# Patient Record
Sex: Female | Born: 1940
Health system: Southern US, Community
[De-identification: ages and names within clinical notes are randomized; demographics above are authoritative.]

## PROBLEM LIST (undated history)

## (undated) DIAGNOSIS — Z86010 Personal history of colonic polyps: Secondary | ICD-10-CM

## (undated) DIAGNOSIS — Z889 Allergy status to unspecified drugs, medicaments and biological substances status: Secondary | ICD-10-CM

## (undated) DIAGNOSIS — M1990 Unspecified osteoarthritis, unspecified site: Secondary | ICD-10-CM

## (undated) DIAGNOSIS — D649 Anemia, unspecified: Secondary | ICD-10-CM

## (undated) DIAGNOSIS — IMO0001 Reserved for inherently not codable concepts without codable children: Secondary | ICD-10-CM

## (undated) DIAGNOSIS — Z5189 Encounter for other specified aftercare: Secondary | ICD-10-CM

## (undated) DIAGNOSIS — I10 Essential (primary) hypertension: Secondary | ICD-10-CM

## (undated) DIAGNOSIS — G250 Essential tremor: Secondary | ICD-10-CM

## (undated) DIAGNOSIS — H409 Unspecified glaucoma: Secondary | ICD-10-CM

## (undated) DIAGNOSIS — R251 Tremor, unspecified: Secondary | ICD-10-CM

## (undated) HISTORY — PX: CHOLECYSTECTOMY: SHX55

## (undated) HISTORY — DX: Personal history of colonic polyps: Z86.010

## (undated) HISTORY — DX: Unspecified glaucoma: H40.9

## (undated) HISTORY — DX: Encounter for other specified aftercare: Z51.89

## (undated) HISTORY — DX: Essential tremor: G25.0

## (undated) HISTORY — PX: JOINT REPLACEMENT: SHX530

## (undated) HISTORY — PX: ABDOMINAL HYSTERECTOMY: SHX81

## (undated) HISTORY — PX: OTHER SURGICAL HISTORY: SHX169

## (undated) HISTORY — PX: COLONOSCOPY: SHX174

---

## 1993-07-08 HISTORY — PX: CERVICAL LAMINECTOMY: SHX94

## 1998-01-13 ENCOUNTER — Ambulatory Visit (HOSPITAL_COMMUNITY): Admission: RE | Admit: 1998-01-13 | Discharge: 1998-01-13 | Payer: Self-pay | Admitting: Family Medicine

## 1998-03-10 ENCOUNTER — Encounter: Payer: Self-pay | Admitting: Emergency Medicine

## 1998-03-10 ENCOUNTER — Emergency Department (HOSPITAL_COMMUNITY): Admission: EM | Admit: 1998-03-10 | Discharge: 1998-03-10 | Payer: Self-pay | Admitting: Emergency Medicine

## 1998-05-04 ENCOUNTER — Ambulatory Visit (HOSPITAL_COMMUNITY): Admission: RE | Admit: 1998-05-04 | Discharge: 1998-05-04 | Payer: Self-pay | Admitting: Family Medicine

## 1998-06-05 ENCOUNTER — Other Ambulatory Visit: Admission: RE | Admit: 1998-06-05 | Discharge: 1998-06-05 | Payer: Self-pay | Admitting: *Deleted

## 1999-06-06 ENCOUNTER — Other Ambulatory Visit: Admission: RE | Admit: 1999-06-06 | Discharge: 1999-06-06 | Payer: Self-pay | Admitting: *Deleted

## 1999-10-12 ENCOUNTER — Encounter: Admission: RE | Admit: 1999-10-12 | Discharge: 1999-10-12 | Payer: Self-pay | Admitting: Family Medicine

## 1999-10-12 ENCOUNTER — Encounter: Payer: Self-pay | Admitting: Family Medicine

## 1999-10-13 ENCOUNTER — Emergency Department (HOSPITAL_COMMUNITY): Admission: EM | Admit: 1999-10-13 | Discharge: 1999-10-14 | Payer: Self-pay | Admitting: Emergency Medicine

## 2000-06-09 ENCOUNTER — Other Ambulatory Visit: Admission: RE | Admit: 2000-06-09 | Discharge: 2000-06-09 | Payer: Self-pay | Admitting: *Deleted

## 2000-09-07 ENCOUNTER — Emergency Department (HOSPITAL_COMMUNITY): Admission: EM | Admit: 2000-09-07 | Discharge: 2000-09-07 | Payer: Self-pay | Admitting: Emergency Medicine

## 2000-10-15 ENCOUNTER — Encounter: Admission: RE | Admit: 2000-10-15 | Discharge: 2000-10-15 | Payer: Self-pay | Admitting: *Deleted

## 2000-10-15 ENCOUNTER — Encounter: Payer: Self-pay | Admitting: *Deleted

## 2001-07-13 ENCOUNTER — Other Ambulatory Visit: Admission: RE | Admit: 2001-07-13 | Discharge: 2001-07-13 | Payer: Self-pay | Admitting: *Deleted

## 2001-10-16 ENCOUNTER — Encounter: Admission: RE | Admit: 2001-10-16 | Discharge: 2001-10-16 | Payer: Self-pay | Admitting: *Deleted

## 2001-10-16 ENCOUNTER — Encounter: Payer: Self-pay | Admitting: *Deleted

## 2002-07-20 ENCOUNTER — Other Ambulatory Visit: Admission: RE | Admit: 2002-07-20 | Discharge: 2002-07-20 | Payer: Self-pay | Admitting: *Deleted

## 2002-10-21 ENCOUNTER — Encounter: Payer: Self-pay | Admitting: *Deleted

## 2002-10-21 ENCOUNTER — Encounter: Admission: RE | Admit: 2002-10-21 | Discharge: 2002-10-21 | Payer: Self-pay | Admitting: *Deleted

## 2003-08-03 ENCOUNTER — Other Ambulatory Visit: Admission: RE | Admit: 2003-08-03 | Discharge: 2003-08-03 | Payer: Self-pay | Admitting: *Deleted

## 2003-10-25 ENCOUNTER — Encounter: Admission: RE | Admit: 2003-10-25 | Discharge: 2003-10-25 | Payer: Self-pay | Admitting: *Deleted

## 2004-07-12 ENCOUNTER — Ambulatory Visit: Payer: Self-pay | Admitting: Internal Medicine

## 2004-07-23 ENCOUNTER — Other Ambulatory Visit: Admission: RE | Admit: 2004-07-23 | Discharge: 2004-07-23 | Payer: Self-pay | Admitting: *Deleted

## 2004-08-01 ENCOUNTER — Ambulatory Visit: Payer: Self-pay | Admitting: Internal Medicine

## 2004-10-25 ENCOUNTER — Encounter: Admission: RE | Admit: 2004-10-25 | Discharge: 2004-10-25 | Payer: Self-pay | Admitting: *Deleted

## 2005-01-16 ENCOUNTER — Inpatient Hospital Stay (HOSPITAL_COMMUNITY): Admission: RE | Admit: 2005-01-16 | Discharge: 2005-01-20 | Payer: Self-pay | Admitting: Orthopedic Surgery

## 2005-07-31 ENCOUNTER — Other Ambulatory Visit: Admission: RE | Admit: 2005-07-31 | Discharge: 2005-07-31 | Payer: Self-pay | Admitting: *Deleted

## 2005-09-23 ENCOUNTER — Encounter: Admission: RE | Admit: 2005-09-23 | Discharge: 2005-09-23 | Payer: Self-pay | Admitting: Orthopedic Surgery

## 2005-10-28 ENCOUNTER — Encounter: Admission: RE | Admit: 2005-10-28 | Discharge: 2005-10-28 | Payer: Self-pay | Admitting: *Deleted

## 2006-10-31 ENCOUNTER — Encounter: Admission: RE | Admit: 2006-10-31 | Discharge: 2006-10-31 | Payer: Self-pay | Admitting: *Deleted

## 2007-11-03 ENCOUNTER — Encounter: Admission: RE | Admit: 2007-11-03 | Discharge: 2007-11-03 | Payer: Self-pay | Admitting: *Deleted

## 2008-11-03 ENCOUNTER — Encounter: Admission: RE | Admit: 2008-11-03 | Discharge: 2008-11-03 | Payer: Self-pay | Admitting: Internal Medicine

## 2009-11-06 ENCOUNTER — Encounter: Admission: RE | Admit: 2009-11-06 | Discharge: 2009-11-06 | Payer: Self-pay | Admitting: Internal Medicine

## 2010-01-22 ENCOUNTER — Inpatient Hospital Stay (HOSPITAL_COMMUNITY): Admission: RE | Admit: 2010-01-22 | Discharge: 2010-01-26 | Payer: Self-pay | Admitting: Orthopedic Surgery

## 2010-09-22 LAB — CBC
HCT: 25.8 % — ABNORMAL LOW (ref 36.0–46.0)
HCT: 26.4 % — ABNORMAL LOW (ref 36.0–46.0)
HCT: 28.6 % — ABNORMAL LOW (ref 36.0–46.0)
Hemoglobin: 8.3 g/dL — ABNORMAL LOW (ref 12.0–15.0)
Hemoglobin: 8.8 g/dL — ABNORMAL LOW (ref 12.0–15.0)
MCH: 23.6 pg — ABNORMAL LOW (ref 26.0–34.0)
MCH: 26.2 pg (ref 26.0–34.0)
MCHC: 32.3 g/dL (ref 30.0–36.0)
MCV: 77.7 fL — ABNORMAL LOW (ref 78.0–100.0)
Platelets: 160 10*3/uL (ref 150–400)
RBC: 3.68 MIL/uL — ABNORMAL LOW (ref 3.87–5.11)
RBC: 4.18 MIL/uL (ref 3.87–5.11)
RDW: 15.4 % (ref 11.5–15.5)
RDW: 18.9 % — ABNORMAL HIGH (ref 11.5–15.5)
RDW: 20 % — ABNORMAL HIGH (ref 11.5–15.5)
WBC: 10.8 10*3/uL — ABNORMAL HIGH (ref 4.0–10.5)
WBC: 14.2 10*3/uL — ABNORMAL HIGH (ref 4.0–10.5)
WBC: 15.4 10*3/uL — ABNORMAL HIGH (ref 4.0–10.5)

## 2010-09-22 LAB — TYPE AND SCREEN

## 2010-09-22 LAB — BASIC METABOLIC PANEL
BUN: 11 mg/dL (ref 6–23)
CO2: 30 mEq/L (ref 19–32)
Chloride: 101 mEq/L (ref 96–112)
Chloride: 98 mEq/L (ref 96–112)
Creatinine, Ser: 1.34 mg/dL — ABNORMAL HIGH (ref 0.4–1.2)
GFR calc Af Amer: 47 mL/min — ABNORMAL LOW (ref >60)
GFR calc Af Amer: 52 mL/min — ABNORMAL LOW (ref >60)
GFR calc non Af Amer: 39 mL/min — ABNORMAL LOW (ref >60)
GFR calc non Af Amer: 43 mL/min — ABNORMAL LOW (ref >60)
Glucose, Bld: 149 mg/dL — ABNORMAL HIGH (ref 70–99)
Potassium: 3.7 mEq/L (ref 3.5–5.1)
Potassium: 4.2 mEq/L (ref 3.5–5.1)

## 2010-09-22 LAB — PROTIME-INR
INR: 1.07 (ref 0.00–1.49)
INR: 2.95 — ABNORMAL HIGH (ref 0.00–1.49)
Prothrombin Time: 20.7 seconds — ABNORMAL HIGH (ref 11.6–15.2)
Prothrombin Time: 30.5 seconds — ABNORMAL HIGH (ref 11.6–15.2)

## 2010-09-22 LAB — URINALYSIS, ROUTINE W REFLEX MICROSCOPIC
Glucose, UA: NEGATIVE mg/dL
Hgb urine dipstick: NEGATIVE
Protein, ur: NEGATIVE mg/dL
pH: 6.5 (ref 5.0–8.0)

## 2010-09-22 LAB — URINE CULTURE: Special Requests: NEGATIVE

## 2010-09-23 LAB — URINALYSIS, ROUTINE W REFLEX MICROSCOPIC
Glucose, UA: NEGATIVE mg/dL
Hgb urine dipstick: NEGATIVE
Ketones, ur: NEGATIVE mg/dL
Protein, ur: NEGATIVE mg/dL
Urobilinogen, UA: 0.2 mg/dL (ref 0.0–1.0)

## 2010-09-23 LAB — CBC
HCT: 35 % — ABNORMAL LOW (ref 36.0–46.0)
MCHC: 32.3 g/dL (ref 30.0–36.0)
Platelets: 230 10*3/uL (ref 150–400)
RDW: 16.1 % — ABNORMAL HIGH (ref 11.5–15.5)
WBC: 6.2 10*3/uL (ref 4.0–10.5)

## 2010-09-23 LAB — COMPREHENSIVE METABOLIC PANEL
ALT: 35 U/L (ref 0–35)
Albumin: 3.9 g/dL (ref 3.5–5.2)
Alkaline Phosphatase: 55 U/L (ref 39–117)
Calcium: 9.1 mg/dL (ref 8.4–10.5)
GFR calc Af Amer: 52 mL/min — ABNORMAL LOW (ref >60)
Glucose, Bld: 106 mg/dL — ABNORMAL HIGH (ref 70–99)
Potassium: 3.9 mEq/L (ref 3.5–5.1)
Sodium: 141 mEq/L (ref 135–145)
Total Protein: 7.5 g/dL (ref 6.0–8.3)

## 2010-09-23 LAB — SURGICAL PCR SCREEN
MRSA, PCR: NEGATIVE
Staphylococcus aureus: NEGATIVE

## 2010-09-23 LAB — PROTIME-INR: Prothrombin Time: 13 seconds (ref 11.6–15.2)

## 2010-10-16 ENCOUNTER — Other Ambulatory Visit: Payer: Self-pay | Admitting: Obstetrics and Gynecology

## 2010-10-16 DIAGNOSIS — Z1231 Encounter for screening mammogram for malignant neoplasm of breast: Secondary | ICD-10-CM

## 2010-11-08 ENCOUNTER — Ambulatory Visit
Admission: RE | Admit: 2010-11-08 | Discharge: 2010-11-08 | Disposition: A | Discharge status: Home / Self Care | Payer: BC Managed Care – PPO | Source: Ambulatory Visit | Attending: Obstetrics and Gynecology | Admitting: Obstetrics and Gynecology

## 2010-11-08 DIAGNOSIS — Z1231 Encounter for screening mammogram for malignant neoplasm of breast: Secondary | ICD-10-CM

## 2010-11-23 NOTE — H&P (Signed)
NAMEJAZYIAH, Jenna Howe              ACCOUNT NO.:  [id number]   MEDICAL RECORD NO.:  [id number]          PATIENT TYPE:  INP   LOCATION:  NA                           FACILITY:  [facility]   PHYSICIAN:  Ollen Gross, M.D.    DATE OF BIRTH:  1941/07/08   DATE OF ADMISSION:  01/16/2005  DATE OF DISCHARGE:                                HISTORY & PHYSICAL   DATE OF OFFICE VISIT HISTORY AND PHYSICAL:  January 03, 2005.   CHIEF COMPLAINT:  Left knee pain.   HISTORY OF PRESENT ILLNESS:  A 70 year old female with a longstanding  history of left knee problems which have been much more progressive since  December of this past year. She is having much more pain and limiting her  activities and mobility. She had a previous scope years ago by Dr. Chaney Malling  and recently seen Dr. Sherlean Foot and undergone a knee injection which only  provided temporary relief. She follows up with Dr. Lequita Halt, found to have  severe end-stage tricompartmental changes throughout the knee. It is felt  she could benefit from a knee replacement. The risks and benefits have been  discussed. The patient subsequently admitted to the hospital. She has been  seen by Dr. Margrett Rud, felt to be in average health with an average  surgical risk. The patient subsequently admitted to the hospital.   ALLERGIES:  PENICILLIN, SULFA, CELEBREX.   CURRENT MEDICATIONS:  1.  Premarin 0.625 mg daily.  2.  Triamterene hydrochlorothiazide 75/50 daily.  3.  Diclofenac 50 mg, 1-2 a day.  4.  Travatan opthalmic solution drops daily.  5.  Loratadine 10 mg daily.  6.  Also on an iron supplement.   PAST MEDICAL HISTORY:  1.  Glaucoma.  2.  Lower extremity dependent edema.  3.  Arthritis.   PAST SURGICAL HISTORY:  Vaginal hysterectomy in 1988, laparoscopic with  fulguration of pelvis. Cervical laminectomy diskectomy 1988, gallbladder  surgery in 1990's, left knee scope in the 80's.   SOCIAL HISTORY:  Married, retired, nonsmoker, no alcohol, one  daughter. Two  steps entering into her home.   FAMILY HISTORY:  Mother deceased age 60 with a history of hypertension,  stroke, diabetes.   REVIEW OF SYMPTOMS:  GENERAL:  No fever, chills, night sweats. NEURO:  No  seizure, syncope or paralysis. Does have glaucoma. RESPIRATORY:  No  shortness of breath, productive cough or hemoptysis. CARDIOVASCULAR:  No  chest pain, angina, orthopnea. GI:  No nausea, vomiting, diarrhea,  constipation. GU:  No dysuria, hematuria or discharge . MUSCULOSKELETAL:  Left knee with arthritis and lower extremity edema.   PHYSICAL EXAMINATION:  VITAL SIGNS:  Pulse 68, respirations 12, blood  pressure 168/78.  GENERAL:  A 70 year old African-American female, well-nourished, well-  developed, in no acute distress accompanied by her husband. Alert and  oriented in cooperative, very pleasant.  HEENT:  Normocephalic, atraumatic. Pupils round and reactive. Oropharynx  clear. EOM's intact. She does have upper partial denture plate noted.  NECK:  Supple, no carotid bruits.  CHEST:  Clear anterior and posterior chest.  LUNGS:  No rhonchi, rales or  wheezes.  HEART:  Regular rate and rhythm. No murmur.  ABDOMEN:  Soft, nontender, bowel sounds present.  RECTAL/BREASTS/GENITALIA:  Not done not pertinent to present illness.  EXTREMITIES:  Left knee. Range of motion 5-90 degrees, moderate crepitus  noted, no effusion, no instability.   IMPRESSION:  1.  Osteoarthritis, left knee.  2.  Glaucoma.  3.  Lower extremity swelling.   PLAN:  The patient admitted to [facility] to undergo left total  knee arthroplasty. The surgery will be performed by Dr. Ollen Gross. Her  medical doctor is Dr. Andrey Campanile who will be notified of her room on admission  and be contacted if needed for medical assistance with the patient  throughout the hospital course.       ALP/MEDQ  D:  01/16/2005  T:  01/16/2005  Job:  [id number]   cc:   Vale [place]. Andrey Campanile, M.D.  [address]  [place]  [place] [address]  Fax: [phone]

## 2010-11-23 NOTE — Op Note (Signed)
NAMERUTHIE, Howe NO.:  [id number]   MEDICAL RECORD NO.:  [id number]          PATIENT TYPE:  INP   LOCATION:  0011                         FACILITY:  [facility]   PHYSICIAN:  Ollen Gross, M.D.    DATE OF BIRTH:  1940-11-04   DATE OF PROCEDURE:  01/16/2005  DATE OF DISCHARGE:                                 OPERATIVE REPORT   PREOPERATIVE DIAGNOSIS:  Osteoarthritis left knee.   POSTOPERATIVE DIAGNOSIS:  Osteoarthritis left knee.   PROCEDURE:  Left total knee arthroplasty.   SURGEON:  Ollen Gross, M.D.   ASSISTANT:  Avel Peace, P.A.-C.   ANESTHESIA:  Spinal.   ESTIMATED BLOOD LOSS:  Minimal.   DRAINS:  Hemovac x1.   COMPLICATIONS:  None.   TOURNIQUET TIME:  37 minutes at 300 mmHg.   CONDITION:  Stable to recovery.   BRIEF CLINICAL NOTE:  Jenna Howe is a 70 year old female with end-stage  arthritic change of the left knee with intractable pain. She has failed  nonoperative management and presents now for left total knee arthroplasty.   PROCEDURE IN DETAIL:  After successful administration of spinal anesthetic,  a tourniquet was placed high on the left thigh and left lower extremity  prepped and draped in the usual sterile fashion. Extremities wrapped in  Esmarch, knee flexed, and tourniquet inflated to 300 mmHg. A standard  midline incision was made with a 10 blade through the subcutaneous tissue to  the level of the extensor mechanism. A fresh blade was used to make a medial  parapatellar arthrotomy and then the soft tissue over the proximal medial  tibia subperiosteally elevated to the joint line with a knife and into the  semimembranosus bursa with a Cobb elevator.  The soft tissue over the proximal and lateral tibia is also elevated with  attention being paid to avoiding the patellar tendon on the tibial tubercle.  The patella is everted, knee flexed 90 degrees, and ACL and PCL removed. A  drill was used to create a starting hole in the  distal femur, canal was  irrigated. A 5 degree left valgus alignment guide is placed and rotating off  the posterior condyles rotation is marked and a block pinned to remove 10 mm  off the distal femur. Distal femoral resection is made with an oscillating  saw. A sizing block is placed and size four is most appropriate for the  femoral component. Rotations marked at the epicondylar axis and the size  four cutting block is placed. The anterior, posterior and chamfer cuts are  subsequently made.   The tibia is subluxed forward and the menisci are removed. Extramedullary  tibial alignment guide is placed referencing proximally at the medial aspect  of tibial tubercle and distally along the second metatarsal axis and tibial  crest. The block is pinned to remove 10 mm off the nondeficient lateral  side. Tibial resection is made with an oscillating saw. A size three is the  most trocar appropriate tibial component and then the proximal tibia is  prepared with the modular drill and keel punch for a size three. Femoral  preparation  is completed with the intercondylar cut for the size four.   A size three mobile bearing tibial trial with a size four posterior  stabilized femoral trial and a 10 mm posterior stabilized rotating platform  insert trial are placed. With the 10, full extension was achieved with  excellent varus and valgus balance throughout full range of motion. The  patella is then everted and the thickness measured to be 24 mm. Freehand  resection is taken to 15 mm, a 38 template is placed, lug holes were  drilled, trial patella was placed and it tracks normally. Osteophytes are  then removed off the posterior femur with the trial in place. All trials  were removed and the cut bone surfaces are prepared with pulsatile lavage.  Cement is mixed and once ready for implantation, the size three mobile  bearing tibial tray, size four posterior stabilized femur and 38 patella are  cemented  into place and the patella is held with a clamp. A trial 10 mm  insert is placed, knee held in full extension and all extruded cement  removed. Once the cement is fully hardened then the permanent 10 mm  posterior stabilized rotating platform insert is placed into the tibial  tray. The wound is copiously irrigated with saline solution and the extensor  mechanism closed over a Hemovac drain with interrupted #1 PDS. The  tourniquet was released with a total time of 38 minutes. Flexion against  gravity is about 110 degrees. This is far greater than her preop flexion of  85 degrees. The subcu is closed with interrupted 2-0 Vicryl and subcuticular  running 4-0 Monocryl. Incision is cleaned and dried and Steri-Strips  applied. Drains hooked to suction and bulky sterile dressing is applied and  she is placed into a knee immobilizer, awakened and transported to recovery  in stable condition.       FA/MEDQ  D:  01/16/2005  T:  01/17/2005  Job:  [id number]

## 2010-11-23 NOTE — Discharge Summary (Signed)
Jenna Howe, Jenna Howe              ACCOUNT NO.:  [id number]   MEDICAL RECORD NO.:  [id number]          PATIENT TYPE:  INP   LOCATION:  1608                         FACILITY:  [facility]   PHYSICIAN:  Ollen Gross, M.D.    DATE OF BIRTH:  05-Oct-1940   DATE OF ADMISSION:  01/16/2005  DATE OF DISCHARGE:  01/20/2005                                 DISCHARGE SUMMARY   ADMISSION DIAGNOSES:  1.  Osteoarthritis left knee.  2.  Glaucoma.  3.  Lower extremity swelling.   DISCHARGE DIAGNOSES:  1.  Osteoarthritis left knee status post left total knee arthroplasty.  2.  Mild postoperative blood loss anemia, did not require transfusion.  3.  Glaucoma.  4.  Lower extremity swelling.  5.  Postoperative hypokalemia, improved.   PROCEDURE:  January 16, 2005 left total knee arthroplasty, surgeon Ollen Gross, M.D., assistant Avel Peace, P.A.-C.  Spinal anesthesia, minimal  blood loss, hemovac drain x1. Tourniquet time 37 minutes at 300 mmHg.   CONSULTATIONS:  None.   BRIEF HISTORY:  Jenna Howe is a 70 year old female with end-stage arthritis  of the left knee with intractable pain who failed nonoperative management  and now presents for total knee arthroplasty.   LABORATORY DATA:  CBC on admission showed a hemoglobin of 11.3, hematocrit  of 34.8, white cell count of 6.3, red cell count 4.8, and differential  within normal limits. Postoperative hemoglobin 9.4 and drifted down to 8.5,  last noted 8.2 and 24.5. PT and PTT 12.5 and 31 respectively on admission  with an INR of 0.9. Serial pro times followed. Last noted PT/INR 28.5 and  2.7. Chem panel on admission all within normal limits. Serial BMETs are  followed. Sodium dropped from 139 to 135, last noted at 133. Potassium  dropped from 3.6 to 3.3 back up to 4.5. Urinalysis negative. Blood group  type O positive.   EKG dated January 14, 2005 normal sinus rhythm, normal EKG. This is an  unconfirmed EKG. Two view chest January 14, 2005 no acute  cardiopulmonary  process.   HOSPITAL COURSE:  The patient admitted to [facility], underwent  above procedure, tolerated it well, started on PC and p.o. analgesics. Drain  placed at the time of surgery was pulled on day one. Doing pretty well on  day one but the biggest complaint was some itching, recommended Benadryl but  she reacted adversely to Benadryl. She was given Atarax for the itching, PCA  was switched over. Started getting up out of bed by day two. Her hemoglobin  had dropped down to 8.5 but she was asymptomatic. She started to get up with  physical therapy. PCA and fluids were discontinued on day two. Heplock IV,  started on iron supplement. From a therapy standpoint, started to progress  with physical therapy, up ambulating short distances and increased up to 60  and 100 feet by day two. By day three, she was up ambulating 120 to 200  feet. Hemoglobin had dropped a little further from 8.5 to 8.2; however, she  was ambulating up to 200 feet and she was asymptomatic on  January 19, 2005. She  was held until the following day of January 20, 2005. The patient was  comfortable, only had a scant amount of drainage from previous hemovac site.  The incision was healing well. She was ambulating well, had no complaints  and was discharged home.   PLAN:  1.  The patient discharged home on January 20, 2005.  2.  Discharge diagnoses please see above.  3.  Discharge meds:  Coumadin, Percocet, Robaxin, Trinsicon.  4.  Diet:  Continue current home diet.  5.  Activity:  She is weightbearing as tolerated, total knee protocol, home      health PT, home health nursing. Total knee ambulation, ADL's as per PT.  6.  Followup two weeks from surgery. Call the office for an appointment at      [phone].   DISPOSITION:  Home.   CONDITION ON DISCHARGE:  Improving.       ALP/MEDQ  D:  01/25/2005  T:  01/25/2005  Job:  [id number]   cc:   [place]ale [place]. Andrey Campanile, M.D.  [address]  [place]   [place] [address]  Fax: [phone]

## 2011-04-26 ENCOUNTER — Other Ambulatory Visit: Payer: Self-pay | Admitting: Orthopedic Surgery

## 2011-04-26 ENCOUNTER — Ambulatory Visit (HOSPITAL_COMMUNITY)
Admission: RE | Admit: 2011-04-26 | Discharge: 2011-04-26 | Disposition: A | Discharge status: Home / Self Care | Payer: Medicare Other | Source: Ambulatory Visit | Attending: Orthopedic Surgery | Admitting: Orthopedic Surgery

## 2011-04-26 ENCOUNTER — Other Ambulatory Visit (HOSPITAL_COMMUNITY): Payer: Self-pay | Admitting: Orthopedic Surgery

## 2011-04-26 ENCOUNTER — Encounter (HOSPITAL_COMMUNITY): Payer: Medicare Other

## 2011-04-26 DIAGNOSIS — Z01810 Encounter for preprocedural cardiovascular examination: Secondary | ICD-10-CM | POA: Insufficient documentation

## 2011-04-26 DIAGNOSIS — Z01818 Encounter for other preprocedural examination: Secondary | ICD-10-CM

## 2011-04-26 DIAGNOSIS — M24669 Ankylosis, unspecified knee: Secondary | ICD-10-CM | POA: Insufficient documentation

## 2011-04-26 DIAGNOSIS — Z01811 Encounter for preprocedural respiratory examination: Secondary | ICD-10-CM | POA: Insufficient documentation

## 2011-04-26 DIAGNOSIS — Z01812 Encounter for preprocedural laboratory examination: Secondary | ICD-10-CM | POA: Insufficient documentation

## 2011-04-26 LAB — COMPREHENSIVE METABOLIC PANEL
ALT: 15 U/L (ref 0–35)
AST: 23 U/L (ref 0–37)
CO2: 32 mEq/L (ref 19–32)
Chloride: 100 mEq/L (ref 96–112)
Creatinine, Ser: 1.08 mg/dL (ref 0.50–1.10)
GFR calc Af Amer: 58 mL/min — ABNORMAL LOW (ref >90)
GFR calc non Af Amer: 50 mL/min — ABNORMAL LOW (ref >90)
Glucose, Bld: 102 mg/dL — ABNORMAL HIGH (ref 70–99)
Sodium: 139 mEq/L (ref 135–145)
Total Bilirubin: 0.2 mg/dL — ABNORMAL LOW (ref 0.3–1.2)

## 2011-04-26 LAB — CBC
Hemoglobin: 11.8 g/dL — ABNORMAL LOW (ref 12.0–15.0)
MCV: 70.4 fL — ABNORMAL LOW (ref 78.0–100.0)
Platelets: 236 10*3/uL (ref 150–400)
RBC: 5.2 MIL/uL — ABNORMAL HIGH (ref 3.87–5.11)
WBC: 6.6 10*3/uL (ref 4.0–10.5)

## 2011-04-26 LAB — URINALYSIS, ROUTINE W REFLEX MICROSCOPIC
Bilirubin Urine: NEGATIVE
Glucose, UA: NEGATIVE mg/dL
Hgb urine dipstick: NEGATIVE
Specific Gravity, Urine: 1.01 (ref 1.005–1.030)
pH: 6 (ref 5.0–8.0)

## 2011-04-26 LAB — SURGICAL PCR SCREEN: Staphylococcus aureus: NEGATIVE

## 2011-04-29 ENCOUNTER — Inpatient Hospital Stay (HOSPITAL_COMMUNITY)
Admission: RE | Admit: 2011-04-29 | Discharge: 2011-05-03 | DRG: 467 | Disposition: A | Discharge status: Home Health | Payer: Medicare Other | Source: Ambulatory Visit | Attending: Orthopedic Surgery | Admitting: Orthopedic Surgery

## 2011-04-29 DIAGNOSIS — R609 Edema, unspecified: Secondary | ICD-10-CM | POA: Diagnosis present

## 2011-04-29 DIAGNOSIS — Y831 Surgical operation with implant of artificial internal device as the cause of abnormal reaction of the patient, or of later complication, without mention of misadventure at the time of the procedure: Secondary | ICD-10-CM | POA: Diagnosis present

## 2011-04-29 DIAGNOSIS — D62 Acute posthemorrhagic anemia: Secondary | ICD-10-CM | POA: Diagnosis not present

## 2011-04-29 DIAGNOSIS — H409 Unspecified glaucoma: Secondary | ICD-10-CM | POA: Diagnosis present

## 2011-04-29 DIAGNOSIS — I8390 Asymptomatic varicose veins of unspecified lower extremity: Secondary | ICD-10-CM | POA: Diagnosis present

## 2011-04-29 DIAGNOSIS — Z96659 Presence of unspecified artificial knee joint: Secondary | ICD-10-CM

## 2011-04-29 DIAGNOSIS — T8489XA Other specified complication of internal orthopedic prosthetic devices, implants and grafts, initial encounter: Principal | ICD-10-CM | POA: Diagnosis present

## 2011-04-29 DIAGNOSIS — H269 Unspecified cataract: Secondary | ICD-10-CM | POA: Diagnosis present

## 2011-04-29 DIAGNOSIS — Z01810 Encounter for preprocedural cardiovascular examination: Secondary | ICD-10-CM

## 2011-04-29 DIAGNOSIS — Z880 Allergy status to penicillin: Secondary | ICD-10-CM

## 2011-04-29 DIAGNOSIS — I10 Essential (primary) hypertension: Secondary | ICD-10-CM | POA: Diagnosis present

## 2011-04-29 DIAGNOSIS — Z79899 Other long term (current) drug therapy: Secondary | ICD-10-CM

## 2011-04-29 DIAGNOSIS — M24569 Contracture, unspecified knee: Secondary | ICD-10-CM | POA: Diagnosis present

## 2011-04-29 DIAGNOSIS — Z01812 Encounter for preprocedural laboratory examination: Secondary | ICD-10-CM

## 2011-04-29 DIAGNOSIS — M25669 Stiffness of unspecified knee, not elsewhere classified: Secondary | ICD-10-CM | POA: Diagnosis present

## 2011-04-30 LAB — BASIC METABOLIC PANEL
BUN: 16 mg/dL (ref 6–23)
GFR calc Af Amer: 66 mL/min — ABNORMAL LOW (ref >90)
GFR calc non Af Amer: 57 mL/min — ABNORMAL LOW (ref >90)
Potassium: 3.5 mEq/L (ref 3.5–5.1)

## 2011-04-30 LAB — CBC
HCT: 29.4 % — ABNORMAL LOW (ref 36.0–46.0)
MCHC: 32.3 g/dL (ref 30.0–36.0)
RDW: 15.2 % (ref 11.5–15.5)

## 2011-05-01 LAB — CBC
Hemoglobin: 8.6 g/dL — ABNORMAL LOW (ref 12.0–15.0)
MCHC: 32.2 g/dL (ref 30.0–36.0)
RDW: 15.3 % (ref 11.5–15.5)

## 2011-05-01 LAB — BASIC METABOLIC PANEL
GFR calc Af Amer: 58 mL/min — ABNORMAL LOW (ref >90)
GFR calc non Af Amer: 50 mL/min — ABNORMAL LOW (ref >90)
Glucose, Bld: 101 mg/dL — ABNORMAL HIGH (ref 70–99)
Potassium: 3.6 mEq/L (ref 3.5–5.1)
Sodium: 137 mEq/L (ref 135–145)

## 2011-05-01 NOTE — Op Note (Addendum)
Jenna Howe, SANDELL NO.:  [id number]  MEDICAL RECORD NO.:  [id number]  LOCATION:  1619                         FACILITY:  [facility]  PHYSICIAN:  Ollen Gross, M.D.    DATE OF BIRTH:  07-24-1940  DATE OF PROCEDURE:  04/29/2011 DATE OF DISCHARGE:                              OPERATIVE REPORT   PREOPERATIVE DIAGNOSIS:  Right knee arthrofibrosis with flexion contracture.  POSTOPERATIVE DIAGNOSIS:  Right knee arthrofibrosis with flexion contracture.  PROCEDURE:  Right knee arthrotomy, scar excision, and femoral component revision.  SURGEON:  Ollen Gross, M.D.  ASSISTANT:  Alexzandrew L. Perkins, P.A.C.  ANESTHESIA:  Spinal.  ESTIMATED BLOOD LOSS:  Minimal.  DRAINS:  Hemovac x1.  TOURNIQUET TIME:  56 minutes at 300 mmHg.  COMPLICATIONS:  None.  CONDITION:  Stable to recovery.  BRIEF CLINICAL INDICATION:  Ms. Spurling is a 70 year old female who had a right total knee arthroplasty done over a year ago.  She has had difficulty with range of motion, and initially had a close manipulation which helped with the flexion, but she has had about a 15-degree extension contracture which did not respond to long-term physical therapy over 3 months and also did not respond to JAS brace.  Given it a year and still does not have return of the motion, she has a significant functional limp which is leading to pain in her hip and back.  We had discussed surgical treatment which would include an arthrotomy and scar excision.  I was able to get full extension without changing component. We would do that, but if we cannot extension, then we had to revise the femoral component raise the joint line to open the extension space.  We discussed this in detail and she elected to proceed today.  PROCEDURE IN DETAIL:  After successful administration of spinal anesthetic, a tourniquet placed high on her right thigh and right lower extremity was prepped and draped in usual sterile  fashion.  Extremities wrapped in Esmarch, knee flexed, tourniquet inflated to 300 mmHg. Midline incision was made with a #10 blade through subcu tissue to the level of the extensor mechanism.  A fresh blade was used to make a medial arthrotomy.  I did not encounter any fluid in the joint.  She had thick scar.  We did the scar excision in the medial and lateral gutter suprapatellar area.  I then elevated the soft tissue on the proximal medial tibia to the joint line with the knife and into the semimembranosus bursa with a Cobb elevator.  I subluxed the patella laterally.  The knee flexion was improved significantly.  I was able to achieve about 110 degrees of flexion.  She still lacked 10 to 15 degrees of extension.  I then took out the tibial polyethylene which was only 10 mm stick which was the minimal thickness.  I felt there was no way, would be able to achieve full extension without formally revising the femoral component and raising the joint line about 3 to 4 mm.  I then used osteotomes to disrupt the interface between the femoral component and bone.  The components removed with minimal bone loss, but the bone was very soft underneath.  I decided I have use a stemmed component in order to bypass some of the soft bone.  We then cleaned out the femoral canal of all scar tissue.  The cut bone surfaces were then thoroughly irrigated.  We then used the 5 degree right valgus alignment guide and removed 3 mm off the distal femur.  This was a size 4 that was on previously.  The AP cutting blocks placed on the epicondylar axis for rotation.  I just needed to take a small amount of bone off the chamfers.  The anterior and posterior cuts were fine.  We then placed intercondylar block and made the intercondylar cut.  I decided to use a 13 x 60 cemented stem.  I placed a trial, 4 narrow femur with a 13 x 60 stem extension in neutral position.  This had excellent fit on the bone. She was very  soft medially.  There was no fracture, but I was concerned that the fracture could potentially occur somewhat to stabilize this.  I decided that once we got the stem cemented in and the components cemented, then we would stabilize this back to the femoral shaft.  With the trial in place, I placed a 10 mm size 4 rotating platform insert trial.  I was able to achieve full extension with excellent balance throughout, full range of motion.  We then opened the components and assembled on the back table which was a 4 narrow posterior stabilized femur with a 13 x 60 stem extension with the bolt in a neutral position for 5 degree right valgus.  This was tightened down and locked into position.  We then thoroughly irrigated the bone with pulsatile lavage and then mixed the cement.  I placed a cement restrictor at the appropriate depth in the femoral canal prior to mixing.  Once we mixed, we then packed the cement into the femoral canal and cemented the component in place.  Once cement was fully hardened, then placed 3 Steinmann pins in cross fashion, 2 parallel and 1 crossed to further stabilize that medial femoral condyle.  It once again did not fracture, but it was somewhat mobile and after the pins were in, it was very stable.  Once the cement was fully hardened, then we placed the permanent 10 mm posterior stabilized rotating platform insert.  Again, full extension achieved.  Excellent balance through full range of motion.  It was copiously irrigated with saline solution and the arthrotomy closed over Hemovac drain with interrupted #1 PDS.  The tourniquet was then released.  Total time was 56 minutes.  Subcu was closed with interrupted 2-0 Vicryl and skin with staples.  Incisions clean and dry, and a bulky sterile dressing applied.  She was then placed into a knee immobilizer, awakened, and transported to the recovery in stable condition.  Please note that surgical assistance was necessary  and was a medical necessity for this procedure in order perform in a safe and expeditious manner.  Surgical assistant was necessary for retraction of ligaments and vital neurovascular structures to prevent injury and also for proper retraction when removing and placing the new prosthetic component to avoid any damage to the bone or underlying soft tissue.     Ollen Gross, M.D.     FA/MEDQ  D:  04/29/2011  T:  04/30/2011  Job:  [id number]  Electronically Signed by Ollen Gross M.D. on 05/01/2011 11:31:40 AM Electronically Signed by Ollen Gross M.D. on 05/01/2011 11:33:35 AM

## 2011-05-02 LAB — CBC
Hemoglobin: 7.3 g/dL — ABNORMAL LOW (ref 12.0–15.0)
RBC: 3.2 MIL/uL — ABNORMAL LOW (ref 3.87–5.11)

## 2011-05-02 LAB — PREPARE RBC (CROSSMATCH)

## 2011-05-03 LAB — CBC
HCT: 29.2 % — ABNORMAL LOW (ref 36.0–46.0)
MCV: 72.5 fL — ABNORMAL LOW (ref 78.0–100.0)
Platelets: 176 10*3/uL (ref 150–400)
RBC: 4.03 MIL/uL (ref 3.87–5.11)
WBC: 11 10*3/uL — ABNORMAL HIGH (ref 4.0–10.5)

## 2011-05-03 LAB — BASIC METABOLIC PANEL
CO2: 29 mEq/L (ref 19–32)
Chloride: 102 mEq/L (ref 96–112)
Potassium: 3.8 mEq/L (ref 3.5–5.1)
Sodium: 138 mEq/L (ref 135–145)

## 2011-05-03 LAB — TYPE AND SCREEN
ABO/RH(D): O POS
Antibody Screen: NEGATIVE
Unit division: 0

## 2011-05-10 NOTE — Discharge Summary (Signed)
Jenna Howe, Jenna Howe              ACCOUNT NO.:  [id number]  MEDICAL RECORD NO.:  [id number]  LOCATION:  1619                         FACILITY:  [facility]  PHYSICIAN:  Alexzandrew L. Perkins, P.A.C.DATE OF BIRTH:  Apr 03, 1941  DATE OF ADMISSION:  04/29/2011 DATE OF DISCHARGE:  05/03/2011                              DISCHARGE SUMMARY   ADMITTING DIAGNOSES: 1. Right knee arthrofibrosis with flexion contracture. 2. Right total knee arthroplasty in July, 2011. 3. Glaucoma. 4. Cataracts. 5. Hypertension. 6. Varicose veins. 7. Lower extremity dependent edema.  DISCHARGE DIAGNOSES: 1. Right knee arthrofibrosis, flexion contracture, status post right     knee arthrotomy with scar excision femoral component revision. 2. Postoperative acute blood loss anemia. 3. Status post transfusion without sequelae. 4. Right total knee arthroplasty in July, 2011. 5. Glaucoma. 6. Cataracts. 7. Hypertension. 8. Varicose veins. 9. Lower extremity dependent edema.  PROCEDURE:  On April 29, 2011, right knee arthrotomy with scar excision and femoral component revision.  SURGEON:  Ollen Gross, M.D.  ASSISTANT:  Alexzandrew L. Perkins, P.A.C.  ANESTHESIA:  Spinal anesthesia.  TOURNIQUET TIME:  56 minutes.  CONSULTS:  None.  BRIEF HISTORY:  The patient is a 70 year old female with a right total knee, done back in July 2011.  She has had difficulty range of motion. Then she had a closed manipulation, which helped with flexion, but has about a 15+ degree flexion contracture and has not responded to long- term physical therapy for at least over 3 months and did not respond to the JAS brace, has been given a year, still does not have return of motion. Had significant functional limp, relating to pain in her hip and back.  We discussed surgical treatment including arthrotomy scar excision and she is planning to proceed with the above procedure.  LABORATORY DATA:  CBC on admission, hemoglobin  11.8, hematocrit 36.6, white cell count 6.6, platelets 236.  PT/INR 13.41 with a PTT of 36. Chemistry panel on admission, slightly low total bilirubin of 0.2. Remaining chemistry panel within normal limits.  Preop UA negative. Blood group type, O positive.  Nasal swabs were negative.  Staph aureus negative for MRSA.  Serial CBCs were followed.  Hemoglobin dropped down to 9.5 and 8.6, got as low as 7.3.  Did receive blood.  Post transfusion hemoglobin 9.6, with a hematocrit of 29.2.  White count went up initially from 6.6 up to 13.5, back down to 11. She will be mostly followed throughout the hospital course.  Electrolytes remained within normal limits.  HOSPITAL COURSE:  The patient admitted to [facility], taken to OR, underwent above-stated procedure without complication.  The patient tolerated the procedure well, later transferred to recovery room of orthopedic floor, started on p.o. and IV analgesic pain following surgery, doing fairly well.  On the morning of day 1, hemoglobin is down to 9.5.  She had good urine output.  Hemovac drain placed on the surgery was pulled on day 1.  Started getting up and walking short distances, just within the room.  She is placed on Nu-Iron for the low hemoglobin. By day 2, she was doing a little bit better.  Pain was under better control.  Hemoglobin is  down to 8.6.  She is asymptomatic at this point. Incision looked good and was changing the dressing.  Continued to work with her own therapy.  She was up walking about 15 to 20 feet.  By day 3, unfortunately she gets some lightheadedness on the evening of day 2 and until the morning of day 3, her hemoglobin went down further down to 7.3.  We are concerned about her functional stability and also becoming hemodynamically unstable with the symptoms.  It is felt she would require blood.  We did give her 2 units of blood and held her discharge at that time.  She did receive the blood, started  getting up and walking later on that evening after the blood.  By the morning of day 4, she was doing well, felt better after the blood, had already been up walking in the hallway after completion of the transfusion as long as she did well with the morning therapy, she will be discharged home today.  DISCHARGE PLAN:  The patient discharged home on May 03, 2011.  DISCHARGE DIAGNOSIS:  Please see above.  DISCHARGE MEDICATIONS:  Oxycodone, Robaxin, Xarelto.  Continue home meds of Nu-Iron, primidone, Travatan, loratadine, imatinib, Diovan.  DIET:  Heart healthy diet.  ACTIVITY:  Weightbearing as tolerated.  Total knee protocol.  Follow up in 2 weeks.  She will follow up on Tuesday, May 14, 2011.  DISPOSITION/CONDITION ON DISCHARGE:  Improved.     Alexzandrew L. Perkins, P.A.C.     ALP/MEDQ  D:  05/03/2011  T:  05/03/2011  Job:  [id number]  Electronically Signed by Patrica Duel P.A.C. on 05/09/2011 10:29:25 AM Electronically Signed by Ollen Gross M.D. on 05/10/2011 09:44:12 AM

## 2011-05-10 NOTE — H&P (Signed)
Jenna Howe, Jenna Howe NO.:  [id number]  MEDICAL RECORD NO.:  [id number]  LOCATION:  1619                         FACILITY:  [facility]  PHYSICIAN:  Ollen Gross, M.D.    DATE OF BIRTH:  1941-01-13  DATE OF ADMISSION:  04/29/2011 DATE OF DISCHARGE:                             HISTORY & PHYSICAL   CHIEF COMPLAINT:  Right knee stiffness/arthrofibrosis.  HISTORY OF PRESENT ILLNESS:  Patient is a 70 year old female, well-known to Dr. Ollen Gross and previously undergone a right total knee back in July 2011.  She initially started out fairly well with her recovery, but had developed some stiffening and she never really got a good extension. She has had progressive stiffness over time and felt to have some arthrofibrosis with reduced motion, which has impacted her functional abilities with regards to the knee.  She has had ample time to try to work on her mobility and range of motion although she continues to have stiffness.  It is felt she would benefit from undergoing revision. Risks and benefits have been discussed.  She elected to proceed with surgery.  No contraindications to the procedure such as ongoing infection or progressive neurological disease.  ALLERGIES: 1. PENICILLIN. 2. SULFA. 3. CELEBREX.  CURRENT MEDICATIONS:  Multivitamin, fish oil, vitamin D, vitamin C, iron complex, Percocet, primidone, Premarin, Travatan, loratadine, bumetanide, Diovan.  PAST MEDICAL HISTORY: 1. Glaucoma. 2. Cataracts. 3. Hypertension. 4. Varicose veins. 5. Lower extremity dependent edema.  PAST SURGICAL HISTORY: 1. Left total knee replacement July, 2006. 2. Right total knee replacement, July 2011. 3. Hysterectomy, 1988. 4. Laparoscopic fulguration of pelvis, 1987. 5. Cervical laminectomy in 1995. 6. Gallstone surgery in 1990. 7. Knee scope in the 80s.  FAMILY HISTORY:  Mother deceased at 30 with hypertension, stroke, diabetes. Father deceased.  SOCIAL HISTORY:   Married, retired, nonsmoker, 1 daughter.  Family will be assisting with care after surgery.  REVIEW OF SYSTEMS:  GENERAL:  No fever, chills, night sweats.  NEURO: No seizure, syncope, or paralysis.  RESPIRATORY:  No shortness of breath, productive cough, or hemoptysis.  CARDIOVASCULAR:  No chest pain, angina, or orthopnea.  GI:  No nausea, vomiting, diarrhea, or constipation.  GU:  No dysuria, hematuria, or discharge. MUSCULOSKELETAL:  Knee pain and stiffness.  PHYSICAL EXAM:  VITAL SIGNS:  Temp 98.3, pulse 99, respirations 16, blood pressure 176/69. GENERAL:  Patient is a 70 year old African American female, well- nourished, well-developed, no acute distress.  Alert, oriented, cooperative. HEENT:  Normocephalic, atraumatic.  Pupils round and reactive.  EOMs intact. NECK:  Supple. CHEST:  Clear, anterior and posterior chest walls.  HEART:  Regular rate and rhythm. ABDOMEN:  Soft, nontender.  Bowel sounds present.  RECTAL:  Not done, not pertinent to present illness. BREASTS:  Not done, not pertinent to present illness. GENITALIA:  Not done, not pertinent to present illness. EXTREMITIES:  Right knee, she has a flexion contracture about 15-20 degrees with limited flexion.  Knee is stable.  Anterior incision is well-healed.  IMPRESSION:  Right knee arthrofibrosis.  PLAN:  Patient admitted to [facility] to undergo open arthrotomy, scar excision and revision if needed.     Alexzandrew L. Julien Girt, P.A.C.  ______________________________ Ollen Gross, M.D.    ALP/MEDQ  D:  05/03/2011  T:  05/03/2011  Job:  [id number]  Electronically Signed by Patrica Duel P.A.C. on 05/09/2011 10:29:09 AM Electronically Signed by Ollen Gross M.D. on 05/10/2011 09:44:14 AM

## 2011-07-03 ENCOUNTER — Encounter (HOSPITAL_COMMUNITY): Payer: Self-pay | Admitting: Pharmacy Technician

## 2011-07-06 ENCOUNTER — Other Ambulatory Visit: Payer: Self-pay | Admitting: Orthopedic Surgery

## 2011-07-08 ENCOUNTER — Encounter (HOSPITAL_COMMUNITY): Payer: Self-pay

## 2011-07-08 ENCOUNTER — Encounter (HOSPITAL_COMMUNITY)
Admission: RE | Admit: 2011-07-08 | Discharge: 2011-07-08 | Disposition: A | Discharge status: Home / Self Care | Payer: Medicare Other | Source: Ambulatory Visit | Attending: Orthopedic Surgery | Admitting: Orthopedic Surgery

## 2011-07-08 HISTORY — DX: Unspecified osteoarthritis, unspecified site: M19.90

## 2011-07-08 HISTORY — DX: Anemia, unspecified: D64.9

## 2011-07-08 HISTORY — DX: Encounter for other specified aftercare: Z51.89

## 2011-07-08 HISTORY — DX: Reserved for inherently not codable concepts without codable children: IMO0001

## 2011-07-08 HISTORY — DX: Essential (primary) hypertension: I10

## 2011-07-08 LAB — BASIC METABOLIC PANEL
BUN: 14 mg/dL (ref 6–23)
CO2: 31 mEq/L (ref 19–32)
Calcium: 9.1 mg/dL (ref 8.4–10.5)
Glucose, Bld: 111 mg/dL — ABNORMAL HIGH (ref 70–99)
Sodium: 141 mEq/L (ref 135–145)

## 2011-07-08 LAB — CBC
HCT: 32.8 % — ABNORMAL LOW (ref 36.0–46.0)
Hemoglobin: 10.5 g/dL — ABNORMAL LOW (ref 12.0–15.0)
MCH: 22.3 pg — ABNORMAL LOW (ref 26.0–34.0)
MCV: 69.6 fL — ABNORMAL LOW (ref 78.0–100.0)
RBC: 4.71 MIL/uL (ref 3.87–5.11)

## 2011-07-08 NOTE — Pre-Procedure Instructions (Signed)
07/08/11 B/P on preop appt initially 205/87.  B/P then 206/88 checked in bilateral arms. After cutting lights off and having pt to relax B/P decreased to 191/90 in left arm.  Pt voiced no complaints. Instructed pt to monitor B/P.

## 2011-07-08 NOTE — Patient Instructions (Signed)
20 Jenna Howe  07/08/2011   Your procedure is scheduled on:  07/10/2011 450pm-550pm  Report to [facility] at 1350 pm.  Call this number if you have problems the morning of surgery: [phone]   Remember:   Do not eat food:After Midnight.             Clear liquids from 12 midnite until 0950 am then npo. .    Take these medicines the morning of surgery with A SIP OF WATER:    Do not wear jewelry, make-up or nail polish.  Do not wear lotions, powders, or perfumes.   Do not shave 48 hours prior to surgery.  Do not bring valuables to the hospital.  Contacts, dentures or bridgework may not be worn into surgery.     Patients discharged the day of surgery will not be allowed to drive home.  Name and phone number of your driver:   Special Instructions: CHG Shower Use Special Wash: 1/2 bottle night before surgery and 1/2 bottle morning of surgery. shower chin to toes with CHG.  Wash face and private parts with regular soap.    Please read over the following fact sheets that you were given: MRSA Information, coughing and deep breathing exercises, leg exercises, Incentive Spirometry Fact Sheet

## 2011-07-09 ENCOUNTER — Other Ambulatory Visit: Payer: Self-pay | Admitting: Orthopedic Surgery

## 2011-07-09 NOTE — H&P (Signed)
  CC- Jenna Howe is a 71 y.o. female who presents with right knee stiffness.  HPI- . Knee Pain: Patient presents with stiffness involving the  right knee. Onset of the symptoms was several weeks ago. Inciting event: none known. Current symptoms include stiffness. Pain is aggravated by therapy.  Patient has had prior knee problems.Treatment to date: PT which was somewhat effective.  Past Medical History  Diagnosis Date  . Hypertension   . Anemia   . Blood transfusion     10/12 after knee surgery   . Arthritis     arthritis in knees and hands     Past Surgical History  Procedure Date  . Joint replacement     right knee 10/12 , left knee 2006   . Other surgical history     right knee replacement 8/11   . Cervical laminectomy 1995   . Cholecystectomy     1999    Prior to Admission medications   Medication Sig Start Date End Date Taking? Authorizing Provider  bumetanide (BUMEX) 1 MG tablet Take 1 mg by mouth daily.     Historical Provider, MD  Calcium Citrate-Vitamin D (CITRACAL + D PO) Take 1 tablet by mouth 2 (two) times daily.     Historical Provider, MD  Ferrous Gluconate (IRON) 240 (27 FE) MG TABS Take 1 tablet by mouth daily.     Historical Provider, MD  fish oil-omega-3 fatty acids 1000 MG capsule Take 1 g by mouth daily.     Historical Provider, MD  loratadine (CLARITIN) 10 MG tablet Take 10 mg by mouth daily.     Historical Provider, MD  meloxicam (MOBIC) 7.5 MG tablet Take 7.5 mg by mouth 2 (two) times daily.     Historical Provider, MD  methocarbamol (ROBAXIN) 500 MG tablet Take 500 mg by mouth every 8 (eight) hours as needed. MUSCLE SPASM     Historical Provider, MD  oxycodone (OXY-IR) 5 MG capsule Take 5 mg by mouth every 6 (six) hours as needed. PAIN    Historical Provider, MD  oxyCODONE-acetaminophen (PERCOCET) 5-325 MG per tablet Take 1 tablet by mouth every 4 (four) hours as needed. PAIN     Historical Provider, MD  primidone (MYSOLINE) 50 MG tablet Take 25 mg  by mouth 2 (two) times daily.     Historical Provider, MD  Travoprost, BAK Free, (TRAVATAN) 0.004 % SOLN ophthalmic solution Place 1 drop into both eyes at bedtime.     Historical Provider, MD  valsartan (DIOVAN) 320 MG tablet Take 320 mg by mouth daily with breakfast.     Historical Provider, MD  vitamin C (ASCORBIC ACID) 500 MG tablet Take 500 mg by mouth daily.     Historical Provider, MD   Knee Exam Limited motion-  10-70 degrees  Physical Examination: General appearance - alert, well appearing, and in no distress Mental status - alert, oriented to person, place, and time Chest - clear to auscultation, no wheezes, rales or rhonchi, symmetric air entry Heart - normal rate, regular rhythm, normal S1, S2, no murmurs, rubs, clicks or gallops Abdomen - soft, nontender, nondistended, no masses or organomegaly Neurological - alert, oriented, normal speech, no focal findings or movement disorder noted   Asessment/Plan- Right knee arthrofibrosis- Plan closed manipulation. Discussed in detail with patient who elects to proceed. Goal is increased range of motion which should be acheived

## 2011-07-10 ENCOUNTER — Ambulatory Visit (HOSPITAL_COMMUNITY)
Admission: RE | Admit: 2011-07-10 | Discharge: 2011-07-10 | Disposition: A | Discharge status: Home / Self Care | Payer: Medicare Other | Source: Ambulatory Visit | Attending: Orthopedic Surgery | Admitting: Orthopedic Surgery

## 2011-07-10 ENCOUNTER — Encounter (HOSPITAL_COMMUNITY): Payer: Self-pay | Admitting: Anesthesiology

## 2011-07-10 ENCOUNTER — Encounter (HOSPITAL_COMMUNITY): Payer: Self-pay | Admitting: *Deleted

## 2011-07-10 ENCOUNTER — Encounter (HOSPITAL_COMMUNITY)
Admission: RE | Disposition: A | Discharge status: Home / Self Care | Payer: Self-pay | Source: Ambulatory Visit | Attending: Orthopedic Surgery

## 2011-07-10 ENCOUNTER — Ambulatory Visit (HOSPITAL_COMMUNITY): Payer: Medicare Other | Admitting: Anesthesiology

## 2011-07-10 DIAGNOSIS — Z96659 Presence of unspecified artificial knee joint: Secondary | ICD-10-CM | POA: Insufficient documentation

## 2011-07-10 DIAGNOSIS — I10 Essential (primary) hypertension: Secondary | ICD-10-CM | POA: Insufficient documentation

## 2011-07-10 DIAGNOSIS — Z01812 Encounter for preprocedural laboratory examination: Secondary | ICD-10-CM | POA: Insufficient documentation

## 2011-07-10 DIAGNOSIS — M129 Arthropathy, unspecified: Secondary | ICD-10-CM | POA: Insufficient documentation

## 2011-07-10 DIAGNOSIS — M25669 Stiffness of unspecified knee, not elsewhere classified: Secondary | ICD-10-CM

## 2011-07-10 DIAGNOSIS — Z79899 Other long term (current) drug therapy: Secondary | ICD-10-CM | POA: Insufficient documentation

## 2011-07-10 DIAGNOSIS — D649 Anemia, unspecified: Secondary | ICD-10-CM | POA: Insufficient documentation

## 2011-07-10 DIAGNOSIS — M24669 Ankylosis, unspecified knee: Secondary | ICD-10-CM | POA: Insufficient documentation

## 2011-07-10 HISTORY — PX: KNEE CLOSED REDUCTION: SHX995

## 2011-07-10 SURGERY — MANIPULATION, KNEE, CLOSED
Anesthesia: General | Site: Knee | Laterality: Right

## 2011-07-10 MED ORDER — FENTANYL CITRATE 0.05 MG/ML IJ SOLN
INTRAMUSCULAR | Status: AC
  Filled 2011-07-10: qty 2

## 2011-07-10 MED ORDER — SODIUM CHLORIDE 0.9 % IV SOLN: INTRAVENOUS | Status: DC

## 2011-07-10 MED ORDER — ACETAMINOPHEN 10 MG/ML IV SOLN
INTRAVENOUS | Status: DC | PRN
  Administered 2011-07-10: 1000 mg via INTRAVENOUS

## 2011-07-10 MED ORDER — ACETAMINOPHEN 10 MG/ML IV SOLN: 1000.0000 mg | Freq: Once | INTRAVENOUS | Status: DC

## 2011-07-10 MED ORDER — LACTATED RINGERS IV SOLN
INTRAVENOUS | Status: DC | PRN
  Administered 2011-07-10: 16:00:00 via INTRAVENOUS

## 2011-07-10 MED ORDER — CHLORHEXIDINE GLUCONATE 4 % EX LIQD: 60.0000 mL | Freq: Once | CUTANEOUS | Status: DC

## 2011-07-10 MED ORDER — LACTATED RINGERS IV SOLN
INTRAVENOUS | Status: DC
  Administered 2011-07-10: 1000 mL via INTRAVENOUS

## 2011-07-10 MED ORDER — FENTANYL CITRATE 0.05 MG/ML IJ SOLN
25.0000 ug | INTRAMUSCULAR | Status: DC | PRN
  Administered 2011-07-10: 50 ug via INTRAVENOUS

## 2011-07-10 MED ORDER — FENTANYL CITRATE 0.05 MG/ML IJ SOLN
INTRAMUSCULAR | Status: DC | PRN
  Administered 2011-07-10 (×2): 50 ug via INTRAVENOUS

## 2011-07-10 MED ORDER — PROPOFOL 10 MG/ML IV BOLUS
INTRAVENOUS | Status: DC | PRN
  Administered 2011-07-10: 150 mg via INTRAVENOUS

## 2011-07-10 MED ORDER — ACETAMINOPHEN 10 MG/ML IV SOLN
INTRAVENOUS | Status: AC
  Filled 2011-07-10: qty 100

## 2011-07-10 MED ORDER — MIDAZOLAM HCL 5 MG/5ML IJ SOLN
INTRAMUSCULAR | Status: DC | PRN
  Administered 2011-07-10 (×2): 1 mg via INTRAVENOUS

## 2011-07-10 MED ORDER — PROMETHAZINE HCL 25 MG/ML IJ SOLN: 6.2500 mg | INTRAMUSCULAR | Status: DC | PRN

## 2011-07-10 MED ORDER — LACTATED RINGERS IV SOLN: INTRAVENOUS | Status: DC

## 2011-07-10 SURGICAL SUPPLY — 12 items
BANDAGE ADHESIVE 1X3 (GAUZE/BANDAGES/DRESSINGS) IMPLANT
CLOTH BEACON ORANGE TIMEOUT ST (SAFETY) IMPLANT
GLOVE BIO SURGEON STRL SZ7.5 (GLOVE) IMPLANT
GOWN STRL NON-REIN LRG LVL3 (GOWN DISPOSABLE) IMPLANT
GOWN STRL REIN XL XLG (GOWN DISPOSABLE) IMPLANT
MANIFOLD NEPTUNE II (INSTRUMENTS) IMPLANT
NDL SAFETY ECLIPSE 18X1.5 (NEEDLE) IMPLANT
NEEDLE HYPO 18GX1.5 SHARP (NEEDLE)
POSITIONER SURGICAL ARM (MISCELLANEOUS) IMPLANT
SPONGE GAUZE 4X4 12PLY (GAUZE/BANDAGES/DRESSINGS) IMPLANT
SYR CONTROL 10ML LL (SYRINGE) IMPLANT
TOWEL OR 17X26 10 PK STRL BLUE (TOWEL DISPOSABLE) IMPLANT

## 2011-07-10 NOTE — Transfer of Care (Signed)
Immediate Anesthesia Transfer of Care Note  Patient: Jenna Howe  Procedure(s) Performed:  CLOSED MANIPULATION KNEE  Patient Location: PACU  Anesthesia Type: General  Level of Consciousness: awake and alert   Airway & Oxygen Therapy: Patient Spontanous Breathing and Patient connected to face mask oxygen  Post-op Assessment: Report given to PACU RN and Post -op Vital signs reviewed and stable  Post vital signs: Reviewed and stable  Complications: No apparent anesthesia complications

## 2011-07-10 NOTE — Interval H&P Note (Signed)
History and Physical Interval Note:  07/10/2011 4:05 PM  Jenna Howe  has presented today for surgery, with the diagnosis of Arthrofibrosis of the Right Knee  The various methods of treatment have been discussed with the patient and family. After consideration of risks, benefits and other options for treatment, the patient has consented to  Procedure(s): CLOSED MANIPULATION KNEE as a surgical intervention .  The patients' history has been reviewed, patient examined, no change in status, stable for surgery.  I have reviewed the patients' chart and labs.  Questions were answered to the patient's satisfaction.     Loanne Drilling

## 2011-07-10 NOTE — Brief Op Note (Signed)
07/10/2011  4:21 PM  PATIENT:  Jenna Howe  71 y.o. female  PRE-OPERATIVE DIAGNOSIS:  Arthrofibrosis of the Right Knee  POST-OPERATIVE DIAGNOSIS:  Arthrofibrosis of the Right Knee  PROCEDURE:  Procedure(s): CLOSED MANIPULATION KNEE  SURGEON:  Surgeon(s): Gus Rankin Kyran Whittier  PHYSICIAN ASSISTANT:   ASSISTANTS: none   ANESTHESIA:   general  Pre-manipulation ROM- 10-70  Post-manipulation ROM- 5- 110  Complications- None   Condition- Stable---> PACU  Dictation # [id number]  Gus Rankin [name], MD    07/10/2011, 4:26 PM

## 2011-07-10 NOTE — Anesthesia Postprocedure Evaluation (Signed)
  Anesthesia Post-op Note  Patient: Jenna Howe  Procedure(s) Performed:  CLOSED MANIPULATION KNEE  Patient Location: PACU  Anesthesia Type: General  Level of Consciousness: awake and alert   Airway and Oxygen Therapy: Patient Spontanous Breathing  Post-op Pain: mild  Post-op Assessment: Post-op Vital signs reviewed, Patient's Cardiovascular Status Stable, Respiratory Function Stable, Patent Airway and No signs of Nausea or vomiting  Post-op Vital Signs: stable  Complications: No apparent anesthesia complications

## 2011-07-10 NOTE — Anesthesia Preprocedure Evaluation (Signed)
Anesthesia Evaluation  Patient identified by MRN, date of birth, ID band Patient awake    Reviewed: Allergy & Precautions, H&P , NPO status , Patient's Chart, lab work & pertinent test results  Airway Mallampati: II TM Distance: >3 FB Neck ROM: full    Dental  (+) Dental Advisory Given and Teeth Intact Caps all front bottom:   Pulmonary neg pulmonary ROS,  clear to auscultation  Pulmonary exam normal       Cardiovascular Exercise Tolerance: Good hypertension, On Medications neg cardio ROS regular Normal    Neuro/Psych Negative Neurological ROS  Negative Psych ROS   GI/Hepatic negative GI ROS, Neg liver ROS,   Endo/Other  Negative Endocrine ROS  Renal/GU negative Renal ROS  Genitourinary negative   Musculoskeletal   Abdominal   Peds  Hematology negative hematology ROS (+)   Anesthesia Other Findings   Reproductive/Obstetrics negative OB ROS                           Anesthesia Physical Anesthesia Plan  ASA: II  Anesthesia Plan: General   Post-op Pain Management:    Induction: Intravenous  Airway Management Planned: Mask  Additional Equipment:   Intra-op Plan:   Post-operative Plan:   Informed Consent: I have reviewed the patients History and Physical, chart, labs and discussed the procedure including the risks, benefits and alternatives for the proposed anesthesia with the patient or authorized representative who has indicated his/her understanding and acceptance.   Dental Advisory Given  Plan Discussed with: CRNA and Surgeon  Anesthesia Plan Comments:         Anesthesia Quick Evaluation

## 2011-07-11 ENCOUNTER — Encounter (HOSPITAL_COMMUNITY): Payer: Self-pay | Admitting: Orthopedic Surgery

## 2011-07-11 NOTE — Op Note (Signed)
Jenna Howe, Jenna Howe NO.:  [id number]  MEDICAL RECORD NO.:  [id number]  LOCATION:  [facility]                         FACILITY:  [facility]  PHYSICIAN:  Ollen Gross, M.D.    DATE OF BIRTH:  Jan 23, 1941  DATE OF PROCEDURE:  07/10/2011 DATE OF DISCHARGE:  07/10/2011                              OPERATIVE REPORT   PREOPERATIVE DIAGNOSIS:  Arthrofibrosis, right knee.  POSTOPERATIVE DIAGNOSIS:  Arthrofibrosis, right knee.  PROCEDURE:  Right knee closed manipulation.  SURGEON:  Ollen Gross, M.D.  ASSISTANT:  None.  ANESTHESIA:  General.  Pre-manipulation range of motion 10-70.  Postmanipulation range of motion 5-110.  COMPLICATIONS:  None.  CONDITION:  Stable to Recovery.  CLINICAL INDICATION:  [name] is a 71 year old female, had a right femoral revision done approximately 2-1/2 to 3 months ago and has had problems regaining range of motion.  She has had extensive physical therapy and has been stuck at about 70 degrees of flexion.  She presents now for closed manipulation.  PROCEDURE IN DETAIL:  After successful administration of general anesthetic, time out was performed and an exam under anesthesia performed.  Range of motion was 10-70.  I placed my chest up against her proximal tibia and gently flexed the knee with audible lysis of adhesions.  I was able to get her to 110 degrees without difficulty. Given that she had fairly soft bone at the time of her revision, I did not want to push further than this for fear of creating a periprosthetic fracture.  I was easily getting 110 and felt that that was an excellent improvement in her range of motion.  I then mobilized the patella, was able to get her within 5 degrees of full extension.  She was subsequently awakened and transported to Recovery in stable condition.    Ollen Gross, M.D.    FA/MEDQ  D:  07/10/2011  T:  07/11/2011  Job:  [id number]

## 2011-10-02 ENCOUNTER — Other Ambulatory Visit: Payer: Self-pay | Admitting: Obstetrics and Gynecology

## 2011-10-02 DIAGNOSIS — Z1231 Encounter for screening mammogram for malignant neoplasm of breast: Secondary | ICD-10-CM

## 2011-11-12 ENCOUNTER — Ambulatory Visit
Admission: RE | Admit: 2011-11-12 | Discharge: 2011-11-12 | Disposition: A | Discharge status: Home / Self Care | Payer: Medicare Other | Source: Ambulatory Visit | Attending: Obstetrics and Gynecology | Admitting: Obstetrics and Gynecology

## 2011-11-12 DIAGNOSIS — Z1231 Encounter for screening mammogram for malignant neoplasm of breast: Secondary | ICD-10-CM

## 2012-10-15 ENCOUNTER — Other Ambulatory Visit: Payer: Self-pay

## 2012-10-15 DIAGNOSIS — Z1231 Encounter for screening mammogram for malignant neoplasm of breast: Secondary | ICD-10-CM

## 2012-11-18 ENCOUNTER — Ambulatory Visit
Admission: RE | Admit: 2012-11-18 | Discharge: 2012-11-18 | Disposition: A | Discharge status: Home / Self Care | Payer: Medicare Other | Source: Ambulatory Visit

## 2012-11-18 DIAGNOSIS — Z1231 Encounter for screening mammogram for malignant neoplasm of breast: Secondary | ICD-10-CM

## 2012-12-23 ENCOUNTER — Telehealth: Payer: Self-pay | Admitting: Diagnostic Neuroimaging

## 2012-12-28 ENCOUNTER — Telehealth: Payer: Self-pay | Admitting: Diagnostic Neuroimaging

## 2013-03-01 ENCOUNTER — Ambulatory Visit: Payer: Self-pay | Admitting: Diagnostic Neuroimaging

## 2013-03-12 ENCOUNTER — Encounter: Payer: Self-pay | Admitting: Diagnostic Neuroimaging

## 2013-03-12 ENCOUNTER — Ambulatory Visit (INDEPENDENT_AMBULATORY_CARE_PROVIDER_SITE_OTHER): Payer: Medicare Other | Admitting: Diagnostic Neuroimaging

## 2013-03-12 VITALS — BP 148/74 | HR 86 | Temp 97.9°F | Ht 63.5 in | Wt 153.0 lb

## 2013-03-12 DIAGNOSIS — G250 Essential tremor: Secondary | ICD-10-CM

## 2013-03-12 NOTE — Progress Notes (Signed)
[facility]  PATIENT: Jenna Howe DOB: 1941-01-29  REFERRING CLINICIAN:  HISTORY FROM: patient REASON FOR VISIT: follow up   HISTORICAL  CHIEF COMPLAINT:  Chief Complaint  Patient presents with  . f/u per recall    HISTORY OF PRESENT ILLNESS:   UPDATE 03/12/13: Since last visit patient is doing well. Continues primidone 25 mg twice a day. For 5 days per month, especially in mid afternoon, her tremor may slightly worsened. We discussed possibility of increased medication. Otherwise no complaints.  UPDATE 02/07/12: Tremor well controlled. Denies side effects to Primidone.   UPDATE 08/09/11:  Tremor  better with tasks (makeup).  Feeling less  fatigued now that she is accustomed to dose. Had several changes in B/P meds since last seen. Remains elevated today. Last seen by Dr. Marjory Lies 01/23/11.   PRIOR HPI (01/23/11): 72 year old right-handed female with hypertension, history of cervical laminectomy and discectomy, arthritis, bilateral total knee replacements, here for evaluation of tremor since Jan 2012. The patient reports gradual, progressive tremor of bilateral upper extremities (left greater than right), mainly when she is doing specific tasks such as applying makeup, putting on earrings, using a spoon or drinking  from a glass. She denies any tremor at rest.  She denies any new  smell or taste difficulties, sleep disturbances or walking problems. She denies any swallowing difficulties. She has a brother with Parkinson's disease. No other family history of tremor.  REVIEW OF SYSTEMS: Full 14 system review of systems performed and notable only for none.  ALLERGIES: Allergies  Allergen Reactions  . Celebrex [Celecoxib] Rash  . Penicillins Rash  . Sulfa Antibiotics Rash    HOME MEDICATIONS: Prior to Admission medications   Medication Sig Start Date End Date Taking? Authorizing Provider  amLODipine-valsartan (EXFORGE) 10-320 MG per tablet Take 1 tablet by  mouth daily.   Yes Historical Provider, MD  BIOTIN 5000 PO Take 5,000 mcg by mouth once.   Yes Historical Provider, MD  bumetanide (BUMEX) 1 MG tablet Take 1 mg by mouth daily.    Yes Historical Provider, MD  Calcium Citrate-Vitamin D (CITRACAL + D PO) Take 1 tablet by mouth 2 (two) times daily.    Yes Historical Provider, MD  Cholecalciferol (VITAMIN D3) 1000 UNITS CAPS Take 1,000 capsules by mouth daily.   Yes Historical Provider, MD  fish oil-omega-3 fatty acids 1000 MG capsule Take 1 g by mouth daily.    Yes Historical Provider, MD  loratadine (CLARITIN) 10 MG tablet Take 10 mg by mouth daily.    Yes Historical Provider, MD  meloxicam (MOBIC) 7.5 MG tablet Take 7.5 mg by mouth 2 (two) times daily.    Yes Historical Provider, MD  methocarbamol (ROBAXIN) 500 MG tablet Take 500 mg by mouth every 8 (eight) hours as needed. MUSCLE SPASM    Yes Historical Provider, MD  oxycodone (OXY-IR) 5 MG capsule Take 5 mg by mouth every 6 (six) hours as needed. PAIN   Yes Historical Provider, MD  oxyCODONE-acetaminophen (PERCOCET) 5-325 MG per tablet Take 1 tablet by mouth every 4 (four) hours as needed. PAIN    Yes Historical Provider, MD  Polysaccharide Iron Complex (FERREX 150 PO) Take 150 mg by mouth once.   Yes Historical Provider, MD  primidone (MYSOLINE) 50 MG tablet Take 25 mg by mouth 2 (two) times daily.    Yes Historical Provider, MD  Travoprost, BAK Free, (TRAVATAN) 0.004 % SOLN ophthalmic solution Place 1 drop into both eyes at bedtime.  Yes Historical Provider, MD  vitamin C (ASCORBIC ACID) 500 MG tablet Take 500 mg by mouth daily.    Yes Historical Provider, MD   Outpatient Prescriptions Prior to Visit  Medication Sig Dispense Refill  . bumetanide (BUMEX) 1 MG tablet Take 1 mg by mouth daily.       . Calcium Citrate-Vitamin D (CITRACAL + D PO) Take 1 tablet by mouth 2 (two) times daily.       . fish oil-omega-3 fatty acids 1000 MG capsule Take 1 g by mouth daily.       Marland Kitchen loratadine  (CLARITIN) 10 MG tablet Take 10 mg by mouth daily.       . meloxicam (MOBIC) 7.5 MG tablet Take 7.5 mg by mouth 2 (two) times daily.       . methocarbamol (ROBAXIN) 500 MG tablet Take 500 mg by mouth every 8 (eight) hours as needed. MUSCLE SPASM       . oxycodone (OXY-IR) 5 MG capsule Take 5 mg by mouth every 6 (six) hours as needed. PAIN      . oxyCODONE-acetaminophen (PERCOCET) 5-325 MG per tablet Take 1 tablet by mouth every 4 (four) hours as needed. PAIN       . primidone (MYSOLINE) 50 MG tablet Take 25 mg by mouth 2 (two) times daily.       . Travoprost, BAK Free, (TRAVATAN) 0.004 % SOLN ophthalmic solution Place 1 drop into both eyes at bedtime.       . vitamin C (ASCORBIC ACID) 500 MG tablet Take 500 mg by mouth daily.       . Ferrous Gluconate (IRON) 240 (27 FE) MG TABS Take 1 tablet by mouth daily.       . valsartan (DIOVAN) 320 MG tablet Take 320 mg by mouth daily with breakfast.        No facility-administered medications prior to visit.    PAST MEDICAL HISTORY: Past Medical History  Diagnosis Date  . Hypertension   . Anemia   . Blood transfusion     10/12 after knee surgery   . Arthritis     arthritis in knees and hands   . Essential tremor     PAST SURGICAL HISTORY: Past Surgical History  Procedure Laterality Date  . Joint replacement      right knee 10/12 , left knee 2006   . Other surgical history      right knee replacement 8/11   . Cervical laminectomy  1995   . Cholecystectomy      1999  . Knee closed reduction  07/10/2011    Procedure: CLOSED MANIPULATION KNEE;  Surgeon: Loanne Drilling;  Location: [facility];  Service: Orthopedics;  Laterality: Right;    FAMILY HISTORY: Family History  Problem Relation Age of Onset  . Stroke Mother   . Parkinsonism Mother   . Diabetes Mother     SOCIAL HISTORY:  History   Social History  . Marital Status: Married    Spouse Name: N/A    Number of Children: 1  . Years of Education: MA   Occupational History  .  Not on file.   Social History Main Topics  . Smoking status: Never Smoker   . Smokeless tobacco: Current User    Types: Chew  . Alcohol Use: No  . Drug Use: No  . Sexual Activity: Not on file   Other Topics Concern  . Not on file   Social History Narrative   Patient lives at  home with her spouse.   Caffeine Use: does consume     PHYSICAL EXAM  Filed Vitals:   03/12/13 1148  BP: 148/74  Pulse: 86  Temp: 97.9 F (36.6 C)  TempSrc: Oral  Height: 5' 3.5" (1.613 m)  Weight: 153 lb (69.4 kg)    Not recorded    Body mass index is 26.67 kg/(m^2).  EXAM: General: Patient is awake, alert and in no acute distress.  Well developed and groomed.  Neck: Neck is supple. Cardiovascular: Heart is regular rate and rhythm with no murmurs.  Neurologic Exam  Mental Status: Awake, alert.  Language is fluent and comprehension intact. Cranial Nerves: Pupils are equal and reactive to light.  Visual fields are full to confrontation.  Conjugate eye movements are full and symmetric.  Facial sensation and strength are symmetric.  Hearing is intact.  Palate elevated symmetrically and uvula is midline.  Shoulder shrug is symmetric.  Tongue is midline. Neg myerson's sign. Motor: Normal bulk and tone.  MINIMAL POSTURAL TREMOR IN LUE. NO REST TREMOR.  NO BRADYKINESIA OR COGWHEELING.  Full strength in the upper and lower extremities.  No pronator drift. Sensory: . Coordination: No ataxia or dysmetria on finger-nose or rapid alternating movement testing. Gait and Station: CAUTIOUS, LIMPING GAIT, DUE TO DIFFERENCES OF LEG LENGTH (RIGHT LEG SHORTER). Reflexes: Deep tendon reflexes in the UPPER EXT ARE 3, ABSENT AT KNEES (BILATERAL KNEE REPLACEMENTS), AND 1 AT ANKLES.   DIAGNOSTIC DATA (LABS, IMAGING, TESTING) - I reviewed patient records, labs, notes, testing and imaging myself where available.  Lab Results  Component Value Date   WBC 7.3 07/08/2011   HGB 10.5* 07/08/2011   HCT 32.8* 07/08/2011    MCV 69.6* 07/08/2011   PLT 256 07/08/2011      Component Value Date/Time   NA 141 07/08/2011 1500   K 3.9 07/08/2011 1500   CL 103 07/08/2011 1500   CO2 31 07/08/2011 1500   GLUCOSE 111* 07/08/2011 1500   BUN 14 07/08/2011 1500   CREATININE 0.96 07/08/2011 1500   CALCIUM 9.1 07/08/2011 1500   PROT 7.8 04/26/2011 1220   ALBUMIN 4.0 04/26/2011 1220   AST 23 04/26/2011 1220   ALT 15 04/26/2011 1220   ALKPHOS 75 04/26/2011 1220   BILITOT 0.2* 04/26/2011 1220   GFRNONAA 58* 07/08/2011 1500   GFRAA 67* 07/08/2011 1500   No results found for this basename: CHOL, HDL, LDLCALC, LDLDIRECT, TRIG, CHOLHDL   No results found for this basename: HGBA1C   No results found for this basename: VITAMINB12   No results found for this basename: TSH       ASSESSMENT AND PLAN  72 y.o. female with essential tremor. No bradykinesia, rigidity or parkinsonian gait.  Good benefit with primidone.  1. Continue primidone 50mg  (half tab BID); may tryt increasing to full tab BID as tolerated (if tremor worsens)   Return in about 1 year (around 03/12/2014) for with Edison Nasuti, MD 03/12/2013, 11:57 AM Certified in Neurology, Neurophysiology and Neuroimaging  [facility] [address], [address] [place], [place] [address] [phone]

## 2013-03-12 NOTE — Patient Instructions (Signed)
Continue primidone. May try increasing to 1 full tab twice a day if tremor worsens.

## 2013-03-30 ENCOUNTER — Other Ambulatory Visit: Payer: Self-pay | Admitting: Diagnostic Neuroimaging

## 2013-09-01 ENCOUNTER — Other Ambulatory Visit: Payer: Self-pay | Admitting: Internal Medicine

## 2013-09-01 ENCOUNTER — Ambulatory Visit
Admission: RE | Admit: 2013-09-01 | Discharge: 2013-09-01 | Disposition: A | Discharge status: Home / Self Care | Payer: Medicare Other | Source: Ambulatory Visit | Attending: Internal Medicine | Admitting: Internal Medicine

## 2013-09-01 DIAGNOSIS — M47812 Spondylosis without myelopathy or radiculopathy, cervical region: Secondary | ICD-10-CM

## 2013-09-15 NOTE — Telephone Encounter (Signed)
Pt came in for her visit, closing encounter °

## 2013-10-15 ENCOUNTER — Other Ambulatory Visit: Payer: Self-pay

## 2013-10-15 DIAGNOSIS — Z1231 Encounter for screening mammogram for malignant neoplasm of breast: Secondary | ICD-10-CM

## 2013-10-25 ENCOUNTER — Encounter (HOSPITAL_COMMUNITY): Payer: Self-pay | Admitting: Emergency Medicine

## 2013-10-25 ENCOUNTER — Emergency Department (HOSPITAL_COMMUNITY): Payer: Medicare Other

## 2013-10-25 ENCOUNTER — Emergency Department (HOSPITAL_COMMUNITY)
Admission: EM | Admit: 2013-10-25 | Discharge: 2013-10-25 | Disposition: A | Discharge status: Home / Self Care | Payer: Medicare Other | Attending: Emergency Medicine | Admitting: Emergency Medicine

## 2013-10-25 DIAGNOSIS — M19049 Primary osteoarthritis, unspecified hand: Secondary | ICD-10-CM | POA: Insufficient documentation

## 2013-10-25 DIAGNOSIS — I10 Essential (primary) hypertension: Secondary | ICD-10-CM | POA: Insufficient documentation

## 2013-10-25 DIAGNOSIS — Z791 Long term (current) use of non-steroidal anti-inflammatories (NSAID): Secondary | ICD-10-CM | POA: Insufficient documentation

## 2013-10-25 DIAGNOSIS — Z880 Allergy status to penicillin: Secondary | ICD-10-CM | POA: Insufficient documentation

## 2013-10-25 DIAGNOSIS — S92501A Displaced unspecified fracture of right lesser toe(s), initial encounter for closed fracture: Secondary | ICD-10-CM

## 2013-10-25 DIAGNOSIS — IMO0002 Reserved for concepts with insufficient information to code with codable children: Secondary | ICD-10-CM

## 2013-10-25 DIAGNOSIS — M1710 Unilateral primary osteoarthritis, unspecified knee: Secondary | ICD-10-CM | POA: Insufficient documentation

## 2013-10-25 DIAGNOSIS — Y939 Activity, unspecified: Secondary | ICD-10-CM | POA: Insufficient documentation

## 2013-10-25 DIAGNOSIS — S92919A Unspecified fracture of unspecified toe(s), initial encounter for closed fracture: Secondary | ICD-10-CM | POA: Insufficient documentation

## 2013-10-25 DIAGNOSIS — Z79899 Other long term (current) drug therapy: Secondary | ICD-10-CM | POA: Insufficient documentation

## 2013-10-25 DIAGNOSIS — M79676 Pain in unspecified toe(s): Secondary | ICD-10-CM

## 2013-10-25 DIAGNOSIS — Z8669 Personal history of other diseases of the nervous system and sense organs: Secondary | ICD-10-CM | POA: Insufficient documentation

## 2013-10-25 DIAGNOSIS — D649 Anemia, unspecified: Secondary | ICD-10-CM | POA: Insufficient documentation

## 2013-10-25 DIAGNOSIS — Y929 Unspecified place or not applicable: Secondary | ICD-10-CM | POA: Insufficient documentation

## 2013-10-25 DIAGNOSIS — W208XXA Other cause of strike by thrown, projected or falling object, initial encounter: Secondary | ICD-10-CM | POA: Insufficient documentation

## 2013-10-25 NOTE — ED Notes (Signed)
Toes buddy taped at this time, post-op shoe provided.  Pt educated on use of post-op shoe and verbalized understanding.

## 2013-10-25 NOTE — ED Provider Notes (Signed)
CSN: [id number]     Arrival date & time 10/25/13  1558 History   None    This chart was scribed for non-physician practitioner, Jamse Mead PA-C working with Saddie Benders. Dorna Mai, MD by Forrestine Him, ED Scribe. This patient was seen in room [facility] and the patient's care was started at 7:35 PM.   Chief Complaint  Patient presents with  . Foot Injury   The history is provided by the patient. No language interpreter was used.    HPI Comments: Jenna Howe is a 73 y.o. Female with a PMHx of bilateral lower extremity swelling, HTN, and arthritis who presents to the Emergency Department complaining of non-radiating constant, moderate R foot pain to the base of the toes x 4 hours that is unchanged. She describes this pain as throbbing. Pt states she dropped a bucket full of paint on her toes earlier today. She has not tried anything OTC or any home remedies for this discomfort. At this time she denies any numbness, skin discoloration, fever, bleeding, dizziness, loss of sensation, or tingling. No past injury to foot. No other pertinent medical history. No other concerns this visit.  Past Medical History  Diagnosis Date  . Hypertension   . Anemia   . Blood transfusion     10/12 after knee surgery   . Arthritis     arthritis in knees and hands   . Essential tremor    Past Surgical History  Procedure Laterality Date  . Joint replacement      right knee 10/12 , left knee 2006   . Other surgical history      right knee replacement 8/11   . Cervical laminectomy  1995   . Cholecystectomy      1999  . Knee closed reduction  07/10/2011    Procedure: CLOSED MANIPULATION KNEE;  Surgeon: Gearlean Alf;  Location: [facility];  Service: Orthopedics;  Laterality: Right;   Family History  Problem Relation Age of Onset  . Stroke Mother   . Parkinsonism Mother   . Diabetes Mother    History  Substance Use Topics  . Smoking status: Never Smoker   . Smokeless tobacco: Current User    Types: Chew   . Alcohol Use: No   OB History   Grav Para Term Preterm Abortions TAB SAB Ect Mult Living                 Review of Systems  Constitutional: Negative for fever and chills.  HENT: Negative for congestion.   Eyes: Negative for redness.  Respiratory: Negative for cough.   Musculoskeletal: Positive for arthralgias (R foot pain).  Skin: Negative for color change and rash.  Neurological: Negative for weakness and numbness.  Psychiatric/Behavioral: Negative for confusion.  All other systems reviewed and are negative.     Allergies  Celebrex; Penicillins; and Sulfa antibiotics  Home Medications   Prior to Admission medications   Medication Sig Start Date End Date Taking? Authorizing Provider  amLODipine-valsartan (EXFORGE) 10-320 MG per tablet Take 1 tablet by mouth daily.    Historical Provider, MD  BIOTIN 5000 PO Take 5,000 mcg by mouth once.    Historical Provider, MD  bumetanide (BUMEX) 1 MG tablet Take 1 mg by mouth daily.     Historical Provider, MD  Calcium Citrate-Vitamin D (CITRACAL + D PO) Take 1 tablet by mouth 2 (two) times daily.     Historical Provider, MD  Cholecalciferol (VITAMIN D3) 1000 UNITS CAPS Take 1,000 capsules  by mouth daily.    Historical Provider, MD  fish oil-omega-3 fatty acids 1000 MG capsule Take 1 g by mouth daily.     Historical Provider, MD  loratadine (CLARITIN) 10 MG tablet Take 10 mg by mouth daily.     Historical Provider, MD  meloxicam (MOBIC) 7.5 MG tablet Take 7.5 mg by mouth 2 (two) times daily.     Historical Provider, MD  methocarbamol (ROBAXIN) 500 MG tablet Take 500 mg by mouth every 8 (eight) hours as needed. MUSCLE SPASM     Historical Provider, MD  oxycodone (OXY-IR) 5 MG capsule Take 5 mg by mouth every 6 (six) hours as needed. PAIN    Historical Provider, MD  oxyCODONE-acetaminophen (PERCOCET) 5-325 MG per tablet Take 1 tablet by mouth every 4 (four) hours as needed. PAIN     Historical Provider, MD  Polysaccharide Iron Complex  (FERREX 150 PO) Take 150 mg by mouth once.    Historical Provider, MD  primidone (MYSOLINE) 50 MG tablet TAKE 1/2 A TABLET TWICE A DAY 03/30/13   Vikram R Penumalli, MD  Travoprost, BAK Free, (TRAVATAN) 0.004 % SOLN ophthalmic solution Place 1 drop into both eyes at bedtime.     Historical Provider, MD  vitamin C (ASCORBIC ACID) 500 MG tablet Take 500 mg by mouth daily.     Historical Provider, MD   Triage Vitals: BP 163/60  Pulse 79  Temp(Src) 99 F (37.2 C) (Oral)  Resp 18  Ht 5\' 4"  (1.626 m)  Wt 154 lb (69.854 kg)  BMI 26.42 kg/m2  SpO2 100%   Physical Exam  Nursing note and vitals reviewed. Constitutional: She is oriented to person, place, and time. She appears well-developed and well-nourished. No distress.  HENT:  Head: Normocephalic and atraumatic.  Mouth/Throat: Oropharynx is clear and moist. No oropharyngeal exudate.  Eyes: Conjunctivae and EOM are normal. Pupils are equal, round, and reactive to light. Right eye exhibits no discharge. Left eye exhibits no discharge.  Neck: Normal range of motion. No tracheal deviation present.  Cardiovascular: Normal rate, regular rhythm and normal heart sounds.  Exam reveals no friction rub.   No murmur heard. Pulses:      Radial pulses are 2+ on the right side, and 2+ on the left side.       Dorsalis pedis pulses are 2+ on the right side, and 2+ on the left side.  Chronic swelling to the lower extremities bilaterally with negative pitting edema - patient wears compressions stockings Cap refill < 3 seconds  Pulmonary/Chest: Effort normal and breath sounds normal. No respiratory distress. She has no wheezes. She has no rales.  Musculoskeletal: Normal range of motion. She exhibits tenderness.       Feet:  Mild swelling identified to the base of the digits of the right foot with negative ecchymosis, deformities, puncture wounds, open wounds, bleeding, drainage, erythema, inflammation, lesions, sores. Discomfort upon palpation to the base of  the first through fifth digits, most discomfort upon palpation to the base of the digits of the third through fifth. Full range of motion noted-full flexion extension. Negative crepitus upon palpation.  Lymphadenopathy:    She has no cervical adenopathy.  Neurological: She is alert and oriented to person, place, and time. No cranial nerve deficit. She exhibits normal muscle tone. Coordination normal.  Cranial nerves III-XII grossly intact Strength 5+/5+ to upper and lower extremities bilaterally with resistance applied, equal distribution noted Strength intact to the digits of the feet bilaterally Sensation intact with  differentiation sharp and dull touch  Skin: Skin is warm and dry. No rash noted. She is not diaphoretic. No erythema.  Psychiatric: She has a normal mood and affect. Her behavior is normal. Thought content normal.    ED Course  Procedures (including critical care time)  DIAGNOSTIC STUDIES: Oxygen Saturation is 100% on RA, Normal by my interpretation.    COORDINATION OF CARE: 7:40 PM- Will order DG Foot complete R. Discussed treatment plan with pt at bedside and pt agreed to plan.     Labs Review Labs Reviewed - No data to display  Imaging Review Dg Foot Complete Right  10/25/2013   CLINICAL DATA:  Foot injury.  Dropped heavy object on foot.  EXAM: RIGHT FOOT COMPLETE - 3+ VIEW  COMPARISON:  None.  FINDINGS: Irregularity and lucency in the distal aspect of the right fifth proximal phalanx. Cannot exclude nondisplaced fracture. Recommend correlation for pain in this area. No additional suspicious areas. Mild degenerative changes in the first MTP joint.  IMPRESSION: Subtle lucency in the distal aspect of the right fifth proximal phalanx at the PIP joint. Recommend correlation for pain in this area. Cannot exclude nondisplaced fracture.   Electronically Signed   By: Rolm Baptise M.D.   On: 10/25/2013 17:36     EKG Interpretation None      MDM   Final diagnoses:   Fracture of fifth toe, right, closed  Toe pain    Filed Vitals:   10/25/13 1637 10/25/13 2008  BP: 163/60 159/84  Pulse: 79 84  Temp: 99 F (37.2 C)   TempSrc: Oral   Resp: 18 16  Height: 5\' 4"  (1.626 m)   Weight: 154 lb (69.854 kg)   SpO2: 100% 99%    I personally performed the services described in this documentation, which was scribed in my presence. The recorded information has been reviewed and is accurate.   Patient presenting to the ED with right foot pain that started this afternoon at approximately 3:30 PM after a full paint bucket landed on her foot. Per the most of the discomfort is localized to the base of the toes described as a throbbing sensation without radiation. Stated that most discomfort is localized to the third, fourth, fifth toe. Denied numbness, tingling, changes to colors, fever, chills, bleeding, loss of sensation. Alert and oriented. GCS 15. Heart rate and rhythm normal. Lungs clear to auscultation to upper lower lobes bilaterally. Radial and DP pulses 2+ bilaterally. Chronic swelling identified to lower tremors bilaterally with negative pitting edema-patient wearing compression stockings. Mild swelling identified to the base of the digits of the right foot with negative ecchymosis, erythema, inflammation, lesions, sores. Negative open fractures or wounds noted. Negative active bleeding or drainage noted. Negative subungual hematomas noted. Discomfort upon palpation to the base of the digits of the right foot, most discomfort at the base of the third through fifth digit. Full range of motion noted-full flexion extension. Sensation intact with differentiation to sharp and dull touch. Strength intact to the digits of the feet bilaterally. Cap refill < 3 seconds.  Plain film of right foot noted subtle lucency in the distal aspect of the right fifth proximal phalanx at the PIP joint where nondisplaced fracture cannot be excluded. This provider spoke with attending  physician who recommended patient to be placed in a post-op show and for patient to follow-up as outpatient with orthopedics.  Patient neurovascularly intact. Negative deformities identified to the fifth digit. Lucency identified to the right fifth proximal phalanx  of the PIP joint-will treat patient as if there is a fracture. Toes buddy taped and patient placed in postop shoe. Pulses palpable and strong. Patient stable, afebrile. Patient not septic appearing. Discussed with patient with recommendation for rest, ice, elevate. Discussed with patient to take Tylenol as needed for pain - discussed proper dosing. Referred patient to primary care provider and orthopedics. Discussed with patient to closely monitor symptoms and if symptoms are to worsen or change to report back to the ED - strict return instructions given.  Patient agreed to plan of care, understood, all questions answered.   Jamse Mead, PA-C 10/25/13 2221

## 2013-10-25 NOTE — ED Notes (Signed)
Per pt, dropped bucket of pain on right foot/toes.

## 2013-10-25 NOTE — ED Notes (Signed)
Initial contact - pt reports dropping a full can of paint onto R foot earlier today, c/o 9/10 pain to site.  Swelling noted to dorsum of foot.  Pt denies n/t to foot.  Moving toes without issue, though reports inc pain with movement.  Skin otherwise PWD.  Speaking full/clear sentences.  NAD.

## 2013-10-25 NOTE — Discharge Instructions (Signed)
Please call your doctor for a followup appointment within 24-48 hours. When you talk to your doctor please let them know that you were seen in the emergency department and have them acquire all of your records so that they can discuss the findings with you and formulate a treatment plan to fully care for your new and ongoing problems. Please call and set-up an appointment with your primary care provider to be re-assessed within 24-48 hours Please call and set-up appointment with orthopedics, Dr. Ninfa Linden to be reassessed regarding toe injury Please rest and stay hydrated Please take Tylenol as needed for pain control - please no more than 4,000mg /4 grams per day for this can lead to Tylenol overdose that is fatal and liver failure Please keep foot elevated-above heart level-and ice at least 3-4 times per day Please continue to monitor symptoms closely and if symptoms are to worsen or change (fever greater than 101, chills, chest pain, shortness of breath, numbness, tingling, red streaks running up the foot, changes to colors, fall, injury) please report back to the ED immediately   Buddy Taping of Toes We have taped your toes together to keep them from moving. This is called "buddy taping" since we used a part of your own body to keep the injured part still. We placed soft padding between your toes to keep them from rubbing against each other. Buddy taping will help with healing and to reduce pain. Keep your toes buddy taped together for as long as directed by your caregiver. HOME CARE INSTRUCTIONS   Raise your injured area above the level of your heart while sitting or lying down. Prop it up with pillows.  An ice pack used every twenty minutes, while awake, for the first one to two days may be helpful. Put ice in a plastic bag and put a towel between the bag and your skin.  Watch for signs that the taping is too tight. These signs may be:  Numbness of your taped toes.  Coolness of your taped  toes.  Color change in the area beyond the tape.  Increased pain.  If you have any of these signs, loosen or rewrap the tape. If you need to loosen or rewrap the buddy tape, make sure you use the padding again. SEEK IMMEDIATE MEDICAL CARE IF:   You have worse pain, swelling, inflammation (soreness), drainage or bleeding after you rewrap the tape.  Any new problems occur. MAKE SURE YOU:   Understand these instructions.  Will watch your condition.  Will get help right away if you are not doing well or get worse. Document Released: 03/28/2004 Document Revised: 09/16/2011 Document Reviewed: 06/21/2008 [facility] Patient Information 2014 [place]. Toe Fracture A toe fracture is a break in the bone of a toe. It may take 6 to 8 weeks for the toe injury to heal. HOME CARE  "Buddy taping" is taping the broken toe to the toe next to it. Leave the toes taped together for at least 1 week or as told by your doctor. Change the tape after bathing. Always use a small piece of gauze or cotton between the toes when taping them together.  Put ice on the injured area.  Put ice in a plastic bag.  Place a towel between your skin and the bag.  Leave the ice on for 15-20 minutes, 03-04 times a day.  After the first 2 days, put heat on the injured area. Use heat for the next 2 to 3 days. Put a heating  pad on the foot or soak the foot in warm water as told by your doctor.  Keep the foot raised (elevated) above the level of your heart.  Wear sturdy, supportive shoes. The shoes should not pinch the toes or fit tightly against the toes.  Use a cast shoe (if prescribed) if the foot is very puffy (swollen).  Use crutches if you have pain or it hurts too much to walk.  Only take medicine as told by your doctor.  Follow up with your doctor as told. GET HELP RIGHT AWAY IF:   There is pain or puffiness that is not helped by medicine.  The pain does not get better after 1 week.  The toe is  cold when the others are warm.  The toe loses feeling (numb) or turns white.  The toe becomes hot and red (inflamed). MAKE SURE YOU:   Understand these instructions.  Will watch this condition.  Will get help right away if you are not doing well or get worse. Document Released: 12/11/2007 Document Revised: 09/16/2011 Document Reviewed: 11/17/2009 [facility] Patient Information 2014 [place].

## 2013-10-27 NOTE — ED Provider Notes (Signed)
Medical screening examination/treatment/procedure(s) were performed by non-physician practitioner and as supervising physician I was immediately available for consultation/collaboration.  Saddie Benders. Daimon Kean, MD 10/27/13 0000

## 2013-11-22 ENCOUNTER — Ambulatory Visit
Admission: RE | Admit: 2013-11-22 | Discharge: 2013-11-22 | Disposition: A | Discharge status: Home / Self Care | Payer: Medicare Other | Source: Ambulatory Visit

## 2013-11-22 DIAGNOSIS — Z1231 Encounter for screening mammogram for malignant neoplasm of breast: Secondary | ICD-10-CM

## 2014-03-16 ENCOUNTER — Encounter (INDEPENDENT_AMBULATORY_CARE_PROVIDER_SITE_OTHER): Payer: Self-pay

## 2014-03-16 ENCOUNTER — Encounter: Payer: Self-pay | Admitting: Nurse Practitioner

## 2014-03-16 ENCOUNTER — Ambulatory Visit (INDEPENDENT_AMBULATORY_CARE_PROVIDER_SITE_OTHER): Payer: Medicare Other | Admitting: Nurse Practitioner

## 2014-03-16 VITALS — BP 133/71 | HR 82 | Ht 64.0 in | Wt 155.6 lb

## 2014-03-16 DIAGNOSIS — G250 Essential tremor: Secondary | ICD-10-CM

## 2014-03-16 DIAGNOSIS — G252 Other specified forms of tremor: Secondary | ICD-10-CM

## 2014-03-16 MED ORDER — PRIMIDONE 50 MG PO TABS: ORAL_TABLET | ORAL | Status: DC

## 2014-03-16 NOTE — Patient Instructions (Signed)
Continue primidone 50mg  (half tab BID); may try increasing to full tab BID as tolerated (if tremor worsens). Return in about 1 year, sooner as needed.  Tremor Tremor is a rhythmic, involuntary muscular contraction characterized by oscillations (to-and-fro movements) of a part of the body. The most common of all involuntary movements, tremor can affect various body parts such as the hands, head, facial structures, vocal cords, trunk, and legs; most tremors, however, occur in the hands. Tremor often accompanies neurological disorders associated with aging. Although the disorder is not life-threatening, it can be responsible for functional disability and social embarrassment. TREATMENT  There are many types of tremor and several ways in which tremor is classified. The most common classification is by behavioral context or position. There are five categories of tremor within this classification: resting, postural, kinetic, task-specific, and psychogenic. Resting or static tremor occurs when the muscle is at rest, for example when the hands are lying on the lap. This type of tremor is often seen in patients with Parkinson's disease. Postural tremor occurs when a patient attempts to maintain posture, such as holding the hands outstretched. Postural tremors include physiological tremor, essential tremor, tremor with basal ganglia disease (also seen in patients with Parkinson's disease), cerebellar postural tremor, tremor with peripheral neuropathy, post-traumatic tremor, and alcoholic tremor. Kinetic or intention (action) tremor occurs during purposeful movement, for example during finger-to-nose testing. Task-specific tremor appears when performing goal-oriented tasks such as handwriting, speaking, or standing. This group consists of primary writing tremor, vocal tremor, and orthostatic tremor. Psychogenic tremor occurs in both older and younger patients. The key feature of this tremor is that it dramatically  lessens or disappears when the patient is distracted. PROGNOSIS There are some treatment options available for tremor; the appropriate treatment depends on accurate diagnosis of the cause. Some tremors respond to treatment of the underlying condition, for example in some cases of psychogenic tremor treating the patient's underlying mental problem may cause the tremor to disappear. Also, patients with tremor due to Parkinson's disease may be treated with Levodopa drug therapy. Symptomatic drug therapy is available for several other tremors as well. For those cases of tremor in which there is no effective drug treatment, physical measures such as teaching the patient to brace the affected limb during the tremor are sometimes useful. Surgical intervention such as thalamotomy or deep brain stimulation may be useful in certain cases. Document Released: 06/14/2002 Document Revised: 09/16/2011 Document Reviewed: 06/24/2005 [facility] Patient Information 2015 [place], [place]. This information is not intended to replace advice given to you by your health care provider. Make sure you discuss any questions you have with your health care provider.

## 2014-03-16 NOTE — Progress Notes (Signed)
PATIENT: Jenna Howe DOB: Oct 30, 1940  REASON FOR VISIT: routine follow up for essential tremor HISTORY FROM: patient  HISTORY OF PRESENT ILLNESS: UPDATE 03/16/14 (LL):  Since last visit, tremor is well controlled, still taking half-tablet bid. No new complaints.  UPDATE 03/12/13: Since last visit patient is doing well. Continues primidone 25 mg twice a day. For 5 days per month, especially in mid afternoon, her tremor may slightly worsened. We discussed possibility of increased medication. Otherwise no complaints.  UPDATE 02/07/12: Tremor well controlled. Denies side effects to Primidone.  UPDATE 08/09/11: Tremor better with tasks (makeup). Feeling less fatigued now that she is accustomed to dose. Had several changes in B/P meds since last seen. Remains elevated today. Last seen by Dr. Leta Baptist 01/23/11.  PRIOR HPI (01/23/11): 73 year old right-handed female with hypertension, history of cervical laminectomy and discectomy, arthritis, bilateral total knee replacements, here for evaluation of tremor since Jan 2012.  The patient reports gradual, progressive tremor of bilateral upper extremities (left greater than right), mainly when she is doing specific tasks such as applying makeup, putting on earrings, using a spoon or drinking from a glass. She denies any tremor at rest. She denies any new smell or taste difficulties, sleep disturbances or walking problems. She denies any swallowing difficulties. She has a brother with Parkinson's disease. No other family history of tremor.   REVIEW OF SYSTEMS: Full 14 system review of systems performed and notable only for: fatigue  ALLERGIES: Allergies  Allergen Reactions  . Celebrex [Celecoxib] Rash  . Penicillins Rash  . Sulfa Antibiotics Rash    HOME MEDICATIONS: Outpatient Prescriptions Prior to Visit  Medication Sig Dispense Refill  . amLODipine-valsartan (EXFORGE) 10-320 MG per tablet Take 1 tablet by mouth daily.      Marland Kitchen BIOTIN 5000 PO Take  5,000 mcg by mouth once.      . bumetanide (BUMEX) 1 MG tablet Take 1 mg by mouth daily.       . Calcium Citrate-Vitamin D (CITRACAL + D PO) Take 1 tablet by mouth 2 (two) times daily.       . Cholecalciferol (VITAMIN D3) 1000 UNITS CAPS Take 1,000 capsules by mouth daily.      . fish oil-omega-3 fatty acids 1000 MG capsule Take 1 g by mouth daily.       Marland Kitchen loratadine (CLARITIN) 10 MG tablet Take 10 mg by mouth daily.       . meloxicam (MOBIC) 7.5 MG tablet Take 7.5 mg by mouth 2 (two) times daily.       . methocarbamol (ROBAXIN) 500 MG tablet Take 500 mg by mouth every 8 (eight) hours as needed. MUSCLE SPASM       . oxycodone (OXY-IR) 5 MG capsule Take 5 mg by mouth every 6 (six) hours as needed. PAIN      . oxyCODONE-acetaminophen (PERCOCET) 5-325 MG per tablet Take 1 tablet by mouth every 4 (four) hours as needed. PAIN       . Polysaccharide Iron Complex (FERREX 150 PO) Take 150 mg by mouth once.      . Travoprost, BAK Free, (TRAVATAN) 0.004 % SOLN ophthalmic solution Place 1 drop into both eyes at bedtime.       . vitamin C (ASCORBIC ACID) 500 MG tablet Take 500 mg by mouth daily.       . primidone (MYSOLINE) 50 MG tablet TAKE 1/2 A TABLET TWICE A DAY  90 tablet  3   No facility-administered medications prior to visit.  PHYSICAL EXAM Filed Vitals:   03/16/14 1053  BP: 133/71  Pulse: 82  Height: 5\' 4"  (1.626 m)  Weight: 155 lb 9.6 oz (70.58 kg)   Body mass index is 26.7 kg/(m^2).  General: Patient is awake, alert and in no acute distress. Well developed and groomed.  Neck: Neck is supple.  Cardiovascular: Heart is regular rate and rhythm with no murmurs.   Neurologic Exam  Mental Status: Awake, alert. Language is fluent and comprehension intact.  Cranial Nerves: Pupils are equal and reactive to light. Visual fields are full to confrontation. Conjugate eye movements are full and symmetric. Facial sensation and strength are symmetric. Hearing is intact. Palate elevated  symmetrically and uvula is midline. Shoulder shrug is symmetric. Tongue is midline. Neg myerson's sign.  Motor: Normal bulk and tone. MINIMAL POSTURAL TREMOR IN LUE. NO REST TREMOR. NO BRADYKINESIA OR COGWHEELING. Full strength in the upper and lower extremities. No pronator drift.  Sensory: .  Coordination: No ataxia or dysmetria on finger-nose or rapid alternating movement testing.  Gait and Station: CAUTIOUS, LIMPING GAIT, DUE TO DIFFERENCES OF LEG LENGTH (RIGHT LEG SHORTER).  Reflexes: Deep tendon reflexes in the UPPER EXT ARE 3, ABSENT AT KNEES (BILATERAL KNEE REPLACEMENTS), AND 1 AT ANKLES.   ASSESSMENT: 73 y.o. female with essential tremor. No bradykinesia, rigidity or parkinsonian gait. Good benefit with primidone.   PLAN: Continue primidone 50mg  (half tab BID); may try increasing to full tab BID as tolerated (if tremor worsens). Return in about 1 year, sooner as needed.  Meds ordered this encounter  Medications  . primidone (MYSOLINE) 50 MG tablet    Sig: TAKE 1/2 A TABLET TWICE A DAY    Dispense:  90 tablet    Refill:  3    Order Specific Question:  Supervising Provider    Answer:  Andrey Spearman R [[id number]]   Rudi Rummage Kailany Dinunzio, MSN, FNP-BC, A/GNP-C 03/16/2014, 11:18 AM [facility] [address] [place],  [address] [phone]  Note: This document was prepared with digital dictation and possible smart phrase technology. Any transcriptional errors that result from this process are unintentional.

## 2014-03-31 NOTE — Progress Notes (Signed)
I reviewed note and agree with plan.   Delma Villalva R. Rupal Childress, MD  Certified in Neurology, Neurophysiology and Neuroimaging  [facility] [address], [address] [place], [place] [address] [phone]   

## 2014-05-25 ENCOUNTER — Encounter: Payer: Self-pay | Admitting: Diagnostic Neuroimaging

## 2014-07-18 ENCOUNTER — Encounter: Payer: Self-pay | Admitting: Internal Medicine

## 2014-07-20 ENCOUNTER — Encounter: Payer: Self-pay | Admitting: Internal Medicine

## 2014-08-02 ENCOUNTER — Ambulatory Visit (AMBULATORY_SURGERY_CENTER): Payer: Self-pay

## 2014-08-02 VITALS — Ht 64.0 in | Wt 155.8 lb

## 2014-08-02 DIAGNOSIS — Z1211 Encounter for screening for malignant neoplasm of colon: Secondary | ICD-10-CM

## 2014-08-02 NOTE — Progress Notes (Signed)
Per pt, no allergies to soy or egg products.Pt not taking any weight loss meds or using  O2 at home. 

## 2014-08-11 ENCOUNTER — Encounter: Payer: Self-pay | Admitting: Internal Medicine

## 2014-08-11 ENCOUNTER — Ambulatory Visit (AMBULATORY_SURGERY_CENTER): Payer: 59 | Admitting: Internal Medicine

## 2014-08-11 VITALS — BP 163/90 | HR 69 | Temp 96.4°F | Resp 11 | Ht 64.0 in | Wt 155.0 lb

## 2014-08-11 DIAGNOSIS — D122 Benign neoplasm of ascending colon: Secondary | ICD-10-CM

## 2014-08-11 DIAGNOSIS — Z1211 Encounter for screening for malignant neoplasm of colon: Secondary | ICD-10-CM

## 2014-08-11 MED ORDER — SODIUM CHLORIDE 0.9 % IV SOLN: 500.0000 mL | INTRAVENOUS | Status: DC

## 2014-08-11 NOTE — Patient Instructions (Addendum)
I found and removed 2 tiny polyps that look benign. I do not think you will need any more routine colonoscopy.  I will let you know results and recommendations by letter.  I appreciate the opportunity to care for you. Gatha Mayer, MD, FACG  YOU HAD AN ENDOSCOPIC PROCEDURE TODAY AT [facility]: Refer to the procedure report that was given to you for any specific questions about what was found during the examination.  If the procedure report does not answer your questions, please call your gastroenterologist to clarify.  If you requested that your care partner not be given the details of your procedure findings, then the procedure report has been included in a sealed envelope for you to review at your convenience later.  YOU SHOULD EXPECT: Some feelings of bloating in the abdomen. Passage of more gas than usual.  Walking can help get rid of the air that was put into your GI tract during the procedure and reduce the bloating. If you had a lower endoscopy (such as a colonoscopy or flexible sigmoidoscopy) you may notice spotting of blood in your stool or on the toilet paper. If you underwent a bowel prep for your procedure, then you may not have a normal bowel movement for a few days.  DIET: Your first meal following the procedure should be a light meal and then it is ok to progress to your normal diet.  A half-sandwich or bowl of soup is an example of a good first meal.  Heavy or fried foods are harder to digest and may make you feel nauseous or bloated.  Likewise meals heavy in dairy and vegetables can cause extra gas to form and this can also increase the bloating.  Drink plenty of fluids but you should avoid alcoholic beverages for 24 hours.  ACTIVITY: Your care partner should take you home directly after the procedure.  You should plan to take it easy, moving slowly for the rest of the day.  You can resume normal activity the day after the procedure however you should NOT  DRIVE or use heavy machinery for 24 hours (because of the sedation medicines used during the test).    SYMPTOMS TO REPORT IMMEDIATELY: A gastroenterologist can be reached at any hour.  During normal business hours, 8:30 AM to 5:00 PM Monday through Friday, call [phone].  After hours and on weekends, please call the GI answering service at [phone] who will take a message and have the physician on call contact you.   Following lower endoscopy (colonoscopy or flexible sigmoidoscopy):  Excessive amounts of blood in the stool  Significant tenderness or worsening of abdominal pains  Swelling of the abdomen that is new, acute  Fever of 100F or higher  FOLLOW UP: If any biopsies were taken you will be contacted by phone or by letter within the next 1-3 weeks.  Call your gastroenterologist if you have not heard about the biopsies in 3 weeks.  Our staff will call the home number listed on your records the next business day following your procedure to check on you and address any questions or concerns that you may have at that time regarding the information given to you following your procedure. This is a courtesy call and so if there is no answer at the home number and we have not heard from you through the emergency physician on call, we will assume that you have returned to your regular daily activities without incident.  SIGNATURES/CONFIDENTIALITY:  You and/or your care partner have signed paperwork which will be entered into your electronic medical record.  These signatures attest to the fact that that the information above on your After Visit Summary has been reviewed and is understood.  Full responsibility of the confidentiality of this discharge information lies with you and/or your care-partner.

## 2014-08-11 NOTE — Op Note (Signed)
[place]  [address] [place], [id number]   COLONOSCOPY PROCEDURE REPORT  PATIENT: Jenna Howe, Jenna Howe  MR#: [id number] BIRTHDATE: 12-15-1940 , 49  yrs. old GENDER: female ENDOSCOPIST: Gatha Mayer, MD, [facility] PROCEDURE DATE:  08/11/2014 PROCEDURE:   Colonoscopy with biopsy First Screening Colonoscopy - Avg.  risk and is 50 yrs.  old or older - No.  Prior Negative Screening - Now for repeat screening. 10 or more years since last screening  History of Adenoma - Now for follow-up colonoscopy & has been > or = to 3 yrs.  N/A  Polyps Removed Today? Yes. ASA CLASS:   Class II INDICATIONS:average risk for colorectal cancer. MEDICATIONS: Propofol 200 mg IV and Monitored anesthesia care  DESCRIPTION OF PROCEDURE:   After the risks benefits and alternatives of the procedure were thoroughly explained, informed consent was obtained.  The digital rectal exam revealed no abnormalities of the rectum.   The LB [id number]  endoscope was introduced through the anus and advanced to the cecum, which was identified by both the appendix and ileocecal valve. No adverse events experienced.   The quality of the prep was good, using MiraLax  The instrument was then slowly withdrawn as the colon was fully examined.      COLON FINDINGS: 1) Two flat diminutive ascending colon polyps removed by cold forceps technique and sent to pathology. 2) Otherwise normal colonoscopy.  Retroflexed views revealed no abnormalities. The time to cecum=1 minutes 49 seconds.  Withdrawal time=9 minutes 57 seconds.  The scope was withdrawn and the procedure completed. COMPLICATIONS: There were no immediate complications.  ENDOSCOPIC IMPRESSION: 1) Two flat diminutive ascending colon polyps removed by cold forceps technique and sent to pathology. 2) Otherwise normal colonoscopy - good prep  RECOMMENDATIONS: Doubt she will need routine repeat colonoscopy - await pathology  to confirm.  eSigned:  Gatha Mayer, MD, [facility] 08/11/2014 3:24 PM   cc: The Patient and Wenda Low, MD

## 2014-08-11 NOTE — Progress Notes (Signed)
Called to room to assist during endoscopic procedure.  Patient ID and intended procedure confirmed with present staff. Received instructions for my participation in the procedure from the performing physician.  

## 2014-08-11 NOTE — Progress Notes (Signed)
A/ox3 pleased with MAC, report to Karen RN 

## 2014-08-18 ENCOUNTER — Encounter: Payer: Self-pay | Admitting: Internal Medicine

## 2014-08-18 DIAGNOSIS — Z86010 Personal history of colonic polyps: Secondary | ICD-10-CM

## 2014-08-18 DIAGNOSIS — Z860101 Personal history of adenomatous and serrated colon polyps: Secondary | ICD-10-CM

## 2014-08-18 HISTORY — DX: Personal history of colonic polyps: Z86.010

## 2014-08-18 HISTORY — DX: Personal history of adenomatous and serrated colon polyps: Z86.0101

## 2014-08-18 NOTE — Progress Notes (Signed)
Quick Note:  1 adenoma - repeat colon 2021 ______

## 2014-08-18 NOTE — Progress Notes (Signed)
Quick Note:  Correction - does not need routine repeat colonoscopy ______

## 2014-10-14 ENCOUNTER — Other Ambulatory Visit: Payer: Self-pay

## 2014-10-14 DIAGNOSIS — Z1231 Encounter for screening mammogram for malignant neoplasm of breast: Secondary | ICD-10-CM

## 2014-10-24 DIAGNOSIS — H4011X2 Primary open-angle glaucoma, moderate stage: Secondary | ICD-10-CM | POA: Diagnosis not present

## 2014-11-24 ENCOUNTER — Ambulatory Visit
Admission: RE | Admit: 2014-11-24 | Discharge: 2014-11-24 | Disposition: A | Discharge status: Home / Self Care | Payer: BC Managed Care – PPO | Source: Ambulatory Visit

## 2014-11-24 DIAGNOSIS — Z1231 Encounter for screening mammogram for malignant neoplasm of breast: Secondary | ICD-10-CM

## 2015-01-17 DIAGNOSIS — I10 Essential (primary) hypertension: Secondary | ICD-10-CM | POA: Diagnosis not present

## 2015-01-17 DIAGNOSIS — M1990 Unspecified osteoarthritis, unspecified site: Secondary | ICD-10-CM | POA: Diagnosis not present

## 2015-01-17 DIAGNOSIS — R7309 Other abnormal glucose: Secondary | ICD-10-CM | POA: Diagnosis not present

## 2015-01-17 DIAGNOSIS — E782 Mixed hyperlipidemia: Secondary | ICD-10-CM | POA: Diagnosis not present

## 2015-01-17 DIAGNOSIS — D509 Iron deficiency anemia, unspecified: Secondary | ICD-10-CM | POA: Diagnosis not present

## 2015-01-17 DIAGNOSIS — G250 Essential tremor: Secondary | ICD-10-CM | POA: Diagnosis not present

## 2015-01-17 DIAGNOSIS — M5090 Cervical disc disorder, unspecified, unspecified cervical region: Secondary | ICD-10-CM | POA: Diagnosis not present

## 2015-01-17 DIAGNOSIS — Z0000 Encounter for general adult medical examination without abnormal findings: Secondary | ICD-10-CM | POA: Diagnosis not present

## 2015-01-17 DIAGNOSIS — N183 Chronic kidney disease, stage 3 (moderate): Secondary | ICD-10-CM | POA: Diagnosis not present

## 2015-03-08 DIAGNOSIS — N951 Menopausal and female climacteric states: Secondary | ICD-10-CM | POA: Diagnosis not present

## 2015-03-08 DIAGNOSIS — Z01411 Encounter for gynecological examination (general) (routine) with abnormal findings: Secondary | ICD-10-CM | POA: Diagnosis not present

## 2015-03-17 ENCOUNTER — Ambulatory Visit: Payer: Medicare Other | Admitting: Nurse Practitioner

## 2015-03-17 ENCOUNTER — Ambulatory Visit: Payer: Medicare Other | Admitting: Adult Health

## 2015-03-22 ENCOUNTER — Ambulatory Visit: Payer: BC Managed Care – PPO | Admitting: Adult Health

## 2015-03-27 ENCOUNTER — Encounter: Payer: Self-pay | Admitting: Adult Health

## 2015-03-27 ENCOUNTER — Ambulatory Visit (INDEPENDENT_AMBULATORY_CARE_PROVIDER_SITE_OTHER): Payer: Medicare PPO | Admitting: Adult Health

## 2015-03-27 VITALS — BP 166/71 | HR 76 | Ht 62.0 in | Wt 158.0 lb

## 2015-03-27 DIAGNOSIS — Z5181 Encounter for therapeutic drug level monitoring: Secondary | ICD-10-CM

## 2015-03-27 DIAGNOSIS — G250 Essential tremor: Secondary | ICD-10-CM | POA: Diagnosis not present

## 2015-03-27 MED ORDER — PRIMIDONE 50 MG PO TABS: ORAL_TABLET | ORAL | Status: DC

## 2015-03-27 NOTE — Progress Notes (Signed)
PATIENT: Jenna Howe DOB: Jan 17, 1941  REASON FOR VISIT: follow up- essential tremor HISTORY FROM: patient  HISTORY OF PRESENT ILLNESS: Jenna Howe is a 74 year old female with a history of essential tremor. She returns today for follow-up. She is currently taking primidone 25 mg twice a day. She reports that her tremor is under good control. She states that if she is under a lot of stress or in a rush it'll become more prevalent. The tremor primarily affects the left hand. She denies any new neurological symptoms. She returns today for an evaluation.  HISTORY  UPDATE 03/16/14 (LL): Since last visit, tremor is well controlled, still taking half-tablet bid. No new complaints.  UPDATE 03/12/13: Since last visit patient is doing well. Continues primidone 25 mg twice a day. For 5 days per month, especially in mid afternoon, her tremor may slightly worsened. We discussed possibility of increased medication. Otherwise no complaints.  UPDATE 02/07/12: Tremor well controlled. Denies side effects to Primidone.  UPDATE 08/09/11: Tremor better with tasks (makeup). Feeling less fatigued now that she is accustomed to dose. Had several changes in B/P meds since last seen. Remains elevated today. Last seen by Dr. Leta Baptist 01/23/11.  PRIOR HPI (01/23/11): 74 year old right-handed female with hypertension, history of cervical laminectomy and discectomy, arthritis, bilateral total knee replacements, here for evaluation of tremor since Jan 2012.  The patient reports gradual, progressive tremor of bilateral upper extremities (left greater than right), mainly when she is doing specific tasks such as applying makeup, putting on earrings, using a spoon or drinking from a glass. She denies any tremor at rest. She denies any new smell or taste difficulties, sleep disturbances or walking problems. She denies any swallowing difficulties. She has a brother with Parkinson's disease. No other family history of tremor.   REVIEW  OF SYSTEMS: Out of a complete 14 system review of symptoms, the patient complains only of the following symptoms, and all other reviewed systems are negative.  See history of present illness  ALLERGIES: Allergies  Allergen Reactions  . Clinoril [Sulindac]     Stomach upset  . Celebrex [Celecoxib] Rash    Stomach upset  . Penicillins Rash  . Sulfa Antibiotics Rash    HOME MEDICATIONS: Outpatient Prescriptions Prior to Visit  Medication Sig Dispense Refill  . amLODipine-valsartan (EXFORGE) 10-320 MG per tablet Take 1 tablet by mouth daily.    Marland Kitchen BIOTIN 5000 PO Take 5,000 mcg by mouth once.    . bumetanide (BUMEX) 1 MG tablet Take 1 mg by mouth daily.     . Calcium Citrate-Vitamin D (CITRACAL + D PO) Take 1 tablet by mouth 2 (two) times daily.     . Cholecalciferol (VITAMIN D3) 1000 UNITS CAPS Take 1,000 capsules by mouth daily.    . clindamycin (CLEOCIN) 150 MG capsule Take by mouth. Take 4 capsules orally prior to dental procedure.    . Ferrous Gluconate 325 (36 FE) MG TABS Take by mouth daily.    . fish oil-omega-3 fatty acids 1000 MG capsule Take 1 g by mouth daily. Take 2 capsules daily    . latanoprost (XALATAN) 0.005 % ophthalmic solution Place 1 drop into both eyes at bedtime.    Marland Kitchen loratadine (CLARITIN) 10 MG tablet Take 10 mg by mouth daily.     . methocarbamol (ROBAXIN) 500 MG tablet Take 500 mg by mouth daily as needed. MUSCLE SPASM     . Multiple Vitamin (MULTIVITAMIN) tablet Take 1 tablet by mouth daily.    Marland Kitchen  oxycodone (OXY-IR) 5 MG capsule Take 5 mg by mouth every 6 (six) hours as needed. PAIN    . oxyCODONE-acetaminophen (PERCOCET) 5-325 MG per tablet Take 1 tablet by mouth every 4 (four) hours as needed. PAIN     . Polysaccharide Iron Complex (FERREX 150 PO) Take 150 mg by mouth once.    . primidone (MYSOLINE) 50 MG tablet TAKE 1/2 A TABLET TWICE A DAY 90 tablet 3  . simvastatin (ZOCOR) 10 MG tablet Take 10 mg by mouth daily.    . traMADol (ULTRAM) 50 MG tablet Take  by mouth 2 (two) times daily.    . Travoprost, BAK Free, (TRAVATAN) 0.004 % SOLN ophthalmic solution Place 1 drop into both eyes at bedtime.     . vitamin C (ASCORBIC ACID) 500 MG tablet Take 500 mg by mouth daily.     Marland Kitchen HYDROcodone-acetaminophen (NORCO) 7.5-325 MG per tablet Take 1 tablet by mouth every 6 (six) hours as needed for moderate pain.    . meloxicam (MOBIC) 7.5 MG tablet Take 7.5 mg by mouth 2 (two) times daily.     Marland Kitchen triamcinolone cream (KENALOG) 0.5 % Apply 1 application topically. Apply to affected area bid     No facility-administered medications prior to visit.    PAST MEDICAL HISTORY: Past Medical History  Diagnosis Date  . Hypertension   . Anemia   . Blood transfusion     10/12 after knee surgery   . Arthritis     arthritis in knees and hands   . Essential tremor   . Blood transfusion without reported diagnosis   . Glaucoma   . Hx of adenomatous polyp of colon 08/18/2014    PAST SURGICAL HISTORY: Past Surgical History  Procedure Laterality Date  . Joint replacement      right knee 10/12 , left knee 2006   . Other surgical history      right knee replacement 8/11   . Cervical laminectomy  1995   . Cholecystectomy      1999  . Knee closed reduction  07/10/2011    Procedure: CLOSED MANIPULATION KNEE;  Surgeon: Gearlean Alf;  Location: [facility];  Service: Orthopedics;  Laterality: Right;  . Colonoscopy      FAMILY HISTORY: Family History  Problem Relation Age of Onset  . Stroke Mother   . Parkinsonism Mother   . Diabetes Mother   . Colon cancer Neg Hx   . Esophageal cancer Neg Hx   . Rectal cancer Neg Hx   . Stomach cancer Neg Hx     SOCIAL HISTORY: Social History   Social History  . Marital Status: Married    Spouse Name: clifford  . Number of Children: 1  . Years of Education: MA   Occupational History  . retired    Social History Main Topics  . Smoking status: Never Smoker   . Smokeless tobacco: Former Systems developer    Types: [place]  date: 05/08/2014  . Alcohol Use: No  . Drug Use: No  . Sexual Activity: Not on file   Other Topics Concern  . Not on file   Social History Narrative   Patient lives at home with her spouse.   Caffeine Use: 1 cup of coffee   Patient is right handed.       PHYSICAL EXAM  Filed Vitals:   03/27/15 1349  BP: 166/71  Pulse: 76  Height: 5\' 2"  (1.575 m)  Weight: 158 lb (71.668 kg)  Body mass index is 28.89 kg/(m^2).  Generalized: Well developed, in no acute distress   Neurological examination  Mentation: Alert oriented to time, place, history taking. Follows all commands speech and language fluent Cranial nerve II-XII: Pupils were equal round reactive to light. Extraocular movements were full, visual field were full on confrontational test. Facial sensation and strength were normal. Uvula tongue midline. Head turning and shoulder shrug  were normal and symmetric. Motor: The motor testing reveals 5 over 5 strength of all 4 extremities. Good symmetric motor tone is noted throughout.  Sensory: Sensory testing is intact to soft touch on all 4 extremities. No evidence of extinction is noted.  Coordination: Cerebellar testing reveals good finger-nose-finger and heel-to-shin bilaterally. Mild intention tremor noted in the left hand Gait and station: Gait is normal. Tandem gait is normal. Romberg is negative. No drift is seen.  Reflexes: Deep tendon reflexes are symmetric and normal bilaterally.   DIAGNOSTIC DATA (LABS, IMAGING, TESTING) - I reviewed patient records, labs, notes, testing and imaging myself where available.     ASSESSMENT AND PLAN 74 y.o. year old female  has a past medical history of Hypertension; Anemia; Blood transfusion; Arthritis; Essential tremor; Blood transfusion without reported diagnosis; Glaucoma; and adenomatous polyp of colon (08/18/2014). here with:  1. Essential tremor  Overall the patient is doing well. She will continue on primidone 50 mg half a  tablet twice a day. I will check blood work today. Patient advised that if her tremor worsens or she develops new symptoms she should let us know. She will follow-up in one year or sooner if needed.  Ward Givens, MSN, NP-C 03/27/2015, 2:09 PM [facility] [address] [place], [place] [address] [phone]

## 2015-03-27 NOTE — Progress Notes (Signed)
I reviewed note and agree with plan.   Penni Bombard, MD 7/73/7366, 8:15 PM Certified in Neurology, Neurophysiology and Neuroimaging  [facility] [address], [place], [place] [address] [phone]

## 2015-03-27 NOTE — Patient Instructions (Signed)
Continue Mysoline 1/2 tablet twice a day. If your symptoms worsen or you develop new symptoms please let us know.

## 2015-03-28 ENCOUNTER — Telehealth: Payer: Self-pay

## 2015-03-28 LAB — PRIMIDONE, SERUM
Phenobarbital, Serum: <3 ug/mL — ABNORMAL LOW (ref 15–40)
Primidone Lvl: 3.6 ug/mL — ABNORMAL LOW (ref 5.0–12.0)

## 2015-03-28 NOTE — Telephone Encounter (Signed)
-----   Message from Ward Givens, NP sent at 03/28/2015  7:31 AM EDT ----- Lab work is ok. Please call patient.

## 2015-03-28 NOTE — Telephone Encounter (Signed)
Called and spoke to patient relayed blood work was ok. Patient verbalized understanding.

## 2015-03-30 DIAGNOSIS — Z96652 Presence of left artificial knee joint: Secondary | ICD-10-CM | POA: Diagnosis not present

## 2015-03-30 DIAGNOSIS — Z96653 Presence of artificial knee joint, bilateral: Secondary | ICD-10-CM | POA: Diagnosis not present

## 2015-03-30 DIAGNOSIS — Z96651 Presence of right artificial knee joint: Secondary | ICD-10-CM | POA: Diagnosis not present

## 2015-03-30 DIAGNOSIS — Z471 Aftercare following joint replacement surgery: Secondary | ICD-10-CM | POA: Diagnosis not present

## 2015-04-17 DIAGNOSIS — H2513 Age-related nuclear cataract, bilateral: Secondary | ICD-10-CM | POA: Diagnosis not present

## 2015-04-17 DIAGNOSIS — H52203 Unspecified astigmatism, bilateral: Secondary | ICD-10-CM | POA: Diagnosis not present

## 2015-04-17 DIAGNOSIS — H401132 Primary open-angle glaucoma, bilateral, moderate stage: Secondary | ICD-10-CM | POA: Diagnosis not present

## 2015-04-26 ENCOUNTER — Other Ambulatory Visit: Payer: Self-pay | Admitting: Internal Medicine

## 2015-04-26 DIAGNOSIS — R600 Localized edema: Secondary | ICD-10-CM

## 2015-04-26 DIAGNOSIS — Z23 Encounter for immunization: Secondary | ICD-10-CM | POA: Diagnosis not present

## 2015-04-27 ENCOUNTER — Ambulatory Visit
Admission: RE | Admit: 2015-04-27 | Discharge: 2015-04-27 | Disposition: A | Discharge status: Home / Self Care | Payer: Medicare PPO | Source: Ambulatory Visit | Attending: Internal Medicine | Admitting: Internal Medicine

## 2015-04-27 DIAGNOSIS — R600 Localized edema: Secondary | ICD-10-CM | POA: Diagnosis not present

## 2015-07-20 DIAGNOSIS — R7309 Other abnormal glucose: Secondary | ICD-10-CM | POA: Diagnosis not present

## 2015-07-20 DIAGNOSIS — N183 Chronic kidney disease, stage 3 (moderate): Secondary | ICD-10-CM | POA: Diagnosis not present

## 2015-07-20 DIAGNOSIS — I10 Essential (primary) hypertension: Secondary | ICD-10-CM | POA: Diagnosis not present

## 2015-07-20 DIAGNOSIS — M1990 Unspecified osteoarthritis, unspecified site: Secondary | ICD-10-CM | POA: Diagnosis not present

## 2015-07-20 DIAGNOSIS — M5136 Other intervertebral disc degeneration, lumbar region: Secondary | ICD-10-CM | POA: Diagnosis not present

## 2015-07-20 DIAGNOSIS — E782 Mixed hyperlipidemia: Secondary | ICD-10-CM | POA: Diagnosis not present

## 2015-07-20 DIAGNOSIS — G250 Essential tremor: Secondary | ICD-10-CM | POA: Diagnosis not present

## 2015-10-16 DIAGNOSIS — H401122 Primary open-angle glaucoma, left eye, moderate stage: Secondary | ICD-10-CM | POA: Diagnosis not present

## 2015-10-16 DIAGNOSIS — H401112 Primary open-angle glaucoma, right eye, moderate stage: Secondary | ICD-10-CM | POA: Diagnosis not present

## 2015-10-31 ENCOUNTER — Other Ambulatory Visit: Payer: Self-pay

## 2015-10-31 DIAGNOSIS — Z1231 Encounter for screening mammogram for malignant neoplasm of breast: Secondary | ICD-10-CM

## 2015-11-14 DIAGNOSIS — H401132 Primary open-angle glaucoma, bilateral, moderate stage: Secondary | ICD-10-CM | POA: Diagnosis not present

## 2015-11-27 ENCOUNTER — Ambulatory Visit
Admission: RE | Admit: 2015-11-27 | Discharge: 2015-11-27 | Disposition: A | Discharge status: Home / Self Care | Payer: Medicare PPO | Source: Ambulatory Visit

## 2015-11-27 DIAGNOSIS — Z1231 Encounter for screening mammogram for malignant neoplasm of breast: Secondary | ICD-10-CM

## 2016-01-18 DIAGNOSIS — Z1389 Encounter for screening for other disorder: Secondary | ICD-10-CM | POA: Diagnosis not present

## 2016-01-18 DIAGNOSIS — Z6828 Body mass index (BMI) 28.0-28.9, adult: Secondary | ICD-10-CM | POA: Diagnosis not present

## 2016-01-18 DIAGNOSIS — R7303 Prediabetes: Secondary | ICD-10-CM | POA: Diagnosis not present

## 2016-01-18 DIAGNOSIS — I10 Essential (primary) hypertension: Secondary | ICD-10-CM | POA: Diagnosis not present

## 2016-01-18 DIAGNOSIS — D509 Iron deficiency anemia, unspecified: Secondary | ICD-10-CM | POA: Diagnosis not present

## 2016-01-18 DIAGNOSIS — G250 Essential tremor: Secondary | ICD-10-CM | POA: Diagnosis not present

## 2016-01-18 DIAGNOSIS — Z0000 Encounter for general adult medical examination without abnormal findings: Secondary | ICD-10-CM | POA: Diagnosis not present

## 2016-01-18 DIAGNOSIS — E782 Mixed hyperlipidemia: Secondary | ICD-10-CM | POA: Diagnosis not present

## 2016-01-18 DIAGNOSIS — N183 Chronic kidney disease, stage 3 (moderate): Secondary | ICD-10-CM | POA: Diagnosis not present

## 2016-01-18 DIAGNOSIS — M1990 Unspecified osteoarthritis, unspecified site: Secondary | ICD-10-CM | POA: Diagnosis not present

## 2016-03-13 ENCOUNTER — Other Ambulatory Visit: Payer: Self-pay | Admitting: Internal Medicine

## 2016-03-13 ENCOUNTER — Ambulatory Visit
Admission: RE | Admit: 2016-03-13 | Discharge: 2016-03-13 | Disposition: A | Discharge status: Home / Self Care | Payer: Medicare PPO | Source: Ambulatory Visit | Attending: Internal Medicine | Admitting: Internal Medicine

## 2016-03-13 DIAGNOSIS — R1010 Upper abdominal pain, unspecified: Secondary | ICD-10-CM | POA: Diagnosis not present

## 2016-03-13 DIAGNOSIS — M47816 Spondylosis without myelopathy or radiculopathy, lumbar region: Secondary | ICD-10-CM | POA: Diagnosis not present

## 2016-03-13 DIAGNOSIS — R109 Unspecified abdominal pain: Secondary | ICD-10-CM

## 2016-03-13 DIAGNOSIS — M519 Unspecified thoracic, thoracolumbar and lumbosacral intervertebral disc disorder: Secondary | ICD-10-CM

## 2016-03-19 ENCOUNTER — Ambulatory Visit
Admission: RE | Admit: 2016-03-19 | Discharge: 2016-03-19 | Disposition: A | Discharge status: Home / Self Care | Payer: Medicare PPO | Source: Ambulatory Visit | Attending: Internal Medicine | Admitting: Internal Medicine

## 2016-03-19 DIAGNOSIS — R109 Unspecified abdominal pain: Secondary | ICD-10-CM

## 2016-03-19 DIAGNOSIS — Z23 Encounter for immunization: Secondary | ICD-10-CM | POA: Diagnosis not present

## 2016-03-19 DIAGNOSIS — M545 Low back pain: Secondary | ICD-10-CM | POA: Diagnosis not present

## 2016-03-25 ENCOUNTER — Ambulatory Visit (INDEPENDENT_AMBULATORY_CARE_PROVIDER_SITE_OTHER): Payer: Medicare PPO | Admitting: Adult Health

## 2016-03-25 ENCOUNTER — Encounter: Payer: Self-pay | Admitting: Adult Health

## 2016-03-25 VITALS — BP 142/75 | HR 66 | Ht 62.0 in | Wt 158.2 lb

## 2016-03-25 DIAGNOSIS — H2513 Age-related nuclear cataract, bilateral: Secondary | ICD-10-CM | POA: Diagnosis not present

## 2016-03-25 DIAGNOSIS — H401112 Primary open-angle glaucoma, right eye, moderate stage: Secondary | ICD-10-CM | POA: Diagnosis not present

## 2016-03-25 DIAGNOSIS — Z5181 Encounter for therapeutic drug level monitoring: Secondary | ICD-10-CM | POA: Diagnosis not present

## 2016-03-25 DIAGNOSIS — G250 Essential tremor: Secondary | ICD-10-CM | POA: Diagnosis not present

## 2016-03-25 DIAGNOSIS — H401122 Primary open-angle glaucoma, left eye, moderate stage: Secondary | ICD-10-CM | POA: Diagnosis not present

## 2016-03-25 MED ORDER — PRIMIDONE 50 MG PO TABS: ORAL_TABLET | ORAL | 3 refills | Status: DC

## 2016-03-25 NOTE — Patient Instructions (Signed)
Continue primidone I will check blood work today If your symptoms worsen or you develop new symptoms please let us know.

## 2016-03-25 NOTE — Progress Notes (Signed)
PATIENT: Jenna Howe DOB: [birth year]  REASON FOR VISIT: follow up-essential tremor HISTORY FROM: patient  HISTORY OF PRESENT ILLNESS: Today 03/25/2016 : Jenna Howe is a 75 year old female with a history of essential tremor. She returns today for follow-up. She is currently taking primidone 25 mg twice a day. She reports that this controls her tremor quite well. She is able to eat and dress herself without any difficulty. She states that stress is a trigger for her tremor. She feels that her tremor has improved. She denies any new neurological symptoms. She returns today for an evaluation.  HISTORY 03/27/15: Jenna Howe is a 75 year old female with a history of essential tremor. She returns today for follow-up. She is currently taking primidone 25 mg twice a day. She reports that her tremor is under good control. She states that if she is under a lot of stress or in a rush it'll become more prevalent. The tremor primarily affects the left hand. She denies any new neurological symptoms. She returns today for an evaluation  PRIOR HPI (01/23/11): 75 year old right-handed female with hypertension, history of cervical laminectomy and discectomy, arthritis, bilateral total knee replacements, here for evaluation of tremor since Jan 2012.  The patient reports gradual, progressive tremor of bilateral upper extremities (left greater than right), mainly when she is doing specific tasks such as applying makeup, putting on earrings, using a spoon or drinking from a glass. She denies any tremor at rest. She denies any new smell or taste difficulties, sleep disturbances or walking problems. She denies any swallowing difficulties. She has a brother with Parkinson's disease. No other family history of tremor  REVIEW OF SYSTEMS: Out of a complete 14 system review of symptoms, the patient complains only of the following symptoms, and all other reviewed systems are negative.  ALLERGIES: Allergies  Allergen  Reactions  . Clinoril [Sulindac]     Stomach upset  . Celebrex [Celecoxib] Rash    Stomach upset  . Penicillins Rash  . Sulfa Antibiotics Rash    HOME MEDICATIONS: Outpatient Medications Prior to Visit  Medication Sig Dispense Refill  . amLODipine-valsartan (EXFORGE) 10-320 MG per tablet Take 1 tablet by mouth daily.    Marland Kitchen BIOTIN 5000 PO Take 5,000 mcg by mouth once.    . bumetanide (BUMEX) 1 MG tablet Take 1 mg by mouth daily.     . Calcium Citrate-Vitamin D (CITRACAL + D PO) Take 1 tablet by mouth 2 (two) times daily.     . Cholecalciferol (VITAMIN D3) 1000 UNITS CAPS Take 1,000 capsules by mouth daily.    . clindamycin (CLEOCIN) 150 MG capsule Take by mouth. Take 4 capsules orally prior to dental procedure.    . Ferrous Gluconate 325 (36 FE) MG TABS Take by mouth daily.    . fish oil-omega-3 fatty acids 1000 MG capsule Take 1 g by mouth daily. Take 2 capsules daily    . latanoprost (XALATAN) 0.005 % ophthalmic solution Place 1 drop into both eyes at bedtime.    Marland Kitchen loratadine (CLARITIN) 10 MG tablet Take 10 mg by mouth daily.     . methocarbamol (ROBAXIN) 500 MG tablet Take 500 mg by mouth daily as needed. MUSCLE SPASM     . Multiple Vitamin (MULTIVITAMIN) tablet Take 1 tablet by mouth daily.    Marland Kitchen oxycodone (OXY-IR) 5 MG capsule Take 5 mg by mouth every 6 (six) hours as needed. PAIN    . oxyCODONE-acetaminophen (PERCOCET) 5-325 MG per tablet Take 1 tablet by mouth  every 4 (four) hours as needed. PAIN     . Polysaccharide Iron Complex (FERREX 150 PO) Take 150 mg by mouth once.    . primidone (MYSOLINE) 50 MG tablet TAKE 1/2 A TABLET TWICE A DAY 90 tablet 4  . simvastatin (ZOCOR) 10 MG tablet Take 10 mg by mouth daily.    . traMADol (ULTRAM) 50 MG tablet Take by mouth 2 (two) times daily.    . Travoprost, BAK Free, (TRAVATAN) 0.004 % SOLN ophthalmic solution Place 1 drop into both eyes at bedtime.     . vitamin C (ASCORBIC ACID) 500 MG tablet Take 500 mg by mouth daily.      No  facility-administered medications prior to visit.     PAST MEDICAL HISTORY: Past Medical History:  Diagnosis Date  . Anemia   . Arthritis    arthritis in knees and hands   . Blood transfusion    10/12 after knee surgery   . Blood transfusion without reported diagnosis   . Essential tremor   . Glaucoma   . Hx of adenomatous polyp of colon 08/18/2014  . Hypertension     PAST SURGICAL HISTORY: Past Surgical History:  Procedure Laterality Date  . CERVICAL LAMINECTOMY  1995   . CHOLECYSTECTOMY     1999  . COLONOSCOPY    . JOINT REPLACEMENT     right knee 10/12 , left knee 2006   . KNEE CLOSED REDUCTION  07/10/2011   Procedure: CLOSED MANIPULATION KNEE;  Surgeon: Gearlean Alf;  Location: [facility];  Service: Orthopedics;  Laterality: Right;  . OTHER SURGICAL HISTORY     right knee replacement 8/11     FAMILY HISTORY: Family History  Problem Relation Age of Onset  . Stroke Mother   . Parkinsonism Mother   . Diabetes Mother   . Colon cancer Neg Hx   . Esophageal cancer Neg Hx   . Rectal cancer Neg Hx   . Stomach cancer Neg Hx     SOCIAL HISTORY: Social History   Social History  . Marital status: Married    Spouse name: clifford  . Number of children: 1  . Years of education: MA   Occupational History  . retired    Social History Main Topics  . Smoking status: Never Smoker  . Smokeless tobacco: Former Systems developer    Types: Egan date: 05/08/2014  . Alcohol use No  . Drug use: No  . Sexual activity: Not on file   Other Topics Concern  . Not on file   Social History Narrative   Patient lives at home with her spouse.   Caffeine Use: 1 cup of coffee   Patient is right handed.       PHYSICAL EXAM  Vitals:   03/25/16 1407  Weight: 158 lb 3.2 oz (71.8 kg)  Height: 5\' 2"  (1.575 m)   Body mass index is 28.94 kg/m.  Generalized: Well developed, in no acute distress   Neurological examination  Mentation: Alert oriented to time, place, history taking.  Follows all commands speech and language fluent Cranial nerve II-XII: Pupils were equal round reactive to light. Extraocular movements were full, visual field were full on confrontational test. Facial sensation and strength were normal. Uvula tongue midline. Head turning and shoulder shrug  were normal and symmetric. Motor: The motor testing reveals 5 over 5 strength of all 4 extremities. Good symmetric motor tone is noted throughout.  Sensory: Sensory testing is intact to soft  touch on all 4 extremities. No evidence of extinction is noted.  Coordination: Cerebellar testing reveals good finger-nose-finger and heel-to-shin bilaterally.  Gait and station: Gait is normal. Tandem gait is normal. Romberg is negative. No drift is seen.  Reflexes: Deep tendon reflexes are symmetric and normal bilaterally.   DIAGNOSTIC DATA (LABS, IMAGING, TESTING) - I reviewed patient records, labs, notes, testing and imaging myself where available.     ASSESSMENT AND PLAN 75 y.o. year old female  has a past medical history of Anemia; Arthritis; Blood transfusion; Blood transfusion without reported diagnosis; Essential tremor; Glaucoma; adenomatous polyp of colon (08/18/2014); and Hypertension. here with:  1. Essential tremor  Overall the patient is doing well. Her tremor has remained stable. She will continue on primidone 25 mg twice a day. I will check blood work today. Patient advised that if her symptoms worsen or she develops any new symptoms she should let us know. Follow-up in one year with Dr. Wynetta Emery, MSN, NP-C 03/25/2016, 2:07 PM [facility] [address] [place], [place] [address] [phone]

## 2016-03-26 LAB — PHENOBARBITAL LEVEL: Phenobarbital, Serum: 4 ug/mL — ABNORMAL LOW (ref 15–40)

## 2016-03-26 LAB — PRIMIDONE, SERUM
Phenobarbital, Serum: NOT DETECTED ug/mL (ref 15–40)
Primidone Lvl: 3.9 ug/mL — ABNORMAL LOW (ref 5.0–12.0)

## 2016-03-27 ENCOUNTER — Telehealth: Payer: Self-pay | Admitting: *Deleted

## 2016-03-27 NOTE — Telephone Encounter (Signed)
-----   Message from Ward Givens, NP sent at 03/26/2016  4:57 PM EDT ----- Lab work unremarkable. Please call patient

## 2016-03-27 NOTE — Telephone Encounter (Signed)
Spoke to pt and let her know that her lab work results were unremarkable.  She verbalized understanding.

## 2016-03-29 NOTE — Progress Notes (Signed)
I reviewed note and agree with plan.   VIKRAM R. PENUMALLI, MD  Certified in Neurology, Neurophysiology and Neuroimaging  [facility] [address], [address] [place], [place] [address] [phone]   

## 2016-06-20 DIAGNOSIS — Z471 Aftercare following joint replacement surgery: Secondary | ICD-10-CM | POA: Diagnosis not present

## 2016-06-20 DIAGNOSIS — Z96653 Presence of artificial knee joint, bilateral: Secondary | ICD-10-CM | POA: Diagnosis not present

## 2016-07-05 NOTE — Progress Notes (Signed)
Please place SURGICAL ORDERS IN EPIC--has pre op 07/11/16  thanks

## 2016-07-09 ENCOUNTER — Ambulatory Visit: Payer: Self-pay | Admitting: Orthopedic Surgery

## 2016-07-11 ENCOUNTER — Encounter (HOSPITAL_COMMUNITY): Payer: Self-pay

## 2016-07-11 ENCOUNTER — Encounter (HOSPITAL_COMMUNITY)
Admission: RE | Admit: 2016-07-11 | Discharge: 2016-07-11 | Disposition: A | Discharge status: Home / Self Care | Payer: Medicare PPO | Source: Ambulatory Visit | Attending: Orthopedic Surgery | Admitting: Orthopedic Surgery

## 2016-07-11 DIAGNOSIS — Z01812 Encounter for preprocedural laboratory examination: Secondary | ICD-10-CM | POA: Diagnosis not present

## 2016-07-11 DIAGNOSIS — M1711 Unilateral primary osteoarthritis, right knee: Secondary | ICD-10-CM | POA: Insufficient documentation

## 2016-07-11 DIAGNOSIS — Z01810 Encounter for preprocedural cardiovascular examination: Secondary | ICD-10-CM | POA: Diagnosis not present

## 2016-07-11 HISTORY — DX: Allergy status to unspecified drugs, medicaments and biological substances: Z88.9

## 2016-07-11 HISTORY — DX: Tremor, unspecified: R25.1

## 2016-07-11 LAB — BASIC METABOLIC PANEL
Anion gap: 8 (ref 5–15)
BUN: 15 mg/dL (ref 6–20)
CALCIUM: 8.8 mg/dL — AB (ref 8.9–10.3)
CO2: 32 mmol/L (ref 22–32)
CREATININE: 1.06 mg/dL — AB (ref 0.44–1.00)
Chloride: 101 mmol/L (ref 101–111)
GFR, EST AFRICAN AMERICAN: 58 mL/min — AB (ref >60)
GFR, EST NON AFRICAN AMERICAN: 50 mL/min — AB (ref >60)
Glucose, Bld: 116 mg/dL — ABNORMAL HIGH (ref 65–99)
Potassium: 3.7 mmol/L (ref 3.5–5.1)
SODIUM: 141 mmol/L (ref 135–145)

## 2016-07-11 LAB — CBC
HCT: 33.9 % — ABNORMAL LOW (ref 36.0–46.0)
Hemoglobin: 10.9 g/dL — ABNORMAL LOW (ref 12.0–15.0)
MCH: 22.3 pg — AB (ref 26.0–34.0)
MCHC: 32.2 g/dL (ref 30.0–36.0)
MCV: 69.3 fL — ABNORMAL LOW (ref 78.0–100.0)
PLATELETS: 341 10*3/uL (ref 150–400)
RBC: 4.89 MIL/uL (ref 3.87–5.11)
RDW: 15.9 % — ABNORMAL HIGH (ref 11.5–15.5)
WBC: 9.5 10*3/uL (ref 4.0–10.5)

## 2016-07-11 NOTE — Patient Instructions (Addendum)
Jenna Howe  07/11/2016   Your procedure is scheduled on: 07-17-16  Report to [facility]  Entrance take [facility]  elevators to 3rd floor to  [place] at  1:00 PM.  Call this number if you have problems the morning of surgery [phone]   Remember: ONLY 1 PERSON MAY GO WITH YOU TO SHORT STAY TO GET  READY MORNING OF [place].  Do not eat food or drink liquids :After Midnight. Exception, may have Clear liquids 12 midnight to 0900 AM, then nothing.     Take these medicines the morning of surgery with A SIP OF WATER: Claritin.Tramadol. Primidone. Tylenol-if need.  Eye drops-usual. DO NOT TAKE ANY DIABETIC MEDICATIONS DAY OF YOUR SURGERY                               You may not have any metal on your body including hair pins and              piercings  Do not wear jewelry, make-up, lotions, powders or perfumes, deodorant             Do not wear nail polish.  Do not shave  48 hours prior to surgery.              Men may shave face and neck.   Do not bring valuables to the hospital. [place].  Contacts, dentures or bridgework may not be worn into surgery.  Leave suitcase in the car. After surgery it may be brought to your room.     Patients discharged the day of surgery will not be allowed to drive home.  Name and phone number of your driver: Jenna Howe -spouse [phone] cell  Special Instructions: N/A              Please read over the following fact sheets you were given: _____________________________________________________________________             [facility] - Preparing for Surgery Before surgery, you can play an important role.  Because skin is not sterile, your skin needs to be as free of germs as possible.  You can reduce the number of germs on your skin by washing with CHG (chlorahexidine gluconate) soap before surgery.  CHG is an antiseptic cleaner which kills germs and bonds with  the skin to continue killing germs even after washing. Please DO NOT use if you have an allergy to CHG or antibacterial soaps.  If your skin becomes reddened/irritated stop using the CHG and inform your nurse when you arrive at Short Stay. Do not shave (including legs and underarms) for at least 48 hours prior to the first CHG shower.  You may shave your face/neck. Please follow these instructions carefully:  1.  Shower with CHG Soap the night before surgery and the  morning of Surgery.  2.  If you choose to wash your hair, wash your hair first as usual with your  normal  shampoo.  3.  After you shampoo, rinse your hair and body thoroughly to remove the  shampoo.                           4.  Use CHG as you would any other liquid soap.  You can apply chg directly  to the skin and wash                       Gently with a scrungie or clean washcloth.  5.  Apply the CHG Soap to your body ONLY FROM THE NECK DOWN.   Do not use on face/ open                           Wound or open sores. Avoid contact with eyes, ears mouth and genitals (private parts).                       Wash face,  Genitals (private parts) with your normal soap.             6.  Wash thoroughly, paying special attention to the area where your surgery  will be performed.  7.  Thoroughly rinse your body with warm water from the neck down.  8.  DO NOT shower/wash with your normal soap after using and rinsing off  the CHG Soap.                9.  Pat yourself dry with a clean towel.            10.  Wear clean pajamas.            11.  Place clean sheets on your bed the night of your first shower and do not  sleep with pets. Day of Surgery : Do not apply any lotions/deodorants the morning of surgery.  Please wear clean clothes to the hospital/surgery center.  FAILURE TO FOLLOW THESE INSTRUCTIONS MAY RESULT IN THE CANCELLATION OF YOUR SURGERY PATIENT SIGNATURE_________________________________  NURSE  SIGNATURE__________________________________  ________________________________________________________________________  Adam Phenix  An incentive spirometer is a tool that can help keep your lungs clear and active. This tool measures how well you are filling your lungs with each breath. Taking long deep breaths may help reverse or decrease the chance of developing breathing (pulmonary) problems (especially infection) following:  A long period of time when you are unable to move or be active. BEFORE THE PROCEDURE   If the spirometer includes an indicator to show your best effort, your nurse or respiratory therapist will set it to a desired goal.  If possible, sit up straight or lean slightly forward. Try not to slouch.  Hold the incentive spirometer in an upright position. INSTRUCTIONS FOR USE  1. Sit on the edge of your bed if possible, or sit up as far as you can in bed or on a chair. 2. Hold the incentive spirometer in an upright position. 3. Breathe out normally. 4. Place the mouthpiece in your mouth and seal your lips tightly around it. 5. Breathe in slowly and as deeply as possible, raising the piston or the ball toward the top of the column. 6. Hold your breath for 3-5 seconds or for as long as possible. Allow the piston or ball to fall to the bottom of the column. 7. Remove the mouthpiece from your mouth and breathe out normally. 8. Rest for a few seconds and repeat Steps 1 through 7 at least 10 times every 1-2 hours when you are awake. Take your time and take a few normal breaths between deep breaths. 9. The spirometer may include an indicator to show your best effort.  Use the indicator as a goal to work toward during each repetition. 10. After each set of 10 deep breaths, practice coughing to be sure your lungs are clear. If you have an incision (the cut made at the time of surgery), support your incision when coughing by placing a pillow or rolled up towels firmly against  it. Once you are able to get out of bed, walk around indoors and cough well. You may stop using the incentive spirometer when instructed by your caregiver.  RISKS AND COMPLICATIONS  Take your time so you do not get dizzy or light-headed.  If you are in pain, you may need to take or ask for pain medication before doing incentive spirometry. It is harder to take a deep breath if you are having pain. AFTER USE  Rest and breathe slowly and easily.  It can be helpful to keep track of a log of your progress. Your caregiver can provide you with a simple table to help with this. If you are using the spirometer at home, follow these instructions: [place] IF:   You are having difficultly using the spirometer.  You have trouble using the spirometer as often as instructed.  Your pain medication is not giving enough relief while using the spirometer.  You develop fever of 100.5 F (38.1 C) or higher. SEEK IMMEDIATE MEDICAL CARE IF:   You cough up bloody sputum that had not been present before.  You develop fever of 102 F (38.9 C) or greater.  You develop worsening pain at or near the incision site. MAKE SURE YOU:   Understand these instructions.  Will watch your condition.  Will get help right away if you are not doing well or get worse. Document Released: 11/04/2006 Document Revised: 09/16/2011 Document Reviewed: 01/05/2007 [facility] Patient Information 2014 [place], [place].   ________________________________________________________________________

## 2016-07-17 ENCOUNTER — Observation Stay (HOSPITAL_COMMUNITY)
Admission: RE | Admit: 2016-07-17 | Discharge: 2016-07-18 | Disposition: A | Discharge status: Home / Self Care | Payer: Medicare PPO | Source: Ambulatory Visit | Attending: Orthopedic Surgery | Admitting: Orthopedic Surgery

## 2016-07-17 ENCOUNTER — Ambulatory Visit (HOSPITAL_COMMUNITY): Payer: Medicare PPO | Admitting: Anesthesiology

## 2016-07-17 ENCOUNTER — Encounter (HOSPITAL_COMMUNITY)
Admission: RE | Disposition: A | Discharge status: Home / Self Care | Payer: Self-pay | Source: Ambulatory Visit | Attending: Orthopedic Surgery

## 2016-07-17 ENCOUNTER — Encounter (HOSPITAL_COMMUNITY): Payer: Self-pay | Admitting: *Deleted

## 2016-07-17 DIAGNOSIS — M25551 Pain in right hip: Secondary | ICD-10-CM | POA: Diagnosis not present

## 2016-07-17 DIAGNOSIS — Z96651 Presence of right artificial knee joint: Secondary | ICD-10-CM | POA: Insufficient documentation

## 2016-07-17 DIAGNOSIS — M549 Dorsalgia, unspecified: Secondary | ICD-10-CM | POA: Diagnosis not present

## 2016-07-17 DIAGNOSIS — L928 Other granulomatous disorders of the skin and subcutaneous tissue: Secondary | ICD-10-CM | POA: Diagnosis not present

## 2016-07-17 DIAGNOSIS — H409 Unspecified glaucoma: Secondary | ICD-10-CM | POA: Diagnosis not present

## 2016-07-17 DIAGNOSIS — G8918 Other acute postprocedural pain: Secondary | ICD-10-CM | POA: Diagnosis not present

## 2016-07-17 DIAGNOSIS — L905 Scar conditions and fibrosis of skin: Secondary | ICD-10-CM | POA: Diagnosis not present

## 2016-07-17 DIAGNOSIS — Z86010 Personal history of colonic polyps: Secondary | ICD-10-CM | POA: Insufficient documentation

## 2016-07-17 DIAGNOSIS — R251 Tremor, unspecified: Secondary | ICD-10-CM | POA: Diagnosis not present

## 2016-07-17 DIAGNOSIS — G250 Essential tremor: Secondary | ICD-10-CM | POA: Diagnosis not present

## 2016-07-17 DIAGNOSIS — M23331 Other meniscus derangements, other medial meniscus, right knee: Secondary | ICD-10-CM | POA: Diagnosis not present

## 2016-07-17 DIAGNOSIS — M1990 Unspecified osteoarthritis, unspecified site: Secondary | ICD-10-CM | POA: Diagnosis not present

## 2016-07-17 DIAGNOSIS — M24661 Ankylosis, right knee: Secondary | ICD-10-CM | POA: Diagnosis not present

## 2016-07-17 DIAGNOSIS — T8482XA Fibrosis due to internal orthopedic prosthetic devices, implants and grafts, initial encounter: Secondary | ICD-10-CM | POA: Diagnosis present

## 2016-07-17 DIAGNOSIS — M24669 Ankylosis, unspecified knee: Secondary | ICD-10-CM | POA: Diagnosis present

## 2016-07-17 DIAGNOSIS — I10 Essential (primary) hypertension: Secondary | ICD-10-CM | POA: Insufficient documentation

## 2016-07-17 DIAGNOSIS — M23361 Other meniscus derangements, other lateral meniscus, right knee: Secondary | ICD-10-CM | POA: Diagnosis not present

## 2016-07-17 DIAGNOSIS — D649 Anemia, unspecified: Secondary | ICD-10-CM | POA: Insufficient documentation

## 2016-07-17 HISTORY — PX: KNEE ARTHROTOMY: SHX5881

## 2016-07-17 LAB — CBC
HEMATOCRIT: 33.8 % — AB (ref 36.0–46.0)
Hemoglobin: 8.5 g/dL — ABNORMAL LOW (ref 12.0–15.0)
MCH: 17.9 pg — AB (ref 26.0–34.0)
MCHC: 25.1 g/dL — ABNORMAL LOW (ref 30.0–36.0)
MCV: 71.3 fL — AB (ref 78.0–100.0)
Platelets: 237 10*3/uL (ref 150–400)
RBC: 4.74 MIL/uL (ref 3.87–5.11)
RDW: 16.3 % — AB (ref 11.5–15.5)
WBC: 8.6 10*3/uL (ref 4.0–10.5)

## 2016-07-17 LAB — CREATININE, SERUM
Creatinine, Ser: 0.98 mg/dL (ref 0.44–1.00)
GFR calc non Af Amer: 55 mL/min — ABNORMAL LOW (ref >60)

## 2016-07-17 SURGERY — ARTHROTOMY, KNEE
Anesthesia: Regional | Laterality: Right

## 2016-07-17 MED ORDER — MIDAZOLAM HCL 2 MG/2ML IJ SOLN
INTRAMUSCULAR | Status: AC
  Filled 2016-07-17: qty 2

## 2016-07-17 MED ORDER — TRAMADOL HCL 50 MG PO TABS
50.0000 mg | ORAL_TABLET | Freq: Two times a day (BID) | ORAL | Status: DC
  Administered 2016-07-17 – 2016-07-18 (×2): 50 mg via ORAL
  Filled 2016-07-17 (×2): qty 1

## 2016-07-17 MED ORDER — METOCLOPRAMIDE HCL 5 MG PO TABS: 5.0000 mg | ORAL_TABLET | Freq: Three times a day (TID) | ORAL | Status: DC | PRN

## 2016-07-17 MED ORDER — LATANOPROST 0.005 % OP SOLN
1.0000 [drp] | Freq: Every day | OPHTHALMIC | Status: DC
  Administered 2016-07-17: 1 [drp] via OPHTHALMIC
  Filled 2016-07-17: qty 2.5

## 2016-07-17 MED ORDER — ONDANSETRON HCL 4 MG/2ML IJ SOLN: 4.0000 mg | Freq: Four times a day (QID) | INTRAMUSCULAR | Status: DC | PRN

## 2016-07-17 MED ORDER — LACTATED RINGERS IV SOLN
INTRAVENOUS | Status: DC
  Administered 2016-07-17 (×2): via INTRAVENOUS

## 2016-07-17 MED ORDER — ARTIFICIAL TEARS OP OINT
TOPICAL_OINTMENT | OPHTHALMIC | Status: AC
  Filled 2016-07-17: qty 3.5

## 2016-07-17 MED ORDER — ACETAMINOPHEN 10 MG/ML IV SOLN
INTRAVENOUS | Status: AC
  Filled 2016-07-17: qty 100

## 2016-07-17 MED ORDER — PHENYLEPHRINE 40 MCG/ML (10ML) SYRINGE FOR IV PUSH (FOR BLOOD PRESSURE SUPPORT)
PREFILLED_SYRINGE | INTRAVENOUS | Status: AC
  Filled 2016-07-17: qty 10

## 2016-07-17 MED ORDER — PRIMIDONE 50 MG PO TABS
25.0000 mg | ORAL_TABLET | Freq: Two times a day (BID) | ORAL | Status: DC
  Administered 2016-07-17 – 2016-07-18 (×2): 25 mg via ORAL
  Filled 2016-07-17 (×3): qty 0.5

## 2016-07-17 MED ORDER — ACETAMINOPHEN 325 MG PO TABS
650.0000 mg | ORAL_TABLET | Freq: Four times a day (QID) | ORAL | Status: DC | PRN
Start: 2016-07-18 — End: 2016-07-18

## 2016-07-17 MED ORDER — OXYCODONE HCL 5 MG PO TABS: 5.0000 mg | ORAL_TABLET | ORAL | Status: DC | PRN

## 2016-07-17 MED ORDER — CEFAZOLIN SODIUM-DEXTROSE 2-4 GM/100ML-% IV SOLN
2.0000 g | INTRAVENOUS | Status: AC
  Administered 2016-07-17: 2 g via INTRAVENOUS
  Filled 2016-07-17: qty 100

## 2016-07-17 MED ORDER — METOCLOPRAMIDE HCL 5 MG/ML IJ SOLN: 5.0000 mg | Freq: Three times a day (TID) | INTRAMUSCULAR | Status: DC | PRN

## 2016-07-17 MED ORDER — MORPHINE SULFATE (PF) 2 MG/ML IV SOLN: 1.0000 mg | INTRAVENOUS | Status: DC | PRN

## 2016-07-17 MED ORDER — BUPIVACAINE IN DEXTROSE 0.75-8.25 % IT SOLN
INTRATHECAL | Status: DC | PRN
  Administered 2016-07-17: 1.6 mL via INTRATHECAL

## 2016-07-17 MED ORDER — MIDAZOLAM HCL 5 MG/5ML IJ SOLN
INTRAMUSCULAR | Status: DC | PRN
  Administered 2016-07-17: 2 mg via INTRAVENOUS

## 2016-07-17 MED ORDER — ROPIVACAINE HCL 7.5 MG/ML IJ SOLN
INTRAMUSCULAR | Status: DC | PRN
  Administered 2016-07-17: 20 mL via PERINEURAL

## 2016-07-17 MED ORDER — AMLODIPINE BESYLATE 10 MG PO TABS
10.0000 mg | ORAL_TABLET | Freq: Once | ORAL | Status: AC
  Administered 2016-07-17: 10 mg via ORAL
  Filled 2016-07-17: qty 1

## 2016-07-17 MED ORDER — FENTANYL CITRATE (PF) 100 MCG/2ML IJ SOLN
INTRAMUSCULAR | Status: AC
  Filled 2016-07-17: qty 2

## 2016-07-17 MED ORDER — METHOCARBAMOL 500 MG PO TABS: 500.0000 mg | ORAL_TABLET | Freq: Four times a day (QID) | ORAL | Status: DC | PRN

## 2016-07-17 MED ORDER — ACETAMINOPHEN 500 MG PO TABS
1000.0000 mg | ORAL_TABLET | Freq: Four times a day (QID) | ORAL | Status: DC
  Administered 2016-07-17 – 2016-07-18 (×3): 1000 mg via ORAL
  Filled 2016-07-17 (×3): qty 2

## 2016-07-17 MED ORDER — OXYCODONE HCL 5 MG/5ML PO SOLN
5.0000 mg | Freq: Once | ORAL | Status: DC | PRN
  Filled 2016-07-17: qty 5

## 2016-07-17 MED ORDER — BUMETANIDE 1 MG PO TABS
1.0000 mg | ORAL_TABLET | Freq: Every day | ORAL | Status: DC
  Administered 2016-07-18: 1 mg via ORAL
  Filled 2016-07-17: qty 1

## 2016-07-17 MED ORDER — PHENYLEPHRINE 40 MCG/ML (10ML) SYRINGE FOR IV PUSH (FOR BLOOD PRESSURE SUPPORT)
PREFILLED_SYRINGE | INTRAVENOUS | Status: DC | PRN
  Administered 2016-07-17 (×5): 80 ug via INTRAVENOUS

## 2016-07-17 MED ORDER — PROMETHAZINE HCL 25 MG/ML IJ SOLN: 6.2500 mg | INTRAMUSCULAR | Status: DC | PRN

## 2016-07-17 MED ORDER — FERROUS GLUCONATE 324 (38 FE) MG PO TABS
324.0000 mg | ORAL_TABLET | Freq: Every day | ORAL | Status: DC
  Administered 2016-07-18: 324 mg via ORAL
  Filled 2016-07-17: qty 1

## 2016-07-17 MED ORDER — CEFAZOLIN SODIUM-DEXTROSE 2-4 GM/100ML-% IV SOLN
INTRAVENOUS | Status: AC
  Filled 2016-07-17: qty 100

## 2016-07-17 MED ORDER — ROPIVACAINE HCL 7.5 MG/ML IJ SOLN
INTRAMUSCULAR | Status: AC
  Filled 2016-07-17: qty 20

## 2016-07-17 MED ORDER — LORATADINE 10 MG PO TABS
10.0000 mg | ORAL_TABLET | Freq: Every day | ORAL | Status: DC
Start: 2016-07-18 — End: 2016-07-18
  Administered 2016-07-18: 10 mg via ORAL
  Filled 2016-07-17: qty 1

## 2016-07-17 MED ORDER — 0.9 % SODIUM CHLORIDE (POUR BTL) OPTIME
TOPICAL | Status: DC | PRN
  Administered 2016-07-17: 1000 mL

## 2016-07-17 MED ORDER — TIMOLOL MALEATE 0.5 % OP SOLN
1.0000 [drp] | Freq: Every day | OPHTHALMIC | Status: DC
  Administered 2016-07-18: 1 [drp] via OPHTHALMIC
  Filled 2016-07-17: qty 5

## 2016-07-17 MED ORDER — FENTANYL CITRATE (PF) 100 MCG/2ML IJ SOLN
INTRAMUSCULAR | Status: DC | PRN
  Administered 2016-07-17: 100 ug via INTRAVENOUS

## 2016-07-17 MED ORDER — ENOXAPARIN SODIUM 40 MG/0.4ML ~~LOC~~ SOLN
40.0000 mg | SUBCUTANEOUS | Status: DC
  Administered 2016-07-18: 40 mg via SUBCUTANEOUS
  Filled 2016-07-17 (×2): qty 0.4

## 2016-07-17 MED ORDER — MEPERIDINE HCL 50 MG/ML IJ SOLN: 6.2500 mg | INTRAMUSCULAR | Status: DC | PRN

## 2016-07-17 MED ORDER — CHLORHEXIDINE GLUCONATE 4 % EX LIQD: 60.0000 mL | Freq: Once | CUTANEOUS | Status: DC

## 2016-07-17 MED ORDER — PROPOFOL 10 MG/ML IV BOLUS
INTRAVENOUS | Status: AC
  Filled 2016-07-17: qty 20

## 2016-07-17 MED ORDER — ACETAMINOPHEN 10 MG/ML IV SOLN
1000.0000 mg | Freq: Once | INTRAVENOUS | Status: AC
  Administered 2016-07-17: 1000 mg via INTRAVENOUS
  Filled 2016-07-17: qty 100

## 2016-07-17 MED ORDER — DEXAMETHASONE SODIUM PHOSPHATE 10 MG/ML IJ SOLN
INTRAMUSCULAR | Status: AC
  Filled 2016-07-17: qty 1

## 2016-07-17 MED ORDER — METHOCARBAMOL 1000 MG/10ML IJ SOLN
500.0000 mg | Freq: Four times a day (QID) | INTRAVENOUS | Status: DC | PRN
  Administered 2016-07-17: 500 mg via INTRAVENOUS
  Filled 2016-07-17: qty 5
  Filled 2016-07-17: qty 550

## 2016-07-17 MED ORDER — PROPOFOL 10 MG/ML IV BOLUS
INTRAVENOUS | Status: AC
  Filled 2016-07-17: qty 40

## 2016-07-17 MED ORDER — SODIUM CHLORIDE 0.9 % IV SOLN
INTRAVENOUS | Status: DC
  Administered 2016-07-17: 19:00:00 via INTRAVENOUS

## 2016-07-17 MED ORDER — OXYCODONE HCL 5 MG PO TABS: 5.0000 mg | ORAL_TABLET | Freq: Once | ORAL | Status: DC | PRN

## 2016-07-17 MED ORDER — SIMVASTATIN 20 MG PO TABS
10.0000 mg | ORAL_TABLET | Freq: Every evening | ORAL | Status: DC
  Administered 2016-07-17: 10 mg via ORAL
  Filled 2016-07-17: qty 1

## 2016-07-17 MED ORDER — PROPOFOL 10 MG/ML IV BOLUS
INTRAVENOUS | Status: DC | PRN
  Administered 2016-07-17: 20 mg via INTRAVENOUS

## 2016-07-17 MED ORDER — ONDANSETRON HCL 4 MG PO TABS: 4.0000 mg | ORAL_TABLET | Freq: Four times a day (QID) | ORAL | Status: DC | PRN

## 2016-07-17 MED ORDER — PROPOFOL 500 MG/50ML IV EMUL
INTRAVENOUS | Status: DC | PRN
  Administered 2016-07-17: 100 ug/kg/min via INTRAVENOUS

## 2016-07-17 MED ORDER — IRBESARTAN 150 MG PO TABS
300.0000 mg | ORAL_TABLET | Freq: Every day | ORAL | Status: DC
  Administered 2016-07-18: 300 mg via ORAL
  Filled 2016-07-17: qty 2

## 2016-07-17 MED ORDER — ONDANSETRON HCL 4 MG/2ML IJ SOLN
INTRAMUSCULAR | Status: AC
  Filled 2016-07-17: qty 2

## 2016-07-17 MED ORDER — AMLODIPINE BESYLATE 10 MG PO TABS
10.0000 mg | ORAL_TABLET | Freq: Every day | ORAL | Status: DC
  Administered 2016-07-18: 10 mg via ORAL
  Filled 2016-07-17: qty 1

## 2016-07-17 MED ORDER — BUPIVACAINE HCL (PF) 0.25 % IJ SOLN
INTRAMUSCULAR | Status: AC
  Filled 2016-07-17: qty 30

## 2016-07-17 MED ORDER — VANCOMYCIN HCL IN DEXTROSE 1-5 GM/200ML-% IV SOLN
1000.0000 mg | Freq: Two times a day (BID) | INTRAVENOUS | Status: AC
  Administered 2016-07-17: 1000 mg via INTRAVENOUS
  Filled 2016-07-17: qty 200

## 2016-07-17 MED ORDER — HYDROMORPHONE HCL 1 MG/ML IJ SOLN: 0.2500 mg | INTRAMUSCULAR | Status: DC | PRN

## 2016-07-17 MED ORDER — ACETAMINOPHEN 650 MG RE SUPP: 650.0000 mg | Freq: Four times a day (QID) | RECTAL | Status: DC | PRN

## 2016-07-17 MED ORDER — AMLODIPINE BESYLATE-VALSARTAN 10-320 MG PO TABS: 1.0000 | ORAL_TABLET | Freq: Every day | ORAL | Status: DC

## 2016-07-17 SURGICAL SUPPLY — 37 items
BAG ZIPLOCK 12X15 (MISCELLANEOUS) ×2 IMPLANT
BANDAGE ACE 6X5 VEL STRL LF (GAUZE/BANDAGES/DRESSINGS) ×2 IMPLANT
BANDAGE ESMARK 6X9 LF (GAUZE/BANDAGES/DRESSINGS) ×1 IMPLANT
BNDG ESMARK 6X9 LF (GAUZE/BANDAGES/DRESSINGS) ×2
CUFF TOURN SGL QUICK 34 (TOURNIQUET CUFF) ×1
CUFF TRNQT CYL 34X4X40X1 (TOURNIQUET CUFF) ×1 IMPLANT
DRAPE EXTREMITY T 121X128X90 (DRAPE) ×2 IMPLANT
DRAPE U-SHAPE 47X51 STRL (DRAPES) ×2 IMPLANT
DRSG ADAPTIC 3X8 NADH LF (GAUZE/BANDAGES/DRESSINGS) ×2 IMPLANT
DRSG PAD ABDOMINAL 8X10 ST (GAUZE/BANDAGES/DRESSINGS) ×2 IMPLANT
DURAPREP 26ML APPLICATOR (WOUND CARE) ×2 IMPLANT
ELECT REM PT RETURN 9FT ADLT (ELECTROSURGICAL) ×2
ELECTRODE REM PT RTRN 9FT ADLT (ELECTROSURGICAL) ×1 IMPLANT
EVACUATOR 1/8 PVC DRAIN (DRAIN) ×2 IMPLANT
GAUZE SPONGE 4X4 12PLY STRL (GAUZE/BANDAGES/DRESSINGS) ×2 IMPLANT
GLOVE BIO SURGEON STRL SZ7.5 (GLOVE) ×2 IMPLANT
GLOVE BIO SURGEON STRL SZ8 (GLOVE) ×2 IMPLANT
GLOVE BIOGEL PI IND STRL 8 (GLOVE) ×1 IMPLANT
GLOVE BIOGEL PI INDICATOR 8 (GLOVE) ×1
GOWN STRL REUS W/TWL LRG LVL3 (GOWN DISPOSABLE) ×2 IMPLANT
GOWN STRL REUS W/TWL XL LVL3 (GOWN DISPOSABLE) ×4 IMPLANT
KIT BASIN OR (CUSTOM PROCEDURE TRAY) ×2 IMPLANT
MANIFOLD NEPTUNE II (INSTRUMENTS) ×2 IMPLANT
PACK TOTAL JOINT (CUSTOM PROCEDURE TRAY) ×2 IMPLANT
PADDING CAST ABS 6INX4YD NS (CAST SUPPLIES) ×2
PADDING CAST ABS COTTON 6X4 NS (CAST SUPPLIES) ×2 IMPLANT
PADDING CAST COTTON 6X4 STRL (CAST SUPPLIES) ×2 IMPLANT
POSITIONER SURGICAL ARM (MISCELLANEOUS) ×2 IMPLANT
STRIP CLOSURE SKIN 1/2X4 (GAUZE/BANDAGES/DRESSINGS) ×4 IMPLANT
SUT MNCRL AB 4-0 PS2 18 (SUTURE) ×2 IMPLANT
SUT VIC AB 1 CT1 27 (SUTURE) ×1
SUT VIC AB 1 CT1 27XBRD ANTBC (SUTURE) ×1 IMPLANT
SUT VIC AB 2-0 CT1 27 (SUTURE) ×3
SUT VIC AB 2-0 CT1 TAPERPNT 27 (SUTURE) ×3 IMPLANT
SUT VLOC 180 0 24IN GS25 (SUTURE) ×2 IMPLANT
TOWEL OR 17X26 10 PK STRL BLUE (TOWEL DISPOSABLE) ×2 IMPLANT
WRAP KNEE MAXI GEL POST OP (GAUZE/BANDAGES/DRESSINGS) ×2 IMPLANT

## 2016-07-17 NOTE — Brief Op Note (Signed)
07/17/2016  3:45 PM  PATIENT:  Jenna Howe  76 y.o. female  PRE-OPERATIVE DIAGNOSIS:  right knee arthrofibrosis  POST-OPERATIVE DIAGNOSIS:  right knee arthrofibrosis  PROCEDURE:  Procedure(s) with comments: KNEE ARTHROTOMY with scar excision (Right) - spinal+block  SURGEON:  Surgeon(s) and Role:    * Gaynelle Arabian, MD - Primary  PHYSICIAN ASSISTANT:   ASSISTANTS: Nehemiah Massed, PA-C   ANESTHESIA:   Adductor canal block and Spinal  EBL:  No intake/output data recorded.  BLOOD ADMINISTERED:none  DRAINS: (Medium) Hemovact drain(s) in the right knee with  Suction Open   LOCAL MEDICATIONS USED:  NONE  COUNTS:  YES  TOURNIQUET:   Total Tourniquet Time Documented: Thigh (Right) - 22 minutes Total: Thigh (Right) - 22 minutes   DICTATION: .Other Dictation: Dictation Number [id number]  PLAN OF CARE: Admit for overnight observation  PATIENT DISPOSITION:  PACU - hemodynamically stable.

## 2016-07-17 NOTE — Anesthesia Procedure Notes (Signed)
Spinal  Patient location during procedure: OR Start time: 07/17/2016 2:50 PM End time: 07/17/2016 2:55 PM Staffing Anesthesiologist: Nolon Nations Performed: anesthesiologist  Preanesthetic Checklist Completed: patient identified, site marked, surgical consent, pre-op evaluation, timeout performed, IV checked, risks and benefits discussed and monitors and equipment checked Spinal Block Patient position: sitting Prep: Betadine Patient monitoring: heart rate, continuous pulse ox and blood pressure Approach: left paramedian Location: L3-4 Injection technique: single-shot Needle Needle type: Sprotte  Needle gauge: 24 G Needle length: 9 cm Additional Notes Expiration date of kit checked and confirmed. Patient tolerated procedure well, without complications.

## 2016-07-17 NOTE — Anesthesia Preprocedure Evaluation (Addendum)
Anesthesia Evaluation  Patient identified by MRN, date of birth, ID band Patient awake    Reviewed: Allergy & Precautions, H&P , NPO status , Patient's Chart, lab work & pertinent test results  Airway Mallampati: II  TM Distance: >3 FB Neck ROM: full    Dental  (+) Dental Advisory Given, Teeth Intact Caps all front bottom:   Pulmonary neg pulmonary ROS,    Pulmonary exam normal breath sounds clear to auscultation       Cardiovascular Exercise Tolerance: Good hypertension, On Medications Normal cardiovascular exam Rhythm:regular Rate:Normal     Neuro/Psych negative neurological ROS  negative psych ROS   GI/Hepatic negative GI ROS, Neg liver ROS,   Endo/Other  negative endocrine ROS  Renal/GU negative Renal ROS     Musculoskeletal  (+) Arthritis ,   Abdominal   Peds  Hematology negative hematology ROS (+) anemia ,   Anesthesia Other Findings   Reproductive/Obstetrics negative OB ROS                             Anesthesia Physical  Anesthesia Plan  ASA: III  Anesthesia Plan: Spinal and Regional   Post-op Pain Management:  Regional for Post-op pain   Induction: Intravenous  Airway Management Planned:   Additional Equipment:   Intra-op Plan:   Post-operative Plan:   Informed Consent: I have reviewed the patients History and Physical, chart, labs and discussed the procedure including the risks, benefits and alternatives for the proposed anesthesia with the patient or authorized representative who has indicated his/her understanding and acceptance.   Dental advisory given  Plan Discussed with: CRNA  Anesthesia Plan Comments:       Anesthesia Quick Evaluation

## 2016-07-17 NOTE — Anesthesia Postprocedure Evaluation (Signed)
Anesthesia Post Note  Patient: Jenna Howe  Procedure(s) Performed: Procedure(s) (LRB): KNEE ARTHROTOMY with scar excision (Right)  Patient location during evaluation: PACU Anesthesia Type: Regional and Spinal Level of consciousness: awake and alert Pain management: pain level controlled Vital Signs Assessment: post-procedure vital signs reviewed and stable Respiratory status: spontaneous breathing and respiratory function stable Cardiovascular status: blood pressure returned to baseline and stable Postop Assessment: spinal receding Anesthetic complications: no       Last Vitals:  Vitals:   07/17/16 1730 07/17/16 1745  BP: (!) 144/71 (!) 164/87  Pulse: 63 77  Resp: 12 17  Temp:      Last Pain:  Vitals:   07/17/16 1745  TempSrc:   PainSc: 0-No pain                 Nolon Nations

## 2016-07-17 NOTE — Progress Notes (Signed)
Assisted Dr. Germeroth with right, ultrasound guided, adductor canal block. Side rails up, monitors on throughout procedure. See vital signs in flow sheet. Tolerated Procedure well. 

## 2016-07-17 NOTE — H&P (Signed)
CC- Jenna Howe is a 76 y.o. female who presents with right knee stiffness.  HPI- . Knee Pain: Patient presents with stiffness involving the  right knee. Onset of the symptoms was several years ago. Inciting event: She has had revision total knee arthroplasty and has had persistent stiffness which has worsened over time.She has pain associated with the stiffness.She is having discomfort in her hip and back due to altered gait. She desires more range of motion and presents now for arthrotomy and scar excision..    Past Medical History:  Diagnosis Date  . Anemia   . Arthritis    arthritis in knees and hands . Right wrist and thumb splinted due to pain.  . Blood transfusion    10/12 after knee surgery   . Blood transfusion without reported diagnosis   . Essential tremor   . Glaucoma   . H/O seasonal allergies    sinus congestion  . Hx of adenomatous polyp of colon 08/18/2014  . Hypertension   . Tremor of both hands    mild , tx. Primidone    Past Surgical History:  Procedure Laterality Date  . ABDOMINAL HYSTERECTOMY     Vaginal '88  . CERVICAL LAMINECTOMY  1995   cervical fusion"some limited ROM"  . CHOLECYSTECTOMY     1999  . COLONOSCOPY    . JOINT REPLACEMENT     right knee 10/12 , left knee 2006   . KNEE CLOSED REDUCTION  07/10/2011   Procedure: CLOSED MANIPULATION KNEE;  Surgeon: Gearlean Alf;  Location: [facility];  Service: Orthopedics;  Laterality: Right;  . OTHER SURGICAL HISTORY     right knee replacement 8/11     Prior to Admission medications   Medication Sig Start Date End Date Taking? Authorizing Provider  amLODipine-valsartan (EXFORGE) 10-320 MG per tablet Take 1 tablet by mouth daily.   Yes Historical Provider, MD  BIOTIN 5000 PO Take 5,000 mcg by mouth daily.    Yes Historical Provider, MD  bumetanide (BUMEX) 1 MG tablet Take 1 mg by mouth daily.    Yes Historical Provider, MD  Calcium Citrate-Vitamin D (CITRACAL + D PO) Take 1 tablet by mouth 2 (two) times  daily.    Yes Historical Provider, MD  Cholecalciferol (VITAMIN D3) 1000 UNITS CAPS Take 1,000 Units by mouth daily.    Yes Historical Provider, MD  ferrous gluconate (FERGON) 324 MG tablet Take 324 mg by mouth daily. 06/11/16  Yes Historical Provider, MD  fish oil-omega-3 fatty acids 1000 MG capsule Take 1 g by mouth 2 (two) times daily.    Yes Historical Provider, MD  latanoprost (XALATAN) 0.005 % ophthalmic solution Place 1 drop into both eyes at bedtime.   Yes Historical Provider, MD  loratadine (CLARITIN) 10 MG tablet Take 10 mg by mouth daily.    Yes Historical Provider, MD  Multiple Vitamin (MULTIVITAMIN) tablet Take 1 tablet by mouth daily.   Yes Historical Provider, MD  primidone (MYSOLINE) 50 MG tablet TAKE 1/2 A TABLET TWICE A DAY 03/25/16  Yes Ward Givens, NP  simvastatin (ZOCOR) 10 MG tablet Take 10 mg by mouth every evening.    Yes Historical Provider, MD  timolol (TIMOPTIC) 0.5 % ophthalmic solution Place 1 drop into both eyes daily. 06/20/16  Yes Historical Provider, MD  traMADol (ULTRAM) 50 MG tablet Take 50 mg by mouth 2 (two) times daily.    Yes Historical Provider, MD  vitamin C (ASCORBIC ACID) 500 MG tablet Take 500 mg by  mouth daily.    Yes Historical Provider, MD  clindamycin (CLEOCIN) 150 MG capsule Take 4 capsules orally prior to dental procedure.     Historical Provider, MD  methocarbamol (ROBAXIN) 500 MG tablet Take 500 mg by mouth 2 (two) times daily as needed for muscle spasms.     Historical Provider, MD  oxyCODONE-acetaminophen (PERCOCET) 5-325 MG per tablet Take 1 tablet by mouth every 4 (four) hours as needed for moderate pain.     Historical Provider, MD   KNEE EXAM antalgic gait, reduced range of motion (5-90 degrees), collateral ligaments intact  Physical Examination: General appearance - alert, well appearing, and in no distress Mental status - alert, oriented to person, place, and time Chest - clear to auscultation, no wheezes, rales or rhonchi, symmetric  air entry Heart - normal rate, regular rhythm, normal S1, S2, no murmurs, rubs, clicks or gallops Abdomen - soft, nontender, nondistended, no masses or organomegaly Neurological - alert, oriented, normal speech, no focal findings or movement disorder noted     Asessment/Plan--- Right knee arthrofibrosis- - Plan right knee arthrotomy with scar excision. Procedure risks and potential comps discussed with patient who elects to proceed. Goals are decreased pain and increased function with a high likelihood of achieving both

## 2016-07-17 NOTE — Anesthesia Procedure Notes (Addendum)
Anesthesia Regional Block:  Adductor canal block  Pre-Anesthetic Checklist: ,, timeout performed, Correct Patient, Correct Site, Correct Laterality, Correct Procedure, Correct Position, site marked, Risks and benefits discussed,  Surgical consent,  Pre-op evaluation,  At surgeon's request and post-op pain management  Laterality: Right  Prep: chloraprep       Needles:  Injection technique: Single-shot  Needle Type: Stimiplex     Needle Length: 9cm 9 cm Needle Gauge: 21 and 21 G    Additional Needles:  Procedures: ultrasound guided (picture in chart) Adductor canal block Narrative:  Start time: 07/17/2016 2:30 PM End time: 07/17/2016 2:35 PM Injection made incrementally with aspirations every 5 mL.  Performed by: Personally  Anesthesiologist: Nolon Nations  Additional Notes: BP cuff, EKG monitors applied. Sedation begun. Artery and nerve location verified with U/S and anesthetic injected incrementally, slowly, and after negative aspirations under direct u/s guidance. Good fascial /perineural spread. Tolerated well.

## 2016-07-17 NOTE — Transfer of Care (Signed)
Immediate Anesthesia Transfer of Care Note  Patient: ARLANDA BONSALL  Procedure(s) Performed: Procedure(s) with comments: KNEE ARTHROTOMY with scar excision (Right) - spinal+block  Patient Location: PACU  Anesthesia Type:Spinal  Level of Consciousness: awake and alert   Airway & Oxygen Therapy: Patient Spontanous Breathing and Patient connected to face mask oxygen  Post-op Assessment: Report given to RN and Post -op Vital signs reviewed and stable  Post vital signs: Reviewed and stable  Last Vitals:  Vitals:   07/17/16 1440 07/17/16 1441  BP: (!) 174/72   Pulse: 69 66  Resp: 12 11  Temp:      Last Pain:  Vitals:   07/17/16 1302  TempSrc: Oral      Patients Stated Pain Goal: 3 ([id number])  Complications: No apparent anesthesia complications

## 2016-07-18 ENCOUNTER — Encounter (HOSPITAL_COMMUNITY): Payer: Self-pay | Admitting: Orthopedic Surgery

## 2016-07-18 DIAGNOSIS — H409 Unspecified glaucoma: Secondary | ICD-10-CM | POA: Diagnosis not present

## 2016-07-18 DIAGNOSIS — M25551 Pain in right hip: Secondary | ICD-10-CM | POA: Diagnosis not present

## 2016-07-18 DIAGNOSIS — M24661 Ankylosis, right knee: Secondary | ICD-10-CM | POA: Diagnosis not present

## 2016-07-18 DIAGNOSIS — G250 Essential tremor: Secondary | ICD-10-CM | POA: Diagnosis not present

## 2016-07-18 DIAGNOSIS — Z86010 Personal history of colonic polyps: Secondary | ICD-10-CM | POA: Diagnosis not present

## 2016-07-18 DIAGNOSIS — R251 Tremor, unspecified: Secondary | ICD-10-CM | POA: Diagnosis not present

## 2016-07-18 DIAGNOSIS — M549 Dorsalgia, unspecified: Secondary | ICD-10-CM | POA: Diagnosis not present

## 2016-07-18 DIAGNOSIS — Z96651 Presence of right artificial knee joint: Secondary | ICD-10-CM | POA: Diagnosis not present

## 2016-07-18 DIAGNOSIS — I10 Essential (primary) hypertension: Secondary | ICD-10-CM | POA: Diagnosis not present

## 2016-07-18 MED ORDER — TRAMADOL HCL 50 MG PO TABS: 50.0000 mg | ORAL_TABLET | Freq: Four times a day (QID) | ORAL | 0 refills | Status: DC | PRN

## 2016-07-18 MED ORDER — OXYCODONE HCL 5 MG PO TABS: 5.0000 mg | ORAL_TABLET | ORAL | 0 refills | Status: DC | PRN

## 2016-07-18 MED ORDER — METHOCARBAMOL 500 MG PO TABS: 500.0000 mg | ORAL_TABLET | Freq: Four times a day (QID) | ORAL | 0 refills | Status: DC | PRN

## 2016-07-18 NOTE — Progress Notes (Signed)
   Subjective: 1 Day Post-Op Procedure(s) (LRB): KNEE ARTHROTOMY with scar excision (Right) Patient reports pain as mild.   Patient seen in rounds by Dr. Wynelle Link. Patient is well, but has had some minor complaints of pain in the knee, requiring pain medications We will start therapy today.  If they do well with therapy and meets all goals, then will allow home later today following therapy.  Home CPM ordered.  Will add directions to order and place into the DC paperwork. Plan is to go Home after therapy session here today.  No HHPT, will go straight to outpatient therapy.  Objective: Vital signs in last 24 hours: Temp:  [97.4 F (36.3 C)-97.9 F (36.6 C)] 97.4 F (36.3 C) (01/11 0559) Pulse Rate:  [57-86] 63 (01/11 0559) Resp:  [7-22] 16 (01/11 0559) BP: (129-191)/(55-117) 156/66 (01/11 0559) SpO2:  [96 %-100 %] 100 % (01/11 0559) Weight:  [69.4 kg (153 lb)] 69.4 kg (153 lb) (01/10 1845)  Intake/Output from previous day:  Intake/Output Summary (Last 24 hours) at 07/18/16 0745 Last data filed at 07/18/16 [id number]  Gross per 24 hour  Intake          3388.25 ml  Output              380 ml  Net          3008.25 ml    Intake/Output this shift: No intake/output data recorded.  Labs:  Recent Labs  07/17/16 1923  HGB 8.5*    Recent Labs  07/17/16 1923  WBC 8.6  RBC 4.74  HCT 33.8*  PLT 237    Recent Labs  07/17/16 1923  CREATININE 0.98   No results for input(s): LABPT, INR in the last 72 hours.  EXAM General - Patient is Alert, Appropriate and Oriented Extremity - Neurovascular intact Sensation intact distally Dorsiflexion/Plantar flexion intact Dressing - dressing C/D/I Motor Function - intact, moving foot and toes well on exam.  Hemovac pulled without difficulty.  Past Medical History:  Diagnosis Date  . Anemia   . Arthritis    arthritis in knees and hands . Right wrist and thumb splinted due to pain.  . Blood transfusion    10/12 after knee surgery   .  Blood transfusion without reported diagnosis   . Essential tremor   . Glaucoma   . H/O seasonal allergies    sinus congestion  . Hx of adenomatous polyp of colon 08/18/2014  . Hypertension   . Tremor of both hands    mild , tx. Primidone    Assessment/Plan: 1 Day Post-Op Procedure(s) (LRB): KNEE ARTHROTOMY with scar excision (Right) Principal Problem:   Arthrofibrosis of total knee arthroplasty (HCC) Active Problems:   Arthrofibrosis of knee joint  Estimated body mass index is 26.26 kg/m as calculated from the following:   Height as of this encounter: 5\' 4"  (1.626 m).   Weight as of this encounter: 69.4 kg (153 lb). Up with therapy Discharge home - straight to outpatient therapy  DVT Prophylaxis - Aspirin 325 mg daily for three weeks. Weight-Bearing as tolerated to right leg D/C O2 and Pulse OX and try on Room Air  If meets goals and able to go home: Up with therapy Discharge home Diet - Cardiac diet Follow up - in 2 weeks Activity - WBAT Disposition - Home Condition Upon Discharge - Good D/C Meds - See DC Summary DVT Prophylaxis - Aspirin 325 mg daily for three weeks.  Arlee Muslim, PA-C Orthopaedic Surgery

## 2016-07-18 NOTE — Evaluation (Signed)
Physical Therapy Evaluation Patient Details Name: Jenna Howe MRN: [id number] DOB: [birth year] Today's Date: 07/18/2016   History of Present Illness  Pt s/p R knee scar tissue excision and knee manipulation  Clinical Impression  Pt s/p R knee scar tissue excision and manipulation presents with decreased R LE strength/ROM and post op pain limiting functional mobility.  Pt plans dc home with family assist and follow up OP PT ASAP.    Follow Up Recommendations Outpatient PT    Equipment Recommendations  None recommended by PT    Recommendations for Other Services       Precautions / Restrictions Precautions Precautions: Knee;Fall Restrictions Weight Bearing Restrictions: No Other Position/Activity Restrictions: WBAT      Mobility  Bed Mobility Overal bed mobility: Modified Independent             General bed mobility comments: Pt to EOB unassisted  Transfers Overall transfer level: Needs assistance Equipment used: Rolling walker (2 wheeled) Transfers: Sit to/from Stand Sit to Stand: Min guard         General transfer comment: cues for LE management and use of UEs to self assist  Ambulation/Gait Ambulation/Gait assistance: Min guard;Supervision Ambulation Distance (Feet): 123 Feet Assistive device: Rolling walker (2 wheeled) Gait Pattern/deviations: Step-to pattern;Step-through pattern;Decreased step length - right;Decreased step length - left;Shuffle;Trunk flexed Gait velocity: decr Gait velocity interpretation: Below normal speed for age/gender General Gait Details: cues for posture, position from RW and initial sequence  Stairs Stairs: Yes Stairs assistance: Min assist Stair Management: No rails;Step to pattern;Backwards;Forwards;With walker Number of Stairs: 3 General stair comments: single step with RW - once fwd and twice bkwd with spouse assisting on 2nd attempt  Wheelchair Mobility    Modified Rankin (Stroke Patients Only)       Balance                                              Pertinent Vitals/Pain Pain Assessment: 0-10 Pain Score: 5  Pain Location: R knee Pain Descriptors / Indicators: Aching;Sore Pain Intervention(s): Limited activity within patient's tolerance;Monitored during session;Premedicated before session;Ice applied    Home Living Family/patient expects to be discharged to:: Private residence Living Arrangements: Spouse/significant other Available Help at Discharge: Family Type of Home: House Home Access: Stairs to enter Entrance Stairs-Rails: None Entrance Stairs-Number of Steps: 1+1 Home Layout: One level Home Equipment: Environmental consultant - 2 wheels;Cane - single point;Bedside commode      Prior Function Level of Independence: Independent               Hand Dominance        Extremity/Trunk Assessment   Upper Extremity Assessment Upper Extremity Assessment: Overall WFL for tasks assessed    Lower Extremity Assessment Lower Extremity Assessment: LLE deficits/detail;RLE deficits/detail RLE Deficits / Details: 3/5 quads with IND SLR.  AAROM at knee -12 - 75 LLE Deficits / Details: Strength WFL with AROM -10 - ~90    Cervical / Trunk Assessment Cervical / Trunk Assessment: Kyphotic  Communication   Communication: No difficulties  Cognition Arousal/Alertness: Awake/alert Behavior During Therapy: WFL for tasks assessed/performed Overall Cognitive Status: Within Functional Limits for tasks assessed                      General Comments      Exercises     Assessment/Plan    PT  Assessment Patient needs continued PT services  PT Problem List Decreased strength;Decreased range of motion;Decreased activity tolerance;Decreased balance;Decreased mobility;Decreased knowledge of use of DME;Pain          PT Treatment Interventions DME instruction;Gait training;Stair training;Functional mobility training;Therapeutic activities;Therapeutic exercise;Patient/family  education    PT Goals (Current goals can be found in the Care Plan section)  Acute Rehab PT Goals Patient Stated Goal: Recover with increased ROM and use of knee PT Goal Formulation: With patient Time For Goal Achievement: 07/19/16 Potential to Achieve Goals: Good    Frequency 7X/week   Barriers to discharge        Co-evaluation               End of Session            Functional Assessment Tool Used: clinical judgement Functional Limitation: Mobility: Walking and moving around Mobility: Walking and Moving Around Current Status [id number]): At least 20 percent but less than 40 percent impaired, limited or restricted Mobility: Walking and Moving Around Goal Status [phone]): At least 1 percent but less than 20 percent impaired, limited or restricted    Time: 1440-1512 PT Time Calculation (min) (ACUTE ONLY): 32 min   Charges:   PT Evaluation $PT Eval Low Complexity: 1 Procedure PT Treatments $Gait Training: 8-22 mins   PT G Codes:   PT G-Codes **NOT FOR INPATIENT CLASS** Functional Assessment Tool Used: clinical judgement Functional Limitation: Mobility: Walking and moving around Mobility: Walking and Moving Around Current Status [id number]): At least 20 percent but less than 40 percent impaired, limited or restricted Mobility: Walking and Moving Around Goal Status [phone]): At least 1 percent but less than 20 percent impaired, limited or restricted    [facility] 07/18/2016, 5:55 PM

## 2016-07-18 NOTE — Discharge Instructions (Signed)
° °Dr. Frank Aluisio °Total Joint Specialist °[facility] °[address] °[place], [place] [address] °[phone] ° °TOTAL KNEE REPLACEMENT POSTOPERATIVE DIRECTIONS ° °Knee Rehabilitation, Guidelines Following Surgery  °Results after knee surgery are often greatly improved when you follow the exercise, range of motion and muscle strengthening exercises prescribed by your doctor. Safety measures are also important to protect the knee from further injury. Any time any of these exercises cause you to have increased pain or swelling in your knee joint, decrease the amount until you are comfortable again and slowly increase them. If you have problems or questions, call your caregiver or physical therapist for advice.  ° °HOME CARE INSTRUCTIONS  °Remove items at home which could result in a fall. This includes throw rugs or furniture in walking pathways.  °· ICE to the affected knee every three hours for 30 minutes at a time and then as needed for pain and swelling.  Continue to use ice on the knee for pain and swelling from surgery. You may notice swelling that will progress down to the foot and ankle.  This is normal after surgery.  Elevate the leg when you are not up walking on it.   °· Continue to use the breathing machine which will help keep your temperature down.  It is common for your temperature to cycle up and down following surgery, especially at night when you are not up moving around and exerting yourself.  The breathing machine keeps your lungs expanded and your temperature down. °· Do not place pillow under knee, focus on keeping the knee straight while resting ° °DIET °You may resume your previous home diet once your are discharged from the hospital. ° °DRESSING / WOUND CARE / SHOWERING °You may shower 3 days after surgery, but keep the wounds dry during showering.  You may use an occlusive plastic wrap (Press'n Seal for example), NO SOAKING/SUBMERGING IN THE BATHTUB.  If the  bandage gets wet, change with a clean dry gauze.  If the incision gets wet, pat the wound dry with a clean towel. °You may start showering once you are discharged home but do not submerge the incision under water. Just pat the incision dry and apply a dry gauze dressing on daily. °Change the surgical dressing daily and reapply a dry dressing each time. ° °ACTIVITY °Walk with your walker as instructed. °Use walker as long as suggested by your caregivers. °Avoid periods of inactivity such as sitting longer than an hour when not asleep. This helps prevent blood clots.  °You may resume a sexual relationship in one month or when given the OK by your doctor.  °You may return to work once you are cleared by your doctor.  °Do not drive a car for 6 weeks or until released by you surgeon.  °Do not drive while taking narcotics. ° °WEIGHT BEARING °Weight bearing as tolerated with assist device (walker, cane, etc) as directed, use it as long as suggested by your surgeon or therapist, typically at least 4-6 weeks. ° °POSTOPERATIVE CONSTIPATION PROTOCOL °Constipation - defined medically as fewer than three stools per week and severe constipation as less than one stool per week. ° °One of the most common issues patients have following surgery is constipation.  Even if you have a regular bowel pattern at home, your normal regimen is likely to be disrupted due to multiple reasons following surgery.  Combination of anesthesia, postoperative narcotics, change in appetite and fluid intake all can affect your bowels.    In order to avoid complications following surgery, here are some recommendations in order to help you during your recovery period. ° °Colace (docusate) - Pick up an over-the-counter form of Colace or another stool softener and take twice a day as long as you are requiring postoperative pain medications.  Take with a full glass of water daily.  If you experience loose stools or diarrhea, hold the colace until you stool forms  back up.  If your symptoms do not get better within 1 week or if they get worse, check with your doctor. ° °Dulcolax (bisacodyl) - Pick up over-the-counter and take as directed by the product packaging as needed to assist with the movement of your bowels.  Take with a full glass of water.  Use this product as needed if not relieved by Colace only.  ° °MiraLax (polyethylene glycol) - Pick up over-the-counter to have on hand.  MiraLax is a solution that will increase the amount of water in your bowels to assist with bowel movements.  Take as directed and can mix with a glass of water, juice, soda, coffee, or tea.  Take if you go more than two days without a movement. °Do not use MiraLax more than once per day. Call your doctor if you are still constipated or irregular after using this medication for 7 days in a row. ° °If you continue to have problems with postoperative constipation, please contact the office for further assistance and recommendations.  If you experience "the worst abdominal pain ever" or develop nausea or vomiting, please contact the office immediatly for further recommendations for treatment. ° °ITCHING ° If you experience itching with your medications, try taking only a single pain pill, or even half a pain pill at a time.  You can also use Benadryl over the counter for itching or also to help with sleep.  ° °TED HOSE STOCKINGS °Wear the elastic stockings on both legs for three weeks following surgery during the day but you may remove then at night for sleeping. ° °MEDICATIONS °See your medication summary on the “After Visit Summary” that the nursing staff will review with you prior to discharge.  You may have some home medications which will be placed on hold until you complete the course of blood thinner medication.  It is important for you to complete the blood thinner medication as prescribed by your surgeon.  Continue your approved medications as instructed at time of  discharge. ° °PRECAUTIONS °If you experience chest pain or shortness of breath - call 911 immediately for transfer to the hospital emergency department.  °If you develop a fever greater that 101 F, purulent drainage from wound, increased redness or drainage from wound, foul odor from the wound/dressing, or calf pain - CONTACT YOUR SURGEON.   °                                                °FOLLOW-UP APPOINTMENTS °Make sure you keep all of your appointments after your operation with your surgeon and caregivers. You should call the office at the above phone number and make an appointment for approximately two weeks after the date of your surgery or on the date instructed by your surgeon outlined in the "After Visit Summary". ° ° °RANGE OF MOTION AND STRENGTHENING EXERCISES  °Rehabilitation of the knee is important following a knee injury or   an operation. After just a few days of immobilization, the muscles of the thigh which control the knee become weakened and shrink (atrophy). Knee exercises are designed to build up the tone and strength of the thigh muscles and to improve knee motion. Often times heat used for twenty to thirty minutes before working out will loosen up your tissues and help with improving the range of motion but do not use heat for the first two weeks following surgery. These exercises can be done on a training (exercise) mat, on the floor, on a table or on a bed. Use what ever works the best and is most comfortable for you Knee exercises include:  Leg Lifts - While your knee is still immobilized in a splint or cast, you can do straight leg raises. Lift the leg to 60 degrees, hold for 3 sec, and slowly lower the leg. Repeat 10-20 times 2-3 times daily. Perform this exercise against resistance later as your knee gets better.  Quad and Hamstring Sets - Tighten up the muscle on the front of the thigh (Quad) and hold for 5-10 sec. Repeat this 10-20 times hourly. Hamstring sets are done by pushing the  foot backward against an object and holding for 5-10 sec. Repeat as with quad sets.   Leg Slides: Lying on your back, slowly slide your foot toward your buttocks, bending your knee up off the floor (only go as far as is comfortable). Then slowly slide your foot back down until your leg is flat on the floor again.  Angel Wings: Lying on your back spread your legs to the side as far apart as you can without causing discomfort.  A rehabilitation program following serious knee injuries can speed recovery and prevent re-injury in the future due to weakened muscles. Contact your doctor or a physical therapist for more information on knee rehabilitation.   IF YOU ARE TRANSFERRED TO A SKILLED REHAB FACILITY If the patient is transferred to a skilled rehab facility following release from the hospital, a list of the current medications will be sent to the facility for the patient to continue.  When discharged from the skilled rehab facility, please have the facility set up the patient's [place] prior to being released. Also, the skilled facility will be responsible for providing the patient with their medications at time of release from the facility to include their pain medication, the muscle relaxants, and their blood thinner medication. If the patient is still at the rehab facility at time of the two week follow up appointment, the skilled rehab facility will also need to assist the patient in arranging follow up appointment in our office and any transportation needs.  MAKE SURE YOU:  Understand these instructions.  Get help right away if you are not doing well or get worse.    Pick up stool softner and laxative for home use following surgery while on pain medications. Do not submerge incision under water. Please use good hand washing techniques while changing dressing each day. May shower starting three days after surgery. Please use a clean towel to pat the incision dry following  showers. Continue to use ice for pain and swelling after surgery. Do not use any lotions or creams on the incision until instructed by your surgeon.  Continuous passive motion machine (CPM):      Use the CPM from Zero Degrees to 60 Degrees for 6 hours per day.      You may increase by 10 Degrees per  day.  Break it up into 3 sessions per day, two hours per session.      Use CPM until you follow up in office.  Take a full dose Aspirin 325 mg Daily for three weeks.

## 2016-07-18 NOTE — Care Management Note (Addendum)
Case Management Note  Patient Details  Name: Jenna Howe MRN: [id number] Date of Birth: March 28, 1941  Subjective/Objective:                  right knee arthrofibrosis Action/Plan: Discharge planning  Expected Discharge Date:  07/18/16               Expected Discharge Plan:  Home/Self Care  In-House Referral:     Discharge planning Services  CM Consult  Post Acute Care Choice:  Durable Medical Equipment Choice offered to:  Patient  DME Arranged:  CPM DME Agency:  Other - Comment  HH Arranged:  NA HH Agency:  NA  Status of Service:  Completed, signed off  If discussed at [place] of Stay Meetings, dates discussed:    Additional Comments: 15:00 CM called Jenna Howe to notify him pt will probably not discharge until 16:00ish and pt was worried CPM would not be delivered.  Jenna Howe assured CM pt discharge is being monitored and this will not be a problem.  Jenna Howe has spoken with pt.  CM relayed message to charge, RN Jenna Howe.  No other CM needs were communicated.  PA notified Cm to please arrange for CPM.  CM faxed information to [facility] who states he received all necessary information and is arranging CPM with pt.  No other CM needs were communicated. Dellie Catholic, RN 07/18/2016, 12:30 PM

## 2016-07-18 NOTE — Progress Notes (Signed)
Physical Therapy Treatment Patient Details Name: Jenna Howe MRN: [id number] DOB: [birth year] Today's Date: 07/18/2016    History of Present Illness Pt s/p R knee scar tissue excision and knee manipulation    PT Comments    Reviewed therex with pt and spouse with spouse assisting with hands on.  Provided home therex program for pt to follow dependent on when OP PT can be arranged.  Reviewed with pt and spouse need for knee ext when resting.  Pt's spouse very focussed on assisting wife and with many questions asked and answered.  Follow Up Recommendations  Outpatient PT     Equipment Recommendations  None recommended by PT    Recommendations for Other Services       Precautions / Restrictions Precautions Precautions: Knee;Fall Restrictions Weight Bearing Restrictions: No Other Position/Activity Restrictions: WBAT    Mobility  Bed Mobility Overal bed mobility: Modified Independent             General bed mobility comments: Pt to EOB unassisted  Transfers Overall transfer level: Needs assistance Equipment used: Rolling walker (2 wheeled) Transfers: Sit to/from Stand Sit to Stand: Min guard         General transfer comment: cues for LE management and use of UEs to self assist  Ambulation/Gait Ambulation/Gait assistance: Min guard;Supervision Ambulation Distance (Feet): 123 Feet Assistive device: Rolling walker (2 wheeled) Gait Pattern/deviations: Step-to pattern;Step-through pattern;Decreased step length - right;Decreased step length - left;Shuffle;Trunk flexed Gait velocity: decr Gait velocity interpretation: Below normal speed for age/gender General Gait Details: cues for posture, position from RW and initial sequence   Stairs Stairs: Yes   Stair Management: No rails;Step to pattern;Backwards;Forwards;With walker Number of Stairs: 3 General stair comments: single step with RW - once fwd and twice bkwd with spouse assisting on 2nd  attempt  Wheelchair Mobility    Modified Rankin (Stroke Patients Only)       Balance                                    Cognition Arousal/Alertness: Awake/alert Behavior During Therapy: WFL for tasks assessed/performed Overall Cognitive Status: Within Functional Limits for tasks assessed                      Exercises Total Joint Exercises Ankle Circles/Pumps: AROM;Both;20 reps;Supine Quad Sets: AROM;Both;15 reps;Supine Heel Slides: AAROM;Right;Other reps (comment);Supine (40 reps c PT assist, self assist c sheet, and assist of spou) Straight Leg Raises: AROM;AAROM;Right;20 reps;Supine    General Comments        Pertinent Vitals/Pain Pain Assessment: 0-10 Pain Score: 5  Pain Location: R knee Pain Descriptors / Indicators: Aching;Sore Pain Intervention(s): Limited activity within patient's tolerance;Monitored during session;Premedicated before session;Ice applied    Home Living Family/patient expects to be discharged to:: Private residence Living Arrangements: Spouse/significant other Available Help at Discharge: Family Type of Home: House Home Access: Stairs to enter Entrance Stairs-Rails: None Home Layout: One level Home Equipment: Environmental consultant - 2 wheels;Cane - single point;Bedside commode      Prior Function Level of Independence: Independent          PT Goals (current goals can now be found in the care plan section) Acute Rehab PT Goals Patient Stated Goal: Recover with increased ROM and use of knee PT Goal Formulation: With patient Time For Goal Achievement: 07/19/16 Potential to Achieve Goals: Good Progress towards PT goals: Progressing toward goals  Frequency    7X/week      PT Plan Current plan remains appropriate    Co-evaluation             End of Session   Activity Tolerance: Patient tolerated treatment well;Patient limited by pain Patient left: in chair;with call bell/phone within reach;with family/visitor  present     Time: 1530-1557 PT Time Calculation (min) (ACUTE ONLY): 27 min  Charges:  $Gait Training: 8-22 mins $Therapeutic Exercise: 23-37 mins                    G Codes:  Functional Assessment Tool Used: clinical judgement Functional Limitation: Mobility: Walking and moving around Mobility: Walking and Moving Around Current Status [phone]): At least 20 percent but less than 40 percent impaired, limited or restricted Mobility: Walking and Moving Around Goal Status [phone]): At least 1 percent but less than 20 percent impaired, limited or restricted   Marquist Binstock 07/18/2016, 6:00 PM

## 2016-07-18 NOTE — Discharge Summary (Signed)
Physician Discharge Summary   Patient ID: Jenna Howe MRN: [id number] DOB/AGE: Jun 30, 1941 76 y.o.  Admit date: 07/17/2016 Discharge date: 07-18-2016  Primary Diagnosis:  right knee arthrofibrosis  Admission Diagnoses:  Past Medical History:  Diagnosis Date  . Anemia   . Arthritis    arthritis in knees and hands . Right wrist and thumb splinted due to pain.  . Blood transfusion    10/12 after knee surgery   . Blood transfusion without reported diagnosis   . Essential tremor   . Glaucoma   . H/O seasonal allergies    sinus congestion  . Hx of adenomatous polyp of colon 08/18/2014  . Hypertension   . Tremor of both hands    mild , tx. Primidone   Discharge Diagnoses:   Principal Problem:   Arthrofibrosis of total knee arthroplasty (HCC) Active Problems:   Arthrofibrosis of knee joint  Estimated body mass index is 26.26 kg/m as calculated from the following:   Height as of this encounter: '5\' 4"'  (1.626 m).   Weight as of this encounter: 69.4 kg (153 lb).  Procedure:  Procedure(s) (LRB): KNEE ARTHROTOMY with scar excision (Right)   Consults: None  HPI: Patient presents with stiffness involving the  right knee. Onset of the symptoms was several years ago. Inciting event: She has had revision total knee arthroplasty and has had persistent stiffness which has worsened over time.She has pain associated with the stiffness.She is having discomfort in her hip and back due to altered gait. She desires more range of motion and presents now for arthrotomy and scar excision.  Laboratory Data: Admission on 07/17/2016  Component Date Value Ref Range Status  . WBC 07/17/2016 8.6  4.0 - 10.5 K/uL Final  . RBC 07/17/2016 4.74  3.87 - 5.11 MIL/uL Final  . Hemoglobin 07/17/2016 8.5* 12.0 - 15.0 g/dL Final  . HCT 07/17/2016 33.8* 36.0 - 46.0 % Final  . MCV 07/17/2016 71.3* 78.0 - 100.0 fL Final  . MCH 07/17/2016 17.9* 26.0 - 34.0 pg Final  . MCHC 07/17/2016 25.1* 30.0 - 36.0 g/dL  Final  . RDW 07/17/2016 16.3* 11.5 - 15.5 % Final  . Platelets 07/17/2016 237  150 - 400 K/uL Final  . Creatinine, Ser 07/17/2016 0.98  0.44 - 1.00 mg/dL Final  . GFR calc non Af Amer 07/17/2016 55* >60 mL/min Final  . GFR calc Af Amer 07/17/2016 >60  >60 mL/min Final   Comment: (NOTE) The eGFR has been calculated using the CKD EPI equation. This calculation has not been validated in all clinical situations. eGFR's persistently <60 mL/min signify possible Chronic Kidney Disease.   Hospital Outpatient Visit on 07/11/2016  Component Date Value Ref Range Status  . Sodium 07/11/2016 141  135 - 145 mmol/L Final  . Potassium 07/11/2016 3.7  3.5 - 5.1 mmol/L Final  . Chloride 07/11/2016 101  101 - 111 mmol/L Final  . CO2 07/11/2016 32  22 - 32 mmol/L Final  . Glucose, Bld 07/11/2016 116* 65 - 99 mg/dL Final  . BUN 07/11/2016 15  6 - 20 mg/dL Final  . Creatinine, Ser 07/11/2016 1.06* 0.44 - 1.00 mg/dL Final  . Calcium 07/11/2016 8.8* 8.9 - 10.3 mg/dL Final  . GFR calc non Af Amer 07/11/2016 50* >60 mL/min Final  . GFR calc Af Amer 07/11/2016 58* >60 mL/min Final   Comment: (NOTE) The eGFR has been calculated using the CKD EPI equation. This calculation has not been validated in all clinical situations. eGFR's persistently <60  mL/min signify possible Chronic Kidney Disease.   . Anion gap 07/11/2016 8  5 - 15 Final  . WBC 07/11/2016 9.5  4.0 - 10.5 K/uL Final  . RBC 07/11/2016 4.89  3.87 - 5.11 MIL/uL Final  . Hemoglobin 07/11/2016 10.9* 12.0 - 15.0 g/dL Final  . HCT 07/11/2016 33.9* 36.0 - 46.0 % Final  . MCV 07/11/2016 69.3* 78.0 - 100.0 fL Final  . MCH 07/11/2016 22.3* 26.0 - 34.0 pg Final  . MCHC 07/11/2016 32.2  30.0 - 36.0 g/dL Final  . RDW 07/11/2016 15.9* 11.5 - 15.5 % Final  . Platelets 07/11/2016 341  150 - 400 K/uL Final     X-Rays:No results found.  EKG: Orders placed or performed during the hospital encounter of 07/11/16  . EKG 12 lead  . EKG 12 lead     Hospital  Course: Jenna Howe is a 76 y.o. who was admitted to [facility]. They were brought to the operating room on 07/17/2016 and underwent Procedure(s): KNEE ARTHROTOMY with scar excision.  Patient tolerated the procedure well and was later transferred to the recovery room and then to the orthopaedic floor for postoperative care.  They were given PO and IV analgesics for pain control following their surgery.  They were given 24 hours of postoperative antibiotics of  Anti-infectives    Start     Dose/Rate Route Frequency Ordered Stop   07/18/16 0600  ceFAZolin (ANCEF) IVPB 2g/100 mL premix     2 g 200 mL/hr over 30 Minutes Intravenous On call to O.R. 07/17/16 1302 07/17/16 1445   07/17/16 2100  vancomycin (VANCOCIN) IVPB 1000 mg/200 mL premix     1,000 mg 200 mL/hr over 60 Minutes Intravenous Every 12 hours 07/17/16 1845 07/17/16 2123     and started on DVT prophylaxis in the form of Lovenox.   PT ordered for postop therapy protocol.  Discharge planning consulted to help with postop disposition and equipment needs.  Patient had a good night on the evening of surgery.  They started to get up OOB with therapy on day one.  Patient was seen in rounds on day one and it was felt that as long as they did well with the remaining sessions of therapy that they would be ready to go home.  Arrangements were made and they were setup to go home on POD 1.  Discharge home Diet - Cardiac diet Follow up - in 2 weeks Activity - WBAT Disposition - Home Condition Upon Discharge - Good D/C Meds - See DC Summary DVT Prophylaxis - Aspirin 325 mg daily for three weeks.  Discharge Instructions    CPM    Complete by:  As directed    Continuous passive motion machine (CPM):      Use the CPM from Zero Degrees to 60 Degrees for 6 hours per day.      You may increase by 10 Degrees per day.  Break it up into 3 sessions per day, two hours per session.      Use CPM until you follow up in office.   Call MD / Call  911    Complete by:  As directed    If you experience chest pain or shortness of breath, CALL 911 and be transported to the hospital emergency room.  If you develope a fever above 101 F, pus (white drainage) or increased drainage or redness at the wound, or calf pain, call your surgeon's office.   Change dressing  Complete by:  As directed    Change dressing daily with sterile 4 x 4 inch gauze dressing and apply TED hose. Do not submerge the incision under water.   Constipation Prevention    Complete by:  As directed    Drink plenty of fluids.  Prune juice may be helpful.  You may use a stool softener, such as Colace (over the counter) 100 mg twice a day.  Use MiraLax (over the counter) for constipation as needed.   Diet - low sodium heart healthy    Complete by:  As directed    Discharge instructions    Complete by:  As directed    Pick up stool softner and laxative for home use following surgery while on pain medications. Do not submerge incision under water. Please use good hand washing techniques while changing dressing each day. Start changing dressing on post op day two - Friday 07/19/2016 May shower starting three days after surgery. Please use a clean towel to pat the incision dry following showers. Continue to use ice for pain and swelling after surgery. Do not use any lotions or creams on the incision until instructed by your surgeon.  Wear both TED hose on both legs during the day every day for three weeks, but may have off at night at home.  Postoperative Constipation Protocol  Constipation - defined medically as fewer than three stools per week and severe constipation as less than one stool per week.  One of the most common issues patients have following surgery is constipation.  Even if you have a regular bowel pattern at home, your normal regimen is likely to be disrupted due to multiple reasons following surgery.  Combination of anesthesia, postoperative narcotics, change  in appetite and fluid intake all can affect your bowels.  In order to avoid complications following surgery, here are some recommendations in order to help you during your recovery period.  Colace (docusate) - Pick up an over-the-counter form of Colace or another stool softener and take twice a day as long as you are requiring postoperative pain medications.  Take with a full glass of water daily.  If you experience loose stools or diarrhea, hold the colace until you stool forms back up.  If your symptoms do not get better within 1 week or if they get worse, check with your doctor.  Dulcolax (bisacodyl) - Pick up over-the-counter and take as directed by the product packaging as needed to assist with the movement of your bowels.  Take with a full glass of water.  Use this product as needed if not relieved by Colace only.   MiraLax (polyethylene glycol) - Pick up over-the-counter to have on hand.  MiraLax is a solution that will increase the amount of water in your bowels to assist with bowel movements.  Take as directed and can mix with a glass of water, juice, soda, coffee, or tea.  Take if you go more than two days without a movement. Do not use MiraLax more than once per day. Call your doctor if you are still constipated or irregular after using this medication for 7 days in a row.  If you continue to have problems with postoperative constipation, please contact the office for further assistance and recommendations.  If you experience "the worst abdominal pain ever" or develop nausea or vomiting, please contact the office immediatly for further recommendations for treatment.   Take a full dose Aspirin 325 mg daily for three weeks.  Continuous passive motion  machine (CPM):      Use the CPM from Zero Degrees to 60 Degrees for 6 hours per day.      You may increase by 10 Degrees per day.  Break it up into 3 sessions per day, 2 hours per session.      Use CPM until you follow up in office.    Do not  put a pillow under the knee. Place it under the heel.    Complete by:  As directed    Do not sit on low chairs, stoools or toilet seats, as it may be difficult to get up from low surfaces    Complete by:  As directed    Driving restrictions    Complete by:  As directed    No driving until released by the physician.   Increase activity slowly as tolerated    Complete by:  As directed    Lifting restrictions    Complete by:  As directed    No lifting until released by the physician.   Patient may shower    Complete by:  As directed    You may shower without a dressing once there is no drainage.  Do not wash over the wound.  If drainage remains, do not shower until drainage stops.   TED hose    Complete by:  As directed    Use stockings (TED hose) for 3 weeks on both leg(s).  You may remove them at night for sleeping.   Weight bearing as tolerated    Complete by:  As directed    Laterality:  right   Extremity:  Lower     Allergies as of 07/18/2016      Reactions   Clinoril [sulindac]    Stomach upset   Celebrex [celecoxib] Rash   Stomach upset   Penicillins Rash   Has patient had a PCN reaction causing immediate rash, facial/tongue/throat swelling, SOB or lightheadedness with hypotension:Yes Has patient had a PCN reaction causing severe rash involving mucus membranes or skin necrosis: No Has patient had a PCN reaction that required hospitalization No Has patient had a PCN reaction occurring within the last 10 years: No If all of the above answers are "NO", then may proceed with Cephalosporin use.   Sulfa Antibiotics Rash      Medication List    STOP taking these medications   oxyCODONE-acetaminophen 5-325 MG tablet Commonly known as:  PERCOCET/ROXICET     TAKE these medications   amLODipine-valsartan 10-320 MG tablet Commonly known as:  EXFORGE Take 1 tablet by mouth daily.   BIOTIN 5000 PO Take 5,000 mcg by mouth daily.   bumetanide 1 MG tablet Commonly known as:   BUMEX Take 1 mg by mouth daily.   CITRACAL + D PO Take 1 tablet by mouth 2 (two) times daily.   clindamycin 150 MG capsule Commonly known as:  CLEOCIN Take 4 capsules orally prior to dental procedure.   ferrous gluconate 324 MG tablet Commonly known as:  FERGON Take 324 mg by mouth daily.   fish oil-omega-3 fatty acids 1000 MG capsule Take 1 g by mouth 2 (two) times daily.   latanoprost 0.005 % ophthalmic solution Commonly known as:  XALATAN Place 1 drop into both eyes at bedtime.   loratadine 10 MG tablet Commonly known as:  CLARITIN Take 10 mg by mouth daily.   methocarbamol 500 MG tablet Commonly known as:  ROBAXIN Take 1 tablet (500 mg total) by mouth every 6 (six)  hours as needed for muscle spasms. What changed:  when to take this   multivitamin tablet Take 1 tablet by mouth daily.   oxyCODONE 5 MG immediate release tablet Commonly known as:  Oxy IR/ROXICODONE Take 1-2 tablets (5-10 mg total) by mouth every 4 (four) hours as needed for severe pain.   primidone 50 MG tablet Commonly known as:  MYSOLINE TAKE 1/2 A TABLET TWICE A DAY   simvastatin 10 MG tablet Commonly known as:  ZOCOR Take 10 mg by mouth every evening.   timolol 0.5 % ophthalmic solution Commonly known as:  TIMOPTIC Place 1 drop into both eyes daily.   traMADol 50 MG tablet Commonly known as:  ULTRAM Take 1 tablet (50 mg total) by mouth every 6 (six) hours as needed for moderate pain. What changed:  when to take this  reasons to take this   vitamin C 500 MG tablet Commonly known as:  ASCORBIC ACID Take 500 mg by mouth daily.   Vitamin D3 1000 units Caps Take 1,000 Units by mouth daily.            Durable Medical Equipment        Start     Ordered   07/18/16 0752  For home use only DME Continuous passive motion machine  Once    Comments:  HOME CPM Continuous passive motion machine (CPM):      Use the CPM from Zero Degrees to 60 Degrees for 6 hours per day.      You may  increase by 10 Degrees per day.  Break it up into 3 sessions per day, two hours per session.      Use CPM until you follow up in office.   07/18/16 0753     Follow-up Information    Gearlean Alf, MD. Schedule an appointment as soon as possible for a visit on 07/30/2016.   Specialty:  Orthopedic Surgery Contact information: [address] [place] [address] [phone]           Signed: Arlee Muslim, PA-C Orthopaedic Surgery 07/18/2016, 8:03 AM

## 2016-07-18 NOTE — Op Note (Signed)
NAMEHESSIE, Jenna Howe NO.:  [id number]  MEDICAL RECORD NO.:  [id number]  LOCATION:  [facility]                         FACILITY:  [facility]  PHYSICIAN:  Gaynelle Arabian, M.D.    DATE OF BIRTH:  1941-02-28  DATE OF PROCEDURE:  07/17/2016 DATE OF DISCHARGE:                              OPERATIVE REPORT   PREOPERATIVE DIAGNOSIS:  Arthrofibrosis, right knee.  POSTOPERATIVE DIAGNOSIS:  Arthrofibrosis, right knee.  PROCEDURE:  Right knee arthrotomy and scar excision.  SURGEON:  Gaynelle Arabian, M.D.  ASSISTANT:  Nehemiah Massed, [facility].  ANESTHESIA:  Spinal and adductor canal block.  ESTIMATED BLOOD LOSS:  Minimal.  DRAIN:  Hemovac x1.  TOURNIQUET TIME:  20 minutes at 300 mmHg.  COMPLICATIONS:  None.  CONDITION:  Stable to recovery.  BRIEF CLINICAL NOTE:  Ms. Teodosio is a 77 year old female, who had a total knee arthroplasty revision performed several years ago.  She has had significant stiffening and scar tissue formation in this knee.  It has got worse with decrease in range of motion recently.  She is now developing pain in her hip and lower back.  She desires more range of motion and we discussed possibly doing arthrotomy and scar excision to help facilitate that.  PROCEDURE IN DETAIL:  After successful administration of adductor canal block and spinal anesthetic, a tourniquet was placed high on her right thigh and her right lower extremity prepped and draped in the usual sterile fashion.  Extremity was wrapped in Esmarch, tourniquet inflated to 300 mmHg.  Her range of motion is flexion to about 90.  She lacked about 12 degrees from full extension.  Fresh blade was used to make a longitudinal incision through the skin to subcutaneous tissue. Subcutaneous tissue was also scarred down and subcutaneous flaps were created circumferentially around the knee.  I then used a fresh blade to make a medial arthrotomy.  We did not encounter any fluid in the joint. The exuberant  scar is present throughout the joint and this was excised with a fresh blade.  There was small amount of heterotopic bone on the anterior cortex of the femur which was removed.  I palpated all around the distal femur looking for a K-wire that was transfemoral, but I could not find it as it was probably fairly far posterior.  All other scar tissue that is identified is excised through the joint.  I then was able to get full extension with some gentle pressure and get her flexed between 150 and 120 degrees.  The wound was thoroughly irrigated with saline solution and the arthrotomy was closed over Hemovac drain with a running #1 V-Loc suture.  I closed her in 90 degrees of flexion.  I was able to get to about 110 after closure. Tourniquet was released total time of 20 minutes.  Minor bleeding stopped with cautery.  Subcu was then closed with interrupted 2-0 Vicryl.  Flexion against gravity is still about 105-110 degrees.  I had her within 5 degrees of full extension.  We placed a Hemovac drain subcu prior to closing with 2-0 Vicryl. Monocryl was used to close the subcuticular layer.  Incisions cleaned and dried and Steri-Strips and a bulky sterile  dressing applied. The drains were hooked to suction and she was awakened and transported to recovery in stable condition.  Note that a surgical assistant was a medical necessity for this procedure for retraction of ligaments and neurovascular structures while dissecting through this thick scar and also essential for closure.     Gaynelle Arabian, M.D.     FA/MEDQ  D:  07/17/2016  T:  07/18/2016  Job:  [id number]

## 2016-07-19 ENCOUNTER — Encounter (HOSPITAL_COMMUNITY): Payer: Self-pay | Admitting: Orthopedic Surgery

## 2016-07-19 NOTE — Addendum Note (Signed)
Addendum  created 07/19/16 1143 by Nolon Nations, MD   Sign clinical note, SmartForm saved

## 2016-07-25 DIAGNOSIS — M25661 Stiffness of right knee, not elsewhere classified: Secondary | ICD-10-CM | POA: Diagnosis not present

## 2016-07-30 DIAGNOSIS — M25661 Stiffness of right knee, not elsewhere classified: Secondary | ICD-10-CM | POA: Diagnosis not present

## 2016-08-01 DIAGNOSIS — M25661 Stiffness of right knee, not elsewhere classified: Secondary | ICD-10-CM | POA: Diagnosis not present

## 2016-08-06 DIAGNOSIS — M25661 Stiffness of right knee, not elsewhere classified: Secondary | ICD-10-CM | POA: Diagnosis not present

## 2016-08-08 DIAGNOSIS — M25661 Stiffness of right knee, not elsewhere classified: Secondary | ICD-10-CM | POA: Diagnosis not present

## 2016-08-13 DIAGNOSIS — M25661 Stiffness of right knee, not elsewhere classified: Secondary | ICD-10-CM | POA: Diagnosis not present

## 2016-08-16 DIAGNOSIS — M25661 Stiffness of right knee, not elsewhere classified: Secondary | ICD-10-CM | POA: Diagnosis not present

## 2016-08-19 DIAGNOSIS — G250 Essential tremor: Secondary | ICD-10-CM | POA: Diagnosis not present

## 2016-08-19 DIAGNOSIS — R7303 Prediabetes: Secondary | ICD-10-CM | POA: Diagnosis not present

## 2016-08-19 DIAGNOSIS — D509 Iron deficiency anemia, unspecified: Secondary | ICD-10-CM | POA: Diagnosis not present

## 2016-08-19 DIAGNOSIS — I10 Essential (primary) hypertension: Secondary | ICD-10-CM | POA: Diagnosis not present

## 2016-08-19 DIAGNOSIS — N183 Chronic kidney disease, stage 3 (moderate): Secondary | ICD-10-CM | POA: Diagnosis not present

## 2016-08-20 DIAGNOSIS — M25661 Stiffness of right knee, not elsewhere classified: Secondary | ICD-10-CM | POA: Diagnosis not present

## 2016-08-22 DIAGNOSIS — M25661 Stiffness of right knee, not elsewhere classified: Secondary | ICD-10-CM | POA: Diagnosis not present

## 2016-09-19 DIAGNOSIS — H2513 Age-related nuclear cataract, bilateral: Secondary | ICD-10-CM | POA: Diagnosis not present

## 2016-09-19 DIAGNOSIS — H52203 Unspecified astigmatism, bilateral: Secondary | ICD-10-CM | POA: Diagnosis not present

## 2016-09-19 DIAGNOSIS — H401132 Primary open-angle glaucoma, bilateral, moderate stage: Secondary | ICD-10-CM | POA: Diagnosis not present

## 2016-10-28 ENCOUNTER — Other Ambulatory Visit: Payer: Self-pay | Admitting: Internal Medicine

## 2016-10-28 DIAGNOSIS — Z1231 Encounter for screening mammogram for malignant neoplasm of breast: Secondary | ICD-10-CM

## 2016-11-27 ENCOUNTER — Ambulatory Visit
Admission: RE | Admit: 2016-11-27 | Discharge: 2016-11-27 | Disposition: A | Discharge status: Home / Self Care | Payer: Medicare PPO | Source: Ambulatory Visit | Attending: Internal Medicine | Admitting: Internal Medicine

## 2016-11-27 DIAGNOSIS — Z1231 Encounter for screening mammogram for malignant neoplasm of breast: Secondary | ICD-10-CM

## 2016-12-19 DIAGNOSIS — Z471 Aftercare following joint replacement surgery: Secondary | ICD-10-CM | POA: Diagnosis not present

## 2016-12-19 DIAGNOSIS — Z4789 Encounter for other orthopedic aftercare: Secondary | ICD-10-CM | POA: Diagnosis not present

## 2016-12-19 DIAGNOSIS — Z96653 Presence of artificial knee joint, bilateral: Secondary | ICD-10-CM | POA: Diagnosis not present

## 2016-12-19 DIAGNOSIS — M1711 Unilateral primary osteoarthritis, right knee: Secondary | ICD-10-CM | POA: Diagnosis not present

## 2017-02-04 DIAGNOSIS — Z0000 Encounter for general adult medical examination without abnormal findings: Secondary | ICD-10-CM | POA: Diagnosis not present

## 2017-02-04 DIAGNOSIS — G250 Essential tremor: Secondary | ICD-10-CM | POA: Diagnosis not present

## 2017-02-04 DIAGNOSIS — M5090 Cervical disc disorder, unspecified, unspecified cervical region: Secondary | ICD-10-CM | POA: Diagnosis not present

## 2017-02-04 DIAGNOSIS — E782 Mixed hyperlipidemia: Secondary | ICD-10-CM | POA: Diagnosis not present

## 2017-02-04 DIAGNOSIS — N183 Chronic kidney disease, stage 3 (moderate): Secondary | ICD-10-CM | POA: Diagnosis not present

## 2017-02-04 DIAGNOSIS — H409 Unspecified glaucoma: Secondary | ICD-10-CM | POA: Diagnosis not present

## 2017-02-04 DIAGNOSIS — D509 Iron deficiency anemia, unspecified: Secondary | ICD-10-CM | POA: Diagnosis not present

## 2017-02-04 DIAGNOSIS — M1990 Unspecified osteoarthritis, unspecified site: Secondary | ICD-10-CM | POA: Diagnosis not present

## 2017-02-04 DIAGNOSIS — I10 Essential (primary) hypertension: Secondary | ICD-10-CM | POA: Diagnosis not present

## 2017-02-04 DIAGNOSIS — Z1389 Encounter for screening for other disorder: Secondary | ICD-10-CM | POA: Diagnosis not present

## 2017-02-04 DIAGNOSIS — R7303 Prediabetes: Secondary | ICD-10-CM | POA: Diagnosis not present

## 2017-03-11 DIAGNOSIS — N951 Menopausal and female climacteric states: Secondary | ICD-10-CM | POA: Diagnosis not present

## 2017-03-11 DIAGNOSIS — Z01411 Encounter for gynecological examination (general) (routine) with abnormal findings: Secondary | ICD-10-CM | POA: Diagnosis not present

## 2017-03-17 DIAGNOSIS — M545 Low back pain: Secondary | ICD-10-CM | POA: Diagnosis not present

## 2017-03-17 DIAGNOSIS — M4126 Other idiopathic scoliosis, lumbar region: Secondary | ICD-10-CM | POA: Diagnosis not present

## 2017-03-17 DIAGNOSIS — M5137 Other intervertebral disc degeneration, lumbosacral region: Secondary | ICD-10-CM | POA: Diagnosis not present

## 2017-03-17 DIAGNOSIS — M5431 Sciatica, right side: Secondary | ICD-10-CM | POA: Diagnosis not present

## 2017-03-19 ENCOUNTER — Other Ambulatory Visit: Payer: Self-pay | Admitting: Specialist

## 2017-03-19 DIAGNOSIS — M545 Low back pain, unspecified: Secondary | ICD-10-CM

## 2017-03-24 DIAGNOSIS — H401112 Primary open-angle glaucoma, right eye, moderate stage: Secondary | ICD-10-CM | POA: Diagnosis not present

## 2017-03-26 ENCOUNTER — Encounter: Payer: Self-pay | Admitting: Diagnostic Neuroimaging

## 2017-03-26 ENCOUNTER — Ambulatory Visit (INDEPENDENT_AMBULATORY_CARE_PROVIDER_SITE_OTHER): Payer: Medicare PPO | Admitting: Diagnostic Neuroimaging

## 2017-03-26 VITALS — BP 157/77 | HR 76 | Ht 64.0 in | Wt 150.0 lb

## 2017-03-26 DIAGNOSIS — G250 Essential tremor: Secondary | ICD-10-CM

## 2017-03-26 MED ORDER — PRIMIDONE 50 MG PO TABS: 25.0000 mg | ORAL_TABLET | Freq: Two times a day (BID) | ORAL | 4 refills | Status: DC

## 2017-03-26 NOTE — Progress Notes (Signed)
[facility]  PATIENT: Jenna Howe DOB: September 21, 1940  REFERRING CLINICIAN:  HISTORY FROM: patient  REASON FOR VISIT: follow up   HISTORICAL  CHIEF COMPLAINT:  Chief Complaint  Patient presents with  . Follow-up  . Tremors    doing well.    HISTORY OF PRESENT ILLNESS:   UPDATE (03/26/17, VRP): Since last visit, doing well. Tolerating primidone 25mg  twice a day. No alleviating or aggravating factors.   UPDATE (03/25/2016, MM): Jenna Howe is a 76 year old female with a history of essential tremor. She returns today for follow-up. She is currently taking primidone 25 mg twice a day. She reports that this controls her tremor quite well. She is able to eat and dress herself without any difficulty. She states that stress is a trigger for her tremor. She feels that her tremor has improved. She denies any new neurological symptoms. She returns today for an evaluation.  UPDATE (03/27/15, MM): Jenna Howe is a 76 year old female with a history of essential tremor. She returns today for follow-up. She is currently taking primidone 25 mg twice a day. She reports that her tremor is under good control. She states that if she is under a lot of stress or in a rush it'll become more prevalent. The tremor primarily affects the left hand. She denies any new neurological symptoms. She returns today for an evaluation  PRIOR HPI (01/23/11, VRP): 76 year old right-handed female with hypertension, history of cervical laminectomy and discectomy, arthritis, bilateral total knee replacements, here for evaluation of tremor since Jan 2012.The patient reports gradual, progressive tremor of bilateral upper extremities (left greater than right), mainly when she is doing specific tasks such as applying makeup, putting on earrings, using a spoon or drinking from a glass. She denies any tremor at rest. She denies any new smell or taste difficulties, sleep disturbances or walking problems. She denies any  swallowing difficulties. She has a brother with Parkinson's disease. No other family history of tremor   REVIEW OF SYSTEMS: Full 14 system review of systems performed and negative with exception of: joint pain walking diff.  ALLERGIES: Allergies  Allergen Reactions  . Clinoril [Sulindac]     Stomach upset  . Celebrex [Celecoxib] Rash    Stomach upset  . Penicillins Rash    Has patient had a PCN reaction causing immediate rash, facial/tongue/throat swelling, SOB or lightheadedness with hypotension:Yes Has patient had a PCN reaction causing severe rash involving mucus membranes or skin necrosis: No Has patient had a PCN reaction that required hospitalization No Has patient had a PCN reaction occurring within the last 10 years: No If all of the above answers are "NO", then may proceed with Cephalosporin use.   . Sulfa Antibiotics Rash    HOME MEDICATIONS: Outpatient Medications Prior to Visit  Medication Sig Dispense Refill  . amLODipine-valsartan (EXFORGE) 10-320 MG per tablet Take 1 tablet by mouth daily.    Marland Kitchen BIOTIN 5000 PO Take 5,000 mcg by mouth daily.     . bumetanide (BUMEX) 1 MG tablet Take 1 mg by mouth daily.     . Calcium Citrate-Vitamin D (CITRACAL + D PO) Take 1 tablet by mouth 2 (two) times daily.     . Cholecalciferol (VITAMIN D3) 1000 UNITS CAPS Take 1,000 Units by mouth daily.     . clindamycin (CLEOCIN) 150 MG capsule Take 4 capsules orally prior to dental procedure.     . ferrous gluconate (FERGON) 324 MG tablet Take 324 mg by mouth daily.  6  .  fish oil-omega-3 fatty acids 1000 MG capsule Take 1 g by mouth 2 (two) times daily.     Marland Kitchen latanoprost (XALATAN) 0.005 % ophthalmic solution Place 1 drop into both eyes at bedtime.    Marland Kitchen loratadine (CLARITIN) 10 MG tablet Take 10 mg by mouth daily.     . methocarbamol (ROBAXIN) 500 MG tablet Take 1 tablet (500 mg total) by mouth every 6 (six) hours as needed for muscle spasms. 80 tablet 0  . Multiple Vitamin (MULTIVITAMIN)  tablet Take 1 tablet by mouth daily.    Marland Kitchen oxyCODONE (OXY IR/ROXICODONE) 5 MG immediate release tablet Take 1-2 tablets (5-10 mg total) by mouth every 4 (four) hours as needed for severe pain. 84 tablet 0  . primidone (MYSOLINE) 50 MG tablet TAKE 1/2 A TABLET TWICE A DAY 90 tablet 3  . simvastatin (ZOCOR) 10 MG tablet Take 10 mg by mouth every evening.     . timolol (TIMOPTIC) 0.5 % ophthalmic solution Place 1 drop into both eyes daily.    . traMADol (ULTRAM) 50 MG tablet Take 1 tablet (50 mg total) by mouth every 6 (six) hours as needed for moderate pain. 56 tablet 0  . vitamin C (ASCORBIC ACID) 500 MG tablet Take 500 mg by mouth daily.      No facility-administered medications prior to visit.     PAST MEDICAL HISTORY: Past Medical History:  Diagnosis Date  . Anemia   . Arthritis    arthritis in knees and hands . Right wrist and thumb splinted due to pain.  . Blood transfusion    10/12 after knee surgery   . Blood transfusion without reported diagnosis   . Essential tremor   . Glaucoma   . H/O seasonal allergies    sinus congestion  . Hx of adenomatous polyp of colon 08/18/2014  . Hypertension   . Tremor of both hands    mild , tx. Primidone    PAST SURGICAL HISTORY: Past Surgical History:  Procedure Laterality Date  . ABDOMINAL HYSTERECTOMY     Vaginal '88  . CERVICAL LAMINECTOMY  1995   cervical fusion"some limited ROM"  . CHOLECYSTECTOMY     1999  . COLONOSCOPY    . JOINT REPLACEMENT     right knee 10/12 , left knee 2006   . KNEE ARTHROTOMY Right 07/17/2016   Procedure: KNEE ARTHROTOMY with scar excision;  Surgeon: Gaynelle Arabian, MD;  Location: [facility];  Service: Orthopedics;  Laterality: Right;  spinal+block  . KNEE CLOSED REDUCTION  07/10/2011   Procedure: CLOSED MANIPULATION KNEE;  Surgeon: Gearlean Alf;  Location: [facility];  Service: Orthopedics;  Laterality: Right;  . OTHER SURGICAL HISTORY     right knee replacement 8/11     FAMILY HISTORY: Family History    Problem Relation Age of Onset  . Stroke Mother   . Parkinsonism Mother   . Diabetes Mother   . Colon cancer Neg Hx   . Esophageal cancer Neg Hx   . Rectal cancer Neg Hx   . Stomach cancer Neg Hx     SOCIAL HISTORY:  Social History   Social History  . Marital status: Married    Spouse name: clifford  . Number of children: 1  . Years of education: MA   Occupational History  . retired    Social History Main Topics  . Smoking status: Never Smoker  . Smokeless tobacco: Former Systems developer    Types: [place] date: 05/08/2014  .  Alcohol use No  . Drug use: No  . Sexual activity: Not Currently   Other Topics Concern  . Not on file   Social History Narrative   Patient lives at home with her spouse.   Caffeine Use: 1 cup of coffee   Patient is right handed.      PHYSICAL EXAM  GENERAL EXAM/CONSTITUTIONAL: Vitals:  Vitals:   03/26/17 1331  BP: (!) 157/77  Pulse: 76  Weight: 150 lb (68 kg)  Height: 5\' 4"  (1.626 m)     Body mass index is 25.75 kg/m.  No exam data present  Patient is in no distress; well developed, nourished and groomed; neck is supple  CARDIOVASCULAR:  Examination of carotid arteries is normal; no carotid bruits  Regular rate and rhythm, no murmurs  Examination of peripheral vascular system by observation and palpation is normal  EYES:  Ophthalmoscopic exam of optic discs and posterior segments is normal; no papilledema or hemorrhages  MUSCULOSKELETAL:  Gait, strength, tone, movements noted in Neurologic exam below  NEUROLOGIC: MENTAL STATUS:  No flowsheet data found.  awake, alert, oriented to person, place and time  recent and remote memory intact  normal attention and concentration  language fluent, comprehension intact, naming intact,   fund of knowledge appropriate  CRANIAL NERVE:   2nd - no papilledema on fundoscopic exam  2nd, 3rd, 4th, 6th - pupils equal and reactive to light, visual fields full to confrontation,  extraocular muscles intact, no nystagmus  5th - facial sensation symmetric  7th - facial strength symmetric  8th - hearing intact  9th - palate elevates symmetrically, uvula midline  11th - shoulder shrug symmetric  12th - tongue protrusion midline  MILD VOICE TREMOR  MOTOR:   normal bulk and tone, full strength in the BUE, BLE  MINIMAL POSTURAL TREMOR IN BUE  NO COGWHEELING OR BRADYKINESIA  SENSORY:   normal and symmetric to light touch, temperature, vibration  COORDINATION:   finger-nose-finger, fine finger movements normal  REFLEXES:   deep tendon reflexes --> BUE 2, KNEES ABSENT, ANKLES TRACE  GAIT/STATION:   narrow based gait; SLIGHT STOOPED POSTURE; ANTALGIC FROM LOW BACK PAIN    DIAGNOSTIC DATA (LABS, IMAGING, TESTING) - I reviewed patient records, labs, notes, testing and imaging myself where available.  Lab Results  Component Value Date   WBC 8.6 07/17/2016   HGB 8.5 (L) 07/17/2016   HCT 33.8 (L) 07/17/2016   MCV 71.3 (L) 07/17/2016   PLT 237 07/17/2016      Component Value Date/Time   NA 141 07/11/2016 1426   K 3.7 07/11/2016 1426   CL 101 07/11/2016 1426   CO2 32 07/11/2016 1426   GLUCOSE 116 (H) 07/11/2016 1426   BUN 15 07/11/2016 1426   CREATININE 0.98 07/17/2016 1923   CALCIUM 8.8 (L) 07/11/2016 1426   PROT 7.8 04/26/2011 1220   ALBUMIN 4.0 04/26/2011 1220   AST 23 04/26/2011 1220   ALT 15 04/26/2011 1220   ALKPHOS 75 04/26/2011 1220   BILITOT 0.2 (L) 04/26/2011 1220   GFRNONAA 55 (L) 07/17/2016 1923   GFRAA >60 07/17/2016 1923   No results found for: CHOL, HDL, LDLCALC, LDLDIRECT, TRIG, CHOLHDL No results found for: HGBA1C No results found for: VITAMINB12 No results found for: TSH      ASSESSMENT AND PLAN  76 y.o. year old female here with essential tremor since 2012. Doing well on low dose primidone.   Dx:  1. Essential tremor      PLAN:  I spent 15 minutes of face to face time with patient. Greater than 50%  of time was spent in counseling and coordination of care with patient. In summary we discussed:   - continue primidone 25mg  twice a day  - may follow up with [place] as needed in neurology clinic; patient will ask PCP to refill primidone  Meds ordered this encounter  Medications  . primidone (MYSOLINE) 50 MG tablet    Sig: Take 0.5 tablets (25 mg total) by mouth 2 (two) times daily. TAKE 1/2 A TABLET TWICE A DAY    Dispense:  90 tablet    Refill:  4   Return if symptoms worsen or fail to improve, for return to PCP.    Penni Bombard, MD 1/83/4373, 5:78 PM Certified in Neurology, Neurophysiology and Neuroimaging  [facility] [address] [place], [place] [address] [phone]

## 2017-04-02 ENCOUNTER — Ambulatory Visit
Admission: RE | Admit: 2017-04-02 | Discharge: 2017-04-02 | Disposition: A | Discharge status: Home / Self Care | Payer: Medicare PPO | Source: Ambulatory Visit | Attending: Specialist | Admitting: Specialist

## 2017-04-02 DIAGNOSIS — M545 Low back pain, unspecified: Secondary | ICD-10-CM

## 2017-04-02 DIAGNOSIS — M48061 Spinal stenosis, lumbar region without neurogenic claudication: Secondary | ICD-10-CM | POA: Diagnosis not present

## 2017-04-07 DIAGNOSIS — M4126 Other idiopathic scoliosis, lumbar region: Secondary | ICD-10-CM | POA: Diagnosis not present

## 2017-04-07 DIAGNOSIS — M545 Low back pain: Secondary | ICD-10-CM | POA: Diagnosis not present

## 2017-04-07 DIAGNOSIS — I10 Essential (primary) hypertension: Secondary | ICD-10-CM | POA: Diagnosis not present

## 2017-04-07 DIAGNOSIS — N183 Chronic kidney disease, stage 3 (moderate): Secondary | ICD-10-CM | POA: Diagnosis not present

## 2017-04-07 DIAGNOSIS — G250 Essential tremor: Secondary | ICD-10-CM | POA: Diagnosis not present

## 2017-04-07 DIAGNOSIS — M5136 Other intervertebral disc degeneration, lumbar region: Secondary | ICD-10-CM | POA: Diagnosis not present

## 2017-04-07 DIAGNOSIS — M5090 Cervical disc disorder, unspecified, unspecified cervical region: Secondary | ICD-10-CM | POA: Diagnosis not present

## 2017-04-07 DIAGNOSIS — R7303 Prediabetes: Secondary | ICD-10-CM | POA: Diagnosis not present

## 2017-04-07 DIAGNOSIS — D509 Iron deficiency anemia, unspecified: Secondary | ICD-10-CM | POA: Diagnosis not present

## 2017-04-07 DIAGNOSIS — Z23 Encounter for immunization: Secondary | ICD-10-CM | POA: Diagnosis not present

## 2017-04-07 DIAGNOSIS — M4306 Spondylolysis, lumbar region: Secondary | ICD-10-CM | POA: Diagnosis not present

## 2017-04-07 DIAGNOSIS — E782 Mixed hyperlipidemia: Secondary | ICD-10-CM | POA: Diagnosis not present

## 2017-04-07 DIAGNOSIS — M1990 Unspecified osteoarthritis, unspecified site: Secondary | ICD-10-CM | POA: Diagnosis not present

## 2017-04-16 DIAGNOSIS — M5136 Other intervertebral disc degeneration, lumbar region: Secondary | ICD-10-CM | POA: Diagnosis not present

## 2017-04-18 DIAGNOSIS — M5136 Other intervertebral disc degeneration, lumbar region: Secondary | ICD-10-CM | POA: Diagnosis not present

## 2017-04-21 DIAGNOSIS — M5136 Other intervertebral disc degeneration, lumbar region: Secondary | ICD-10-CM | POA: Diagnosis not present

## 2017-04-24 DIAGNOSIS — M5136 Other intervertebral disc degeneration, lumbar region: Secondary | ICD-10-CM | POA: Diagnosis not present

## 2017-04-28 DIAGNOSIS — M5136 Other intervertebral disc degeneration, lumbar region: Secondary | ICD-10-CM | POA: Diagnosis not present

## 2017-05-01 DIAGNOSIS — M5136 Other intervertebral disc degeneration, lumbar region: Secondary | ICD-10-CM | POA: Diagnosis not present

## 2017-05-05 DIAGNOSIS — M5136 Other intervertebral disc degeneration, lumbar region: Secondary | ICD-10-CM | POA: Diagnosis not present

## 2017-05-08 DIAGNOSIS — M5136 Other intervertebral disc degeneration, lumbar region: Secondary | ICD-10-CM | POA: Diagnosis not present

## 2017-05-26 ENCOUNTER — Other Ambulatory Visit: Payer: Self-pay | Admitting: Urology

## 2017-05-26 DIAGNOSIS — D49519 Neoplasm of unspecified behavior of unspecified kidney: Secondary | ICD-10-CM

## 2017-05-26 DIAGNOSIS — D49512 Neoplasm of unspecified behavior of left kidney: Secondary | ICD-10-CM | POA: Diagnosis not present

## 2017-06-10 ENCOUNTER — Ambulatory Visit
Admission: RE | Admit: 2017-06-10 | Discharge: 2017-06-10 | Disposition: A | Discharge status: Home / Self Care | Payer: Medicare PPO | Source: Ambulatory Visit | Attending: Urology | Admitting: Urology

## 2017-06-10 DIAGNOSIS — N2889 Other specified disorders of kidney and ureter: Secondary | ICD-10-CM | POA: Diagnosis not present

## 2017-06-10 DIAGNOSIS — D49519 Neoplasm of unspecified behavior of unspecified kidney: Secondary | ICD-10-CM

## 2017-06-13 DIAGNOSIS — D3000 Benign neoplasm of unspecified kidney: Secondary | ICD-10-CM | POA: Diagnosis not present

## 2017-06-19 DIAGNOSIS — M25561 Pain in right knee: Secondary | ICD-10-CM | POA: Diagnosis not present

## 2017-06-19 DIAGNOSIS — M25562 Pain in left knee: Secondary | ICD-10-CM | POA: Diagnosis not present

## 2017-06-19 DIAGNOSIS — G8929 Other chronic pain: Secondary | ICD-10-CM | POA: Diagnosis not present

## 2017-07-10 DIAGNOSIS — M48061 Spinal stenosis, lumbar region without neurogenic claudication: Secondary | ICD-10-CM | POA: Diagnosis not present

## 2017-07-10 DIAGNOSIS — M160 Bilateral primary osteoarthritis of hip: Secondary | ICD-10-CM | POA: Diagnosis not present

## 2017-07-16 DIAGNOSIS — M48061 Spinal stenosis, lumbar region without neurogenic claudication: Secondary | ICD-10-CM | POA: Diagnosis not present

## 2017-08-05 DIAGNOSIS — M5136 Other intervertebral disc degeneration, lumbar region: Secondary | ICD-10-CM | POA: Diagnosis not present

## 2017-08-13 DIAGNOSIS — D509 Iron deficiency anemia, unspecified: Secondary | ICD-10-CM | POA: Diagnosis not present

## 2017-08-13 DIAGNOSIS — G250 Essential tremor: Secondary | ICD-10-CM | POA: Diagnosis not present

## 2017-08-13 DIAGNOSIS — E782 Mixed hyperlipidemia: Secondary | ICD-10-CM | POA: Diagnosis not present

## 2017-08-13 DIAGNOSIS — M5136 Other intervertebral disc degeneration, lumbar region: Secondary | ICD-10-CM | POA: Diagnosis not present

## 2017-08-13 DIAGNOSIS — Z1389 Encounter for screening for other disorder: Secondary | ICD-10-CM | POA: Diagnosis not present

## 2017-08-13 DIAGNOSIS — M545 Low back pain: Secondary | ICD-10-CM | POA: Diagnosis not present

## 2017-08-13 DIAGNOSIS — R7303 Prediabetes: Secondary | ICD-10-CM | POA: Diagnosis not present

## 2017-08-13 DIAGNOSIS — N183 Chronic kidney disease, stage 3 (moderate): Secondary | ICD-10-CM | POA: Diagnosis not present

## 2017-08-13 DIAGNOSIS — I10 Essential (primary) hypertension: Secondary | ICD-10-CM | POA: Diagnosis not present

## 2017-08-19 DIAGNOSIS — M4800 Spinal stenosis, site unspecified: Secondary | ICD-10-CM | POA: Insufficient documentation

## 2017-08-19 DIAGNOSIS — M5136 Other intervertebral disc degeneration, lumbar region: Secondary | ICD-10-CM | POA: Diagnosis not present

## 2017-08-19 DIAGNOSIS — M419 Scoliosis, unspecified: Secondary | ICD-10-CM | POA: Insufficient documentation

## 2017-08-28 DIAGNOSIS — M48061 Spinal stenosis, lumbar region without neurogenic claudication: Secondary | ICD-10-CM | POA: Diagnosis not present

## 2017-08-28 DIAGNOSIS — M5417 Radiculopathy, lumbosacral region: Secondary | ICD-10-CM | POA: Diagnosis not present

## 2017-09-04 DIAGNOSIS — I10 Essential (primary) hypertension: Secondary | ICD-10-CM | POA: Diagnosis not present

## 2017-09-04 DIAGNOSIS — N183 Chronic kidney disease, stage 3 (moderate): Secondary | ICD-10-CM | POA: Diagnosis not present

## 2017-09-04 DIAGNOSIS — E782 Mixed hyperlipidemia: Secondary | ICD-10-CM | POA: Diagnosis not present

## 2017-09-04 DIAGNOSIS — D509 Iron deficiency anemia, unspecified: Secondary | ICD-10-CM | POA: Diagnosis not present

## 2017-09-04 DIAGNOSIS — M1990 Unspecified osteoarthritis, unspecified site: Secondary | ICD-10-CM | POA: Diagnosis not present

## 2017-09-04 DIAGNOSIS — H409 Unspecified glaucoma: Secondary | ICD-10-CM | POA: Diagnosis not present

## 2017-09-16 DIAGNOSIS — M5136 Other intervertebral disc degeneration, lumbar region: Secondary | ICD-10-CM | POA: Diagnosis not present

## 2017-09-22 DIAGNOSIS — H524 Presbyopia: Secondary | ICD-10-CM | POA: Diagnosis not present

## 2017-09-22 DIAGNOSIS — H2513 Age-related nuclear cataract, bilateral: Secondary | ICD-10-CM | POA: Diagnosis not present

## 2017-09-22 DIAGNOSIS — H401132 Primary open-angle glaucoma, bilateral, moderate stage: Secondary | ICD-10-CM | POA: Diagnosis not present

## 2017-09-29 DIAGNOSIS — H409 Unspecified glaucoma: Secondary | ICD-10-CM | POA: Diagnosis not present

## 2017-09-29 DIAGNOSIS — M1990 Unspecified osteoarthritis, unspecified site: Secondary | ICD-10-CM | POA: Diagnosis not present

## 2017-09-29 DIAGNOSIS — N183 Chronic kidney disease, stage 3 (moderate): Secondary | ICD-10-CM | POA: Diagnosis not present

## 2017-09-29 DIAGNOSIS — D509 Iron deficiency anemia, unspecified: Secondary | ICD-10-CM | POA: Diagnosis not present

## 2017-09-29 DIAGNOSIS — I10 Essential (primary) hypertension: Secondary | ICD-10-CM | POA: Diagnosis not present

## 2017-09-29 DIAGNOSIS — E782 Mixed hyperlipidemia: Secondary | ICD-10-CM | POA: Diagnosis not present

## 2017-10-07 DIAGNOSIS — I10 Essential (primary) hypertension: Secondary | ICD-10-CM | POA: Diagnosis not present

## 2017-10-07 DIAGNOSIS — E782 Mixed hyperlipidemia: Secondary | ICD-10-CM | POA: Diagnosis not present

## 2017-10-07 DIAGNOSIS — N183 Chronic kidney disease, stage 3 (moderate): Secondary | ICD-10-CM | POA: Diagnosis not present

## 2017-10-07 DIAGNOSIS — H409 Unspecified glaucoma: Secondary | ICD-10-CM | POA: Diagnosis not present

## 2017-10-07 DIAGNOSIS — M1990 Unspecified osteoarthritis, unspecified site: Secondary | ICD-10-CM | POA: Diagnosis not present

## 2017-10-07 DIAGNOSIS — D509 Iron deficiency anemia, unspecified: Secondary | ICD-10-CM | POA: Diagnosis not present

## 2017-10-27 ENCOUNTER — Other Ambulatory Visit: Payer: Self-pay | Admitting: Internal Medicine

## 2017-10-27 DIAGNOSIS — Z1231 Encounter for screening mammogram for malignant neoplasm of breast: Secondary | ICD-10-CM

## 2017-11-27 DIAGNOSIS — M1990 Unspecified osteoarthritis, unspecified site: Secondary | ICD-10-CM | POA: Diagnosis not present

## 2017-11-27 DIAGNOSIS — N183 Chronic kidney disease, stage 3 (moderate): Secondary | ICD-10-CM | POA: Diagnosis not present

## 2017-11-27 DIAGNOSIS — H409 Unspecified glaucoma: Secondary | ICD-10-CM | POA: Diagnosis not present

## 2017-11-27 DIAGNOSIS — E782 Mixed hyperlipidemia: Secondary | ICD-10-CM | POA: Diagnosis not present

## 2017-11-27 DIAGNOSIS — D509 Iron deficiency anemia, unspecified: Secondary | ICD-10-CM | POA: Diagnosis not present

## 2017-11-27 DIAGNOSIS — I10 Essential (primary) hypertension: Secondary | ICD-10-CM | POA: Diagnosis not present

## 2017-12-03 ENCOUNTER — Ambulatory Visit
Admission: RE | Admit: 2017-12-03 | Discharge: 2017-12-03 | Disposition: A | Discharge status: Home / Self Care | Payer: Medicare PPO | Source: Ambulatory Visit | Attending: Internal Medicine | Admitting: Internal Medicine

## 2017-12-03 DIAGNOSIS — Z1231 Encounter for screening mammogram for malignant neoplasm of breast: Secondary | ICD-10-CM

## 2017-12-30 DIAGNOSIS — M5136 Other intervertebral disc degeneration, lumbar region: Secondary | ICD-10-CM | POA: Diagnosis not present

## 2018-01-02 DIAGNOSIS — N183 Chronic kidney disease, stage 3 (moderate): Secondary | ICD-10-CM | POA: Diagnosis not present

## 2018-01-02 DIAGNOSIS — D509 Iron deficiency anemia, unspecified: Secondary | ICD-10-CM | POA: Diagnosis not present

## 2018-01-02 DIAGNOSIS — E782 Mixed hyperlipidemia: Secondary | ICD-10-CM | POA: Diagnosis not present

## 2018-01-02 DIAGNOSIS — M1990 Unspecified osteoarthritis, unspecified site: Secondary | ICD-10-CM | POA: Diagnosis not present

## 2018-01-02 DIAGNOSIS — H409 Unspecified glaucoma: Secondary | ICD-10-CM | POA: Diagnosis not present

## 2018-01-02 DIAGNOSIS — I10 Essential (primary) hypertension: Secondary | ICD-10-CM | POA: Diagnosis not present

## 2018-02-04 DIAGNOSIS — M1990 Unspecified osteoarthritis, unspecified site: Secondary | ICD-10-CM | POA: Diagnosis not present

## 2018-02-04 DIAGNOSIS — N183 Chronic kidney disease, stage 3 (moderate): Secondary | ICD-10-CM | POA: Diagnosis not present

## 2018-02-04 DIAGNOSIS — I10 Essential (primary) hypertension: Secondary | ICD-10-CM | POA: Diagnosis not present

## 2018-02-04 DIAGNOSIS — D509 Iron deficiency anemia, unspecified: Secondary | ICD-10-CM | POA: Diagnosis not present

## 2018-02-04 DIAGNOSIS — H409 Unspecified glaucoma: Secondary | ICD-10-CM | POA: Diagnosis not present

## 2018-02-04 DIAGNOSIS — E782 Mixed hyperlipidemia: Secondary | ICD-10-CM | POA: Diagnosis not present

## 2018-02-11 DIAGNOSIS — N183 Chronic kidney disease, stage 3 (moderate): Secondary | ICD-10-CM | POA: Diagnosis not present

## 2018-02-11 DIAGNOSIS — E782 Mixed hyperlipidemia: Secondary | ICD-10-CM | POA: Diagnosis not present

## 2018-02-11 DIAGNOSIS — Z1389 Encounter for screening for other disorder: Secondary | ICD-10-CM | POA: Diagnosis not present

## 2018-02-11 DIAGNOSIS — G250 Essential tremor: Secondary | ICD-10-CM | POA: Diagnosis not present

## 2018-02-11 DIAGNOSIS — I10 Essential (primary) hypertension: Secondary | ICD-10-CM | POA: Diagnosis not present

## 2018-02-11 DIAGNOSIS — R7303 Prediabetes: Secondary | ICD-10-CM | POA: Diagnosis not present

## 2018-02-11 DIAGNOSIS — M1990 Unspecified osteoarthritis, unspecified site: Secondary | ICD-10-CM | POA: Diagnosis not present

## 2018-02-11 DIAGNOSIS — Z0000 Encounter for general adult medical examination without abnormal findings: Secondary | ICD-10-CM | POA: Diagnosis not present

## 2018-02-11 DIAGNOSIS — H409 Unspecified glaucoma: Secondary | ICD-10-CM | POA: Diagnosis not present

## 2018-02-11 DIAGNOSIS — D509 Iron deficiency anemia, unspecified: Secondary | ICD-10-CM | POA: Diagnosis not present

## 2018-02-16 DIAGNOSIS — E782 Mixed hyperlipidemia: Secondary | ICD-10-CM | POA: Diagnosis not present

## 2018-02-16 DIAGNOSIS — D509 Iron deficiency anemia, unspecified: Secondary | ICD-10-CM | POA: Diagnosis not present

## 2018-02-16 DIAGNOSIS — M1990 Unspecified osteoarthritis, unspecified site: Secondary | ICD-10-CM | POA: Diagnosis not present

## 2018-02-16 DIAGNOSIS — H409 Unspecified glaucoma: Secondary | ICD-10-CM | POA: Diagnosis not present

## 2018-02-16 DIAGNOSIS — I10 Essential (primary) hypertension: Secondary | ICD-10-CM | POA: Diagnosis not present

## 2018-02-16 DIAGNOSIS — N183 Chronic kidney disease, stage 3 (moderate): Secondary | ICD-10-CM | POA: Diagnosis not present

## 2018-03-30 DIAGNOSIS — D509 Iron deficiency anemia, unspecified: Secondary | ICD-10-CM | POA: Diagnosis not present

## 2018-03-30 DIAGNOSIS — M1990 Unspecified osteoarthritis, unspecified site: Secondary | ICD-10-CM | POA: Diagnosis not present

## 2018-03-30 DIAGNOSIS — N183 Chronic kidney disease, stage 3 (moderate): Secondary | ICD-10-CM | POA: Diagnosis not present

## 2018-03-30 DIAGNOSIS — E782 Mixed hyperlipidemia: Secondary | ICD-10-CM | POA: Diagnosis not present

## 2018-03-30 DIAGNOSIS — H409 Unspecified glaucoma: Secondary | ICD-10-CM | POA: Diagnosis not present

## 2018-03-30 DIAGNOSIS — I10 Essential (primary) hypertension: Secondary | ICD-10-CM | POA: Diagnosis not present

## 2018-04-01 DIAGNOSIS — H401132 Primary open-angle glaucoma, bilateral, moderate stage: Secondary | ICD-10-CM | POA: Diagnosis not present

## 2018-04-16 DIAGNOSIS — Z23 Encounter for immunization: Secondary | ICD-10-CM | POA: Diagnosis not present

## 2018-04-22 DIAGNOSIS — M4800 Spinal stenosis, site unspecified: Secondary | ICD-10-CM | POA: Diagnosis not present

## 2018-04-22 DIAGNOSIS — M160 Bilateral primary osteoarthritis of hip: Secondary | ICD-10-CM | POA: Diagnosis not present

## 2018-04-22 DIAGNOSIS — M5136 Other intervertebral disc degeneration, lumbar region: Secondary | ICD-10-CM | POA: Diagnosis not present

## 2018-04-22 DIAGNOSIS — M419 Scoliosis, unspecified: Secondary | ICD-10-CM | POA: Diagnosis not present

## 2018-05-01 DIAGNOSIS — I10 Essential (primary) hypertension: Secondary | ICD-10-CM | POA: Diagnosis not present

## 2018-05-01 DIAGNOSIS — H409 Unspecified glaucoma: Secondary | ICD-10-CM | POA: Diagnosis not present

## 2018-05-01 DIAGNOSIS — M1990 Unspecified osteoarthritis, unspecified site: Secondary | ICD-10-CM | POA: Diagnosis not present

## 2018-05-01 DIAGNOSIS — N183 Chronic kidney disease, stage 3 (moderate): Secondary | ICD-10-CM | POA: Diagnosis not present

## 2018-05-01 DIAGNOSIS — E782 Mixed hyperlipidemia: Secondary | ICD-10-CM | POA: Diagnosis not present

## 2018-05-01 DIAGNOSIS — D509 Iron deficiency anemia, unspecified: Secondary | ICD-10-CM | POA: Diagnosis not present

## 2018-05-18 DIAGNOSIS — N183 Chronic kidney disease, stage 3 (moderate): Secondary | ICD-10-CM | POA: Diagnosis not present

## 2018-05-18 DIAGNOSIS — H409 Unspecified glaucoma: Secondary | ICD-10-CM | POA: Diagnosis not present

## 2018-05-18 DIAGNOSIS — E782 Mixed hyperlipidemia: Secondary | ICD-10-CM | POA: Diagnosis not present

## 2018-05-18 DIAGNOSIS — D509 Iron deficiency anemia, unspecified: Secondary | ICD-10-CM | POA: Diagnosis not present

## 2018-05-18 DIAGNOSIS — I10 Essential (primary) hypertension: Secondary | ICD-10-CM | POA: Diagnosis not present

## 2018-05-18 DIAGNOSIS — M1990 Unspecified osteoarthritis, unspecified site: Secondary | ICD-10-CM | POA: Diagnosis not present

## 2018-05-31 ENCOUNTER — Other Ambulatory Visit: Payer: Self-pay | Admitting: Diagnostic Neuroimaging

## 2018-06-01 ENCOUNTER — Other Ambulatory Visit: Payer: Self-pay | Admitting: Diagnostic Neuroimaging

## 2018-06-12 DIAGNOSIS — L600 Ingrowing nail: Secondary | ICD-10-CM | POA: Diagnosis not present

## 2018-06-12 DIAGNOSIS — B351 Tinea unguium: Secondary | ICD-10-CM | POA: Diagnosis not present

## 2018-06-12 DIAGNOSIS — B079 Viral wart, unspecified: Secondary | ICD-10-CM | POA: Diagnosis not present

## 2018-06-12 DIAGNOSIS — B353 Tinea pedis: Secondary | ICD-10-CM | POA: Diagnosis not present

## 2018-06-12 DIAGNOSIS — M79671 Pain in right foot: Secondary | ICD-10-CM | POA: Diagnosis not present

## 2018-06-12 DIAGNOSIS — M79672 Pain in left foot: Secondary | ICD-10-CM | POA: Diagnosis not present

## 2018-06-15 ENCOUNTER — Telehealth: Payer: Self-pay | Admitting: Diagnostic Neuroimaging

## 2018-06-15 NOTE — Telephone Encounter (Signed)
Pt states she normally gets a 90 day prescription for primidone (MYSOLINE) 50 MG tablet.  This time she only received 30.  Pt has scheduled her annual f/u for 12-11.  Pt asking if remaining 60 pills can be called in to pharmacy

## 2018-06-15 NOTE — Telephone Encounter (Signed)
LVM informing patient that the medication can be refilled with 90 day supply when she sees NP in a few days. Advised her the pharmacy wouldn't be able to give her a Rx for 60 tabs now because it is too early to refill from refill Rx on 06/01/18.  She should have enough medication to last through scheduled FU on 06/17/18. Left number for any questions.

## 2018-06-17 ENCOUNTER — Encounter: Payer: Self-pay | Admitting: Adult Health

## 2018-06-17 ENCOUNTER — Ambulatory Visit (INDEPENDENT_AMBULATORY_CARE_PROVIDER_SITE_OTHER): Payer: Medicare PPO | Admitting: Adult Health

## 2018-06-17 VITALS — BP 190/80 | HR 77 | Ht 64.0 in | Wt 152.4 lb

## 2018-06-17 DIAGNOSIS — G250 Essential tremor: Secondary | ICD-10-CM | POA: Diagnosis not present

## 2018-06-17 MED ORDER — PRIMIDONE 50 MG PO TABS: ORAL_TABLET | ORAL | 3 refills | Status: DC

## 2018-06-17 NOTE — Patient Instructions (Signed)
Your Plan:  Continue primidone If your symptoms worsen or you develop new symptoms please let us know.   Thank you for coming to see [place] at [facility]. I hope we have been able to provide you high quality care today.  You may receive a patient satisfaction survey over the next few weeks. We would appreciate your feedback and comments so that we may continue to improve ourselves and the health of our patients.

## 2018-06-17 NOTE — Progress Notes (Signed)
PATIENT: Jenna Howe DOB: 1941/04/10  REASON FOR VISIT: follow up HISTORY FROM: patient  HISTORY OF PRESENT ILLNESS: Today 06/17/18: Jenna Howe is a 77 year old female with a history of essential tremor.  She returns today for follow-up.  She is currently taking primidone a half a tablet twice a day and reports it is working well for her tremor.  She denies any trouble eating.  Reports that she is able to complete all ADLs independently.  She has no trouble fastening buttons.  She states that it affects both hands equally.  She returns today for evaluation.  HISTORY UPDATE (03/26/17, VRP): Since last visit, doing well. Tolerating primidone 25mg  twice a day. No alleviating or aggravating factors.   UPDATE (03/25/2016, MM): Jenna Howe is a 77 year old female with a history of essential tremor. She returns today for follow-up. She is currently taking primidone 25 mg twice a day. She reports that this controls her tremor quite well. She is able to eat and dress herself without any difficulty. She states that stress is a trigger for her tremor. She feels that her tremor has improved. She denies any new neurological symptoms. She returns today for an evaluation.  UPDATE (03/27/15, MM): Jenna Howe is a 77 year old female with a history of essential tremor. She returns today for follow-up. She is currently taking primidone 25 mg twice a day. She reports that her tremor is under good control. She states that if she is under a lot of stress or in a rush it'll become more prevalent. The tremor primarily affects the left hand. She denies any new neurological symptoms. She returns today for an evaluation  PRIOR HPI (01/23/11, VRP): 77 year old right-handed female with hypertension, history of cervical laminectomy and discectomy, arthritis, bilateral total knee replacements, here for evaluation of tremor since Jan 2012.The patient reports gradual, progressive tremor of bilateral upper extremities (left  greater than right), mainly when she is doing specific tasks such as applying makeup, putting on earrings, using a spoon or drinking from a glass. She denies any tremor at rest. She denies any new smell or taste difficulties, sleep disturbances or walking problems. She denies any swallowing difficulties. She has a brother with Parkinson's disease. No other family history of tremor   REVIEW OF SYSTEMS: Out of a complete 14 system review of symptoms, the patient complains only of the following symptoms, and all other reviewed systems are negative.  See HPI  ALLERGIES: Allergies  Allergen Reactions  . Clinoril [Sulindac]     Stomach upset  . Celebrex [Celecoxib] Rash    Stomach upset  . Penicillins Rash    Has patient had a PCN reaction causing immediate rash, facial/tongue/throat swelling, SOB or lightheadedness with hypotension:Yes Has patient had a PCN reaction causing severe rash involving mucus membranes or skin necrosis: No Has patient had a PCN reaction that required hospitalization No Has patient had a PCN reaction occurring within the last 10 years: No If all of the above answers are "NO", then may proceed with Cephalosporin use.   . Sulfa Antibiotics Rash    HOME MEDICATIONS: Outpatient Medications Prior to Visit  Medication Sig Dispense Refill  . BIOTIN 5000 PO Take 5,000 mcg by mouth daily.     . bumetanide (BUMEX) 1 MG tablet Take 1 mg by mouth daily.     . Calcium Citrate-Vitamin D (CITRACAL + D PO) Take 1 tablet by mouth 2 (two) times daily.     . Cholecalciferol (VITAMIN D3) 1000 UNITS CAPS Take 1,000  Units by mouth daily.     . clindamycin (CLEOCIN) 150 MG capsule Take 4 capsules orally prior to dental procedure.     . ferrous gluconate (FERGON) 324 MG tablet Take 324 mg by mouth daily.  6  . fish oil-omega-3 fatty acids 1000 MG capsule Take 1 g by mouth 2 (two) times daily.     Marland Kitchen latanoprost (XALATAN) 0.005 % ophthalmic solution Place 1 drop into both eyes at bedtime.     Marland Kitchen loratadine (CLARITIN) 10 MG tablet Take 10 mg by mouth daily.     . methocarbamol (ROBAXIN) 500 MG tablet Take 1 tablet (500 mg total) by mouth every 6 (six) hours as needed for muscle spasms. 80 tablet 0  . Multiple Vitamin (MULTIVITAMIN) tablet Take 1 tablet by mouth daily.    Marland Kitchen oxyCODONE (OXY IR/ROXICODONE) 5 MG immediate release tablet Take 1-2 tablets (5-10 mg total) by mouth every 4 (four) hours as needed for severe pain. 84 tablet 0  . primidone (MYSOLINE) 50 MG tablet TAKE 1/2 A TABLET TWICE A DAY 30 tablet 0  . simvastatin (ZOCOR) 10 MG tablet Take 10 mg by mouth every evening.     . timolol (TIMOPTIC) 0.5 % ophthalmic solution Place 1 drop into both eyes daily.    . traMADol (ULTRAM) 50 MG tablet Take 1 tablet (50 mg total) by mouth every 6 (six) hours as needed for moderate pain. 56 tablet 0  . vitamin C (ASCORBIC ACID) 500 MG tablet Take 500 mg by mouth daily.     Marland Kitchen amLODipine-valsartan (EXFORGE) 10-320 MG per tablet Take 1 tablet by mouth daily.     No facility-administered medications prior to visit.     PAST MEDICAL HISTORY: Past Medical History:  Diagnosis Date  . Anemia   . Arthritis    arthritis in knees and hands . Right wrist and thumb splinted due to pain.  . Blood transfusion    10/12 after knee surgery   . Blood transfusion without reported diagnosis   . Essential tremor   . Glaucoma   . H/O seasonal allergies    sinus congestion  . Hx of adenomatous polyp of colon 08/18/2014  . Hypertension   . Tremor of both hands    mild , tx. Primidone    PAST SURGICAL HISTORY: Past Surgical History:  Procedure Laterality Date  . ABDOMINAL HYSTERECTOMY     Vaginal '88  . CERVICAL LAMINECTOMY  1995   cervical fusion"some limited ROM"  . CHOLECYSTECTOMY     1999  . COLONOSCOPY    . JOINT REPLACEMENT     right knee 10/12 , left knee 2006   . KNEE ARTHROTOMY Right 07/17/2016   Procedure: KNEE ARTHROTOMY with scar excision;  Surgeon: Gaynelle Arabian, MD;   Location: [facility];  Service: Orthopedics;  Laterality: Right;  spinal+block  . KNEE CLOSED REDUCTION  07/10/2011   Procedure: CLOSED MANIPULATION KNEE;  Surgeon: Gearlean Alf;  Location: [facility];  Service: Orthopedics;  Laterality: Right;  . OTHER SURGICAL HISTORY     right knee replacement 8/11     FAMILY HISTORY: Family History  Problem Relation Age of Onset  . Stroke Mother   . Parkinsonism Mother   . Diabetes Mother   . Colon cancer Neg Hx   . Esophageal cancer Neg Hx   . Rectal cancer Neg Hx   . Stomach cancer Neg Hx     SOCIAL HISTORY: Social History   Socioeconomic History  . Marital status: Married  Spouse name: clifford  . Number of children: 1  . Years of education: MA  . Highest education level: Not on file  Occupational History  . Occupation: retired  Scientific laboratory technician  . Financial resource strain: Not on file  . Food insecurity:    Worry: Not on file    Inability: Not on file  . Transportation needs:    Medical: Not on file    Non-medical: Not on file  Tobacco Use  . Smoking status: Never Smoker  . Smokeless tobacco: Former Systems developer    Types: Chew  Substance and Sexual Activity  . Alcohol use: No    Alcohol/week: 0.0 standard drinks  . Drug use: No  . Sexual activity: Not Currently  Lifestyle  . Physical activity:    Days per week: Not on file    Minutes per session: Not on file  . Stress: Not on file  Relationships  . Social connections:    Talks on phone: Not on file    Gets together: Not on file    Attends religious service: Not on file    Active member of club or organization: Not on file    Attends meetings of clubs or organizations: Not on file    Relationship status: Not on file  . Intimate partner violence:    Fear of current or ex partner: Not on file    Emotionally abused: Not on file    Physically abused: Not on file    Forced sexual activity: Not on file  Other Topics Concern  . Not on file  Social History Narrative   Patient lives  at home with her spouse.   Caffeine Use: 1 cup of coffee   Patient is right handed.       PHYSICAL EXAM  Vitals:   06/17/18 0918  BP: (!) 190/80  Pulse: 77  Weight: 152 lb 6.4 oz (69.1 kg)  Height: 5\' 4"  (1.626 m)   Body mass index is 26.16 kg/m.  Generalized: Well developed, in no acute distress   Neurological examination  Mentation: Alert oriented to time, place, history taking. Follows all commands speech and language fluent Cranial nerve II-XII: Pupils were equal round reactive to light. Extraocular movements were full, visual field were full on confrontational test. Facial sensation and strength were normal. Uvula tongue midline. Head turning and shoulder shrug  were normal and symmetric. Motor: The motor testing reveals 5 over 5 strength of all 4 extremities. Good symmetric motor tone is noted throughout.  Sensory: Sensory testing is intact to soft touch on all 4 extremities. No evidence of extinction is noted.  Coordination: Cerebellar testing reveals good finger-nose-finger and heel-to-shin bilaterally.  Intention tremor noted in both hands bilaterally Gait and station: Gait is normal.  Reflexes: Deep tendon reflexes are symmetric and normal bilaterally.   DIAGNOSTIC DATA (LABS, IMAGING, TESTING) - I reviewed patient records, labs, notes, testing and imaging myself where available.  Lab Results  Component Value Date   WBC 8.6 07/17/2016   HGB 8.5 (L) 07/17/2016   HCT 33.8 (L) 07/17/2016   MCV 71.3 (L) 07/17/2016   PLT 237 07/17/2016      Component Value Date/Time   NA 141 07/11/2016 1426   K 3.7 07/11/2016 1426   CL 101 07/11/2016 1426   CO2 32 07/11/2016 1426   GLUCOSE 116 (H) 07/11/2016 1426   BUN 15 07/11/2016 1426   CREATININE 0.98 07/17/2016 1923   CALCIUM 8.8 (L) 07/11/2016 1426   PROT 7.8 04/26/2011  1220   ALBUMIN 4.0 04/26/2011 1220   AST 23 04/26/2011 1220   ALT 15 04/26/2011 1220   ALKPHOS 75 04/26/2011 1220   BILITOT 0.2 (L) 04/26/2011 1220     GFRNONAA 55 (L) 07/17/2016 1923   GFRAA >60 07/17/2016 1923    ASSESSMENT AND PLAN 77 y.o. year old female  has a past medical history of Anemia, Arthritis, Blood transfusion, Blood transfusion without reported diagnosis, Essential tremor, Glaucoma, H/O seasonal allergies, adenomatous polyp of colon (08/18/2014), Hypertension, and Tremor of both hands. here with:  1.  Essential tremor  The patient will continue on primidone half a tablet twice a day.  I have advised patient to keep checking her blood pressure.  If it remains elevated she should discuss with her primary care provider.  She is advised that if her symptoms worsen or she develops new symptoms she should let us know.  She will follow-up in 1 year or sooner if needed.   I spent 15 minutes with the patient. 50% of this time was spent reviewing plan of care   Ward Givens, MSN, NP-C 06/17/2018, 9:44 AM [facility] [address], [place], [place] [address] [phone]

## 2018-06-22 DIAGNOSIS — D509 Iron deficiency anemia, unspecified: Secondary | ICD-10-CM | POA: Diagnosis not present

## 2018-06-22 DIAGNOSIS — I10 Essential (primary) hypertension: Secondary | ICD-10-CM | POA: Diagnosis not present

## 2018-06-22 DIAGNOSIS — H409 Unspecified glaucoma: Secondary | ICD-10-CM | POA: Diagnosis not present

## 2018-06-22 DIAGNOSIS — E782 Mixed hyperlipidemia: Secondary | ICD-10-CM | POA: Diagnosis not present

## 2018-06-22 DIAGNOSIS — M1990 Unspecified osteoarthritis, unspecified site: Secondary | ICD-10-CM | POA: Diagnosis not present

## 2018-06-22 DIAGNOSIS — N183 Chronic kidney disease, stage 3 (moderate): Secondary | ICD-10-CM | POA: Diagnosis not present

## 2018-07-13 DIAGNOSIS — M79675 Pain in left toe(s): Secondary | ICD-10-CM | POA: Diagnosis not present

## 2018-07-13 DIAGNOSIS — M79674 Pain in right toe(s): Secondary | ICD-10-CM | POA: Diagnosis not present

## 2018-07-13 DIAGNOSIS — B351 Tinea unguium: Secondary | ICD-10-CM | POA: Diagnosis not present

## 2018-07-13 DIAGNOSIS — B079 Viral wart, unspecified: Secondary | ICD-10-CM | POA: Diagnosis not present

## 2018-07-13 DIAGNOSIS — L600 Ingrowing nail: Secondary | ICD-10-CM | POA: Diagnosis not present

## 2018-07-23 NOTE — Progress Notes (Signed)
I reviewed note and agree with plan.   Penni Bombard, MD

## 2018-07-27 DIAGNOSIS — B079 Viral wart, unspecified: Secondary | ICD-10-CM | POA: Diagnosis not present

## 2018-08-07 DIAGNOSIS — M5136 Other intervertebral disc degeneration, lumbar region: Secondary | ICD-10-CM | POA: Diagnosis not present

## 2018-08-10 DIAGNOSIS — M79674 Pain in right toe(s): Secondary | ICD-10-CM | POA: Diagnosis not present

## 2018-08-10 DIAGNOSIS — M79675 Pain in left toe(s): Secondary | ICD-10-CM | POA: Diagnosis not present

## 2018-08-10 DIAGNOSIS — L600 Ingrowing nail: Secondary | ICD-10-CM | POA: Diagnosis not present

## 2018-08-10 DIAGNOSIS — B351 Tinea unguium: Secondary | ICD-10-CM | POA: Diagnosis not present

## 2018-08-18 DIAGNOSIS — G250 Essential tremor: Secondary | ICD-10-CM | POA: Diagnosis not present

## 2018-08-18 DIAGNOSIS — R7303 Prediabetes: Secondary | ICD-10-CM | POA: Diagnosis not present

## 2018-08-18 DIAGNOSIS — M1990 Unspecified osteoarthritis, unspecified site: Secondary | ICD-10-CM | POA: Diagnosis not present

## 2018-08-18 DIAGNOSIS — M5090 Cervical disc disorder, unspecified, unspecified cervical region: Secondary | ICD-10-CM | POA: Diagnosis not present

## 2018-08-18 DIAGNOSIS — M545 Low back pain: Secondary | ICD-10-CM | POA: Diagnosis not present

## 2018-08-18 DIAGNOSIS — E782 Mixed hyperlipidemia: Secondary | ICD-10-CM | POA: Diagnosis not present

## 2018-08-18 DIAGNOSIS — I10 Essential (primary) hypertension: Secondary | ICD-10-CM | POA: Diagnosis not present

## 2018-08-18 DIAGNOSIS — N183 Chronic kidney disease, stage 3 (moderate): Secondary | ICD-10-CM | POA: Diagnosis not present

## 2018-08-18 DIAGNOSIS — J309 Allergic rhinitis, unspecified: Secondary | ICD-10-CM | POA: Diagnosis not present

## 2018-09-07 DIAGNOSIS — L600 Ingrowing nail: Secondary | ICD-10-CM | POA: Diagnosis not present

## 2018-09-07 DIAGNOSIS — M79674 Pain in right toe(s): Secondary | ICD-10-CM | POA: Diagnosis not present

## 2018-09-07 DIAGNOSIS — B351 Tinea unguium: Secondary | ICD-10-CM | POA: Diagnosis not present

## 2018-09-07 DIAGNOSIS — M79675 Pain in left toe(s): Secondary | ICD-10-CM | POA: Diagnosis not present

## 2018-09-24 DIAGNOSIS — M722 Plantar fascial fibromatosis: Secondary | ICD-10-CM | POA: Diagnosis not present

## 2018-10-26 DIAGNOSIS — M722 Plantar fascial fibromatosis: Secondary | ICD-10-CM | POA: Diagnosis not present

## 2018-10-26 DIAGNOSIS — M79671 Pain in right foot: Secondary | ICD-10-CM | POA: Diagnosis not present

## 2018-11-03 ENCOUNTER — Other Ambulatory Visit: Payer: Self-pay | Admitting: Internal Medicine

## 2018-11-03 DIAGNOSIS — Z1231 Encounter for screening mammogram for malignant neoplasm of breast: Secondary | ICD-10-CM

## 2018-12-09 DIAGNOSIS — L600 Ingrowing nail: Secondary | ICD-10-CM | POA: Diagnosis not present

## 2018-12-09 DIAGNOSIS — M79675 Pain in left toe(s): Secondary | ICD-10-CM | POA: Diagnosis not present

## 2018-12-29 ENCOUNTER — Other Ambulatory Visit: Payer: Self-pay

## 2018-12-29 ENCOUNTER — Ambulatory Visit
Admission: RE | Admit: 2018-12-29 | Discharge: 2018-12-29 | Disposition: A | Discharge status: Home / Self Care | Payer: Medicare HMO | Source: Ambulatory Visit | Attending: Internal Medicine | Admitting: Internal Medicine

## 2018-12-29 DIAGNOSIS — Z1231 Encounter for screening mammogram for malignant neoplasm of breast: Secondary | ICD-10-CM

## 2019-01-05 DIAGNOSIS — Z20828 Contact with and (suspected) exposure to other viral communicable diseases: Secondary | ICD-10-CM | POA: Diagnosis not present

## 2019-02-02 DIAGNOSIS — H1131 Conjunctival hemorrhage, right eye: Secondary | ICD-10-CM | POA: Diagnosis not present

## 2019-02-02 DIAGNOSIS — H2000 Unspecified acute and subacute iridocyclitis: Secondary | ICD-10-CM | POA: Diagnosis not present

## 2019-02-05 DIAGNOSIS — S0501XA Injury of conjunctiva and corneal abrasion without foreign body, right eye, initial encounter: Secondary | ICD-10-CM | POA: Diagnosis not present

## 2019-02-05 DIAGNOSIS — H2000 Unspecified acute and subacute iridocyclitis: Secondary | ICD-10-CM | POA: Diagnosis not present

## 2019-02-22 DIAGNOSIS — M1990 Unspecified osteoarthritis, unspecified site: Secondary | ICD-10-CM | POA: Diagnosis not present

## 2019-02-22 DIAGNOSIS — I10 Essential (primary) hypertension: Secondary | ICD-10-CM | POA: Diagnosis not present

## 2019-02-22 DIAGNOSIS — E782 Mixed hyperlipidemia: Secondary | ICD-10-CM | POA: Diagnosis not present

## 2019-02-22 DIAGNOSIS — R7303 Prediabetes: Secondary | ICD-10-CM | POA: Diagnosis not present

## 2019-02-22 DIAGNOSIS — Z1389 Encounter for screening for other disorder: Secondary | ICD-10-CM | POA: Diagnosis not present

## 2019-02-22 DIAGNOSIS — D509 Iron deficiency anemia, unspecified: Secondary | ICD-10-CM | POA: Diagnosis not present

## 2019-02-22 DIAGNOSIS — N183 Chronic kidney disease, stage 3 (moderate): Secondary | ICD-10-CM | POA: Diagnosis not present

## 2019-02-22 DIAGNOSIS — G250 Essential tremor: Secondary | ICD-10-CM | POA: Diagnosis not present

## 2019-02-22 DIAGNOSIS — Z0000 Encounter for general adult medical examination without abnormal findings: Secondary | ICD-10-CM | POA: Diagnosis not present

## 2019-03-18 ENCOUNTER — Other Ambulatory Visit: Payer: Self-pay | Admitting: Adult Health

## 2019-03-31 DIAGNOSIS — Z23 Encounter for immunization: Secondary | ICD-10-CM | POA: Diagnosis not present

## 2019-04-07 DIAGNOSIS — H2513 Age-related nuclear cataract, bilateral: Secondary | ICD-10-CM | POA: Diagnosis not present

## 2019-04-07 DIAGNOSIS — H401132 Primary open-angle glaucoma, bilateral, moderate stage: Secondary | ICD-10-CM | POA: Diagnosis not present

## 2019-04-07 DIAGNOSIS — H52203 Unspecified astigmatism, bilateral: Secondary | ICD-10-CM | POA: Diagnosis not present

## 2019-06-09 DIAGNOSIS — Z01411 Encounter for gynecological examination (general) (routine) with abnormal findings: Secondary | ICD-10-CM | POA: Diagnosis not present

## 2019-06-16 DIAGNOSIS — I10 Essential (primary) hypertension: Secondary | ICD-10-CM | POA: Diagnosis not present

## 2019-06-16 DIAGNOSIS — M545 Low back pain: Secondary | ICD-10-CM | POA: Diagnosis not present

## 2019-06-18 DIAGNOSIS — M7500 Adhesive capsulitis of unspecified shoulder: Secondary | ICD-10-CM | POA: Diagnosis not present

## 2019-06-18 DIAGNOSIS — I10 Essential (primary) hypertension: Secondary | ICD-10-CM | POA: Diagnosis not present

## 2019-06-21 ENCOUNTER — Other Ambulatory Visit: Payer: Self-pay

## 2019-06-21 ENCOUNTER — Encounter: Payer: Self-pay | Admitting: Diagnostic Neuroimaging

## 2019-06-21 ENCOUNTER — Ambulatory Visit (INDEPENDENT_AMBULATORY_CARE_PROVIDER_SITE_OTHER): Payer: Medicare HMO | Admitting: Diagnostic Neuroimaging

## 2019-06-21 VITALS — BP 176/73 | HR 73 | Temp 97.4°F | Ht 61.0 in | Wt 151.2 lb

## 2019-06-21 DIAGNOSIS — G250 Essential tremor: Secondary | ICD-10-CM

## 2019-06-21 MED ORDER — PRIMIDONE 50 MG PO TABS: 25.0000 mg | ORAL_TABLET | Freq: Two times a day (BID) | ORAL | 4 refills | Status: DC

## 2019-06-21 NOTE — Patient Instructions (Signed)
Continue primidone 

## 2019-06-21 NOTE — Progress Notes (Signed)
[facility]  PATIENT: Jenna Howe DOB: 1940-08-22  REFERRING CLINICIAN:  HISTORY FROM: patient  REASON FOR VISIT: follow up   HISTORICAL  CHIEF COMPLAINT:  Chief Complaint  Patient presents with  . Essential Tremor    rm 6, one YR FU "tremors are much better"    HISTORY OF PRESENT ILLNESS:   UPDATE (06/21/19, VRP): Since last visit, doing well. Tremors are improved. No new issues. Tolerating primidone 25mg  twice a day.     UPDATE (03/26/17, VRP): Since last visit, doing well. Tolerating primidone 25mg  twice a day. No alleviating or aggravating factors.   UPDATE (03/25/2016, MM): Jenna Howe is a 78 year old female with a history of essential tremor. She returns today for follow-up. She is currently taking primidone 25 mg twice a day. She reports that this controls her tremor quite well. She is able to eat and dress herself without any difficulty. She states that stress is a trigger for her tremor. She feels that her tremor has improved. She denies any new neurological symptoms. She returns today for an evaluation.  UPDATE (03/27/15, MM): Jenna Howe is a 78 year old female with a history of essential tremor. She returns today for follow-up. She is currently taking primidone 25 mg twice a day. She reports that her tremor is under good control. She states that if she is under a lot of stress or in a rush it'll become more prevalent. The tremor primarily affects the left hand. She denies any new neurological symptoms. She returns today for an evaluation  PRIOR HPI (01/23/11, VRP): 78 year old right-handed female with hypertension, history of cervical laminectomy and discectomy, arthritis, bilateral total knee replacements, here for evaluation of tremor since Jan 2012.The patient reports gradual, progressive tremor of bilateral upper extremities (left greater than right), mainly when she is doing specific tasks such as applying makeup, putting on earrings, using a  spoon or drinking from a glass. She denies any tremor at rest. She denies any new smell or taste difficulties, sleep disturbances or walking problems. She denies any swallowing difficulties. She has a brother with Parkinson's disease. No other family history of tremor   REVIEW OF SYSTEMS: Full 14 system review of systems performed and negative with exception of: joint pain walking diff.  ALLERGIES: Allergies  Allergen Reactions  . Clinoril [Sulindac]     Stomach upset  . Celebrex [Celecoxib] Rash    Stomach upset  . Penicillins Rash    Has patient had a PCN reaction causing immediate rash, facial/tongue/throat swelling, SOB or lightheadedness with hypotension:Yes Has patient had a PCN reaction causing severe rash involving mucus membranes or skin necrosis: No Has patient had a PCN reaction that required hospitalization No Has patient had a PCN reaction occurring within the last 10 years: No If all of the above answers are "NO", then may proceed with Cephalosporin use.   . Sulfa Antibiotics Rash    HOME MEDICATIONS: Outpatient Medications Prior to Visit  Medication Sig Dispense Refill  . AMLODIPINE BESYLATE PO Take 5 mg by mouth daily.    Marland Kitchen BIOTIN 5000 PO Take 5,000 mcg by mouth daily.     . bumetanide (BUMEX) 1 MG tablet Take 1 mg by mouth daily.     . Calcium Citrate-Vitamin D (CITRACAL + D PO) Take 1 tablet by mouth 2 (two) times daily.     . Cholecalciferol (VITAMIN D3) 1000 UNITS CAPS Take 1,000 Units by mouth daily.     . clindamycin (CLEOCIN) 150 MG capsule Take 4  capsules orally prior to dental procedure.     . ferrous gluconate (FERGON) 324 MG tablet Take 324 mg by mouth daily.  6  . fish oil-omega-3 fatty acids 1000 MG capsule Take 1 g by mouth 2 (two) times daily.     Marland Kitchen latanoprost (XALATAN) 0.005 % ophthalmic solution Place 1 drop into both eyes at bedtime.    Marland Kitchen loratadine (CLARITIN) 10 MG tablet Take 10 mg by mouth daily.     . Multiple Vitamin (MULTIVITAMIN) tablet  Take 1 tablet by mouth daily.    Marland Kitchen oxyCODONE (OXY IR/ROXICODONE) 5 MG immediate release tablet Take 1-2 tablets (5-10 mg total) by mouth every 4 (four) hours as needed for severe pain. 84 tablet 0  . predniSONE (DELTASONE) 10 MG tablet Take 10 mg by mouth 2 (two) times daily with a meal.    . primidone (MYSOLINE) 50 MG tablet TAKE 1/2 A TABLET TWICE A DAY 90 tablet 3  . simvastatin (ZOCOR) 10 MG tablet Take 10 mg by mouth every evening.     . timolol (TIMOPTIC) 0.5 % ophthalmic solution Place 1 drop into both eyes daily.    Marland Kitchen tiZANidine (ZANAFLEX) 4 MG capsule Take 4 mg by mouth 3 (three) times daily. As needed    . traMADol (ULTRAM) 50 MG tablet Take 1 tablet (50 mg total) by mouth every 6 (six) hours as needed for moderate pain. 56 tablet 0  . vitamin C (ASCORBIC ACID) 500 MG tablet Take 500 mg by mouth daily.     Marland Kitchen amLODipine-valsartan (EXFORGE) 10-320 MG per tablet Take 1 tablet by mouth daily.    . methocarbamol (ROBAXIN) 500 MG tablet Take 1 tablet (500 mg total) by mouth every 6 (six) hours as needed for muscle spasms. 80 tablet 0   No facility-administered medications prior to visit.    PAST MEDICAL HISTORY: Past Medical History:  Diagnosis Date  . Anemia   . Arthritis    arthritis in knees and hands . Right wrist and thumb splinted due to pain.  . Blood transfusion    10/12 after knee surgery   . Blood transfusion without reported diagnosis   . Essential tremor   . Glaucoma   . H/O seasonal allergies    sinus congestion  . Hx of adenomatous polyp of colon 08/18/2014  . Hypertension   . Tremor of both hands    mild , tx. Primidone    PAST SURGICAL HISTORY: Past Surgical History:  Procedure Laterality Date  . ABDOMINAL HYSTERECTOMY     Vaginal '88  . CERVICAL LAMINECTOMY  1995   cervical fusion"some limited ROM"  . CHOLECYSTECTOMY     1999  . COLONOSCOPY    . JOINT REPLACEMENT     right knee 10/12 , left knee 2006   . KNEE ARTHROTOMY Right 07/17/2016   Procedure:  KNEE ARTHROTOMY with scar excision;  Surgeon: Gaynelle Arabian, MD;  Location: [facility];  Service: Orthopedics;  Laterality: Right;  spinal+block  . KNEE CLOSED REDUCTION  07/10/2011   Procedure: CLOSED MANIPULATION KNEE;  Surgeon: Gearlean Alf;  Location: [facility];  Service: Orthopedics;  Laterality: Right;  . OTHER SURGICAL HISTORY     right knee replacement 8/11     FAMILY HISTORY: Family History  Problem Relation Age of Onset  . Stroke Mother   . Parkinsonism Mother   . Diabetes Mother   . Colon cancer Neg Hx   . Esophageal cancer Neg Hx   . Rectal cancer Neg Hx   .  Stomach cancer Neg Hx     SOCIAL HISTORY:  Social History   Socioeconomic History  . Marital status: Married    Spouse name: clifford  . Number of children: 1  . Years of education: MA  . Highest education level: Not on file  Occupational History  . Occupation: retired  Tobacco Use  . Smoking status: Never Smoker  . Smokeless tobacco: Former Systems developer    Types: Chew  Substance and Sexual Activity  . Alcohol use: No    Alcohol/week: 0.0 standard drinks  . Drug use: No  . Sexual activity: Not Currently  Other Topics Concern  . Not on file  Social History Narrative   Patient lives at home with her spouse.   Caffeine Use: 1 cup of coffee   Patient is right handed.    Social Determinants of Health   Financial Resource Strain:   . Difficulty of Paying Living Expenses: Not on file  Food Insecurity:   . Worried About Charity fundraiser in the Last Year: Not on file  . Ran Out of Food in the Last Year: Not on file  Transportation Needs:   . Lack of Transportation (Medical): Not on file  . Lack of Transportation (Non-Medical): Not on file  Physical Activity:   . Days of Exercise per Week: Not on file  . Minutes of Exercise per Session: Not on file  Stress:   . Feeling of Stress : Not on file  Social Connections:   . Frequency of Communication with Friends and Family: Not on file  . Frequency of Social  Gatherings with Friends and Family: Not on file  . Attends Religious Services: Not on file  . Active Member of Clubs or Organizations: Not on file  . Attends Archivist Meetings: Not on file  . Marital Status: Not on file  Intimate Partner Violence:   . Fear of Current or Ex-Partner: Not on file  . Emotionally Abused: Not on file  . Physically Abused: Not on file  . Sexually Abused: Not on file     PHYSICAL EXAM  GENERAL EXAM/CONSTITUTIONAL: Vitals:  Vitals:   06/21/19 1533  BP: (!) 176/73  Pulse: 73  Temp: (!) 97.4 F (36.3 C)  Weight: 151 lb 3.2 oz (68.6 kg)  Height: 5\' 1"  (1.549 m)   Body mass index is 28.57 kg/m. No exam data present  Patient is in no distress; well developed, nourished and groomed; neck is supple  CARDIOVASCULAR:  Examination of carotid arteries is normal; no carotid bruits  Regular rate and rhythm, no murmurs  Examination of peripheral vascular system by observation and palpation is normal  EYES:  Ophthalmoscopic exam of optic discs and posterior segments is normal; no papilledema or hemorrhages  MUSCULOSKELETAL:  Gait, strength, tone, movements noted in Neurologic exam below  NEUROLOGIC: MENTAL STATUS:  No flowsheet data found.  awake, alert, oriented to person, place and time  recent and remote memory intact  normal attention and concentration  language fluent, comprehension intact, naming intact,   fund of knowledge appropriate  CRANIAL NERVE:   2nd - no papilledema on fundoscopic exam  2nd, 3rd, 4th, 6th - pupils equal and reactive to light, visual fields full to confrontation, extraocular muscles intact, no nystagmus  5th - facial sensation symmetric  7th - facial strength symmetric  8th - hearing intact  9th - palate elevates symmetrically, uvula midline  11th - shoulder shrug symmetric  12th - tongue protrusion midline  MILD VOICE  TREMOR  MOTOR:   normal bulk and tone, full strength in the BUE,  BLE  MINIMAL POSTURAL TREMOR IN LUE  NO COGWHEELING OR BRADYKINESIA  SENSORY:   normal and symmetric to light touch, temperature, vibration  COORDINATION:   finger-nose-finger, fine finger movements normal  REFLEXES:   deep tendon reflexes --> BUE 2, KNEES ABSENT, ANKLES TRACE  GAIT/STATION:   SLIGHT STOOPED POSTURE; ANTALGIC FROM LOW BACK PAIN    DIAGNOSTIC DATA (LABS, IMAGING, TESTING) - I reviewed patient records, labs, notes, testing and imaging myself where available.  Lab Results  Component Value Date   WBC 8.6 07/17/2016   HGB 8.5 (L) 07/17/2016   HCT 33.8 (L) 07/17/2016   MCV 71.3 (L) 07/17/2016   PLT 237 07/17/2016      Component Value Date/Time   NA 141 07/11/2016 1426   K 3.7 07/11/2016 1426   CL 101 07/11/2016 1426   CO2 32 07/11/2016 1426   GLUCOSE 116 (H) 07/11/2016 1426   BUN 15 07/11/2016 1426   CREATININE 0.98 07/17/2016 1923   CALCIUM 8.8 (L) 07/11/2016 1426   PROT 7.8 04/26/2011 1220   ALBUMIN 4.0 04/26/2011 1220   AST 23 04/26/2011 1220   ALT 15 04/26/2011 1220   ALKPHOS 75 04/26/2011 1220   BILITOT 0.2 (L) 04/26/2011 1220   GFRNONAA 55 (L) 07/17/2016 1923   GFRAA >60 07/17/2016 1923   No results found for: CHOL, HDL, LDLCALC, LDLDIRECT, TRIG, CHOLHDL No results found for: HGBA1C No results found for: VITAMINB12 No results found for: TSH      ASSESSMENT AND PLAN  78 y.o. year old female here with essential tremor since 2012. Doing well on low dose primidone.   Dx:  1. Essential tremor      PLAN:  ESSENTIAL TREMOR - continue primidone 25mg  twice a day  HYPERTENSION  - may be increased due to prednisone for back pain; follow up with PCP   Meds ordered this encounter  Medications  . primidone (MYSOLINE) 50 MG tablet    Sig: Take 0.5 tablets (25 mg total) by mouth 2 (two) times daily. TAKE 1/2 A TABLET TWICE A DAY    Dispense:  90 tablet    Refill:  4   Return in about 1 year (around 06/20/2020) for with NP (Amy  Lomax).    Penni Bombard, MD 0000000, [id number] PM Certified in Neurology, Neurophysiology and Neuroimaging  [facility] [address] [place], [place] [address] [phone]

## 2019-06-23 ENCOUNTER — Other Ambulatory Visit: Payer: Self-pay | Admitting: Physician Assistant

## 2019-06-23 DIAGNOSIS — M545 Low back pain: Secondary | ICD-10-CM | POA: Diagnosis not present

## 2019-06-23 DIAGNOSIS — I10 Essential (primary) hypertension: Secondary | ICD-10-CM | POA: Diagnosis not present

## 2019-06-24 ENCOUNTER — Other Ambulatory Visit: Payer: Self-pay | Admitting: Physician Assistant

## 2019-06-24 DIAGNOSIS — I10 Essential (primary) hypertension: Secondary | ICD-10-CM

## 2019-06-29 DIAGNOSIS — I10 Essential (primary) hypertension: Secondary | ICD-10-CM | POA: Diagnosis not present

## 2019-06-29 DIAGNOSIS — M545 Low back pain: Secondary | ICD-10-CM | POA: Diagnosis not present

## 2019-07-08 ENCOUNTER — Ambulatory Visit
Admission: RE | Admit: 2019-07-08 | Discharge: 2019-07-08 | Disposition: A | Discharge status: Home / Self Care | Payer: Medicare HMO | Source: Ambulatory Visit | Attending: Physician Assistant | Admitting: Physician Assistant

## 2019-07-08 DIAGNOSIS — I10 Essential (primary) hypertension: Secondary | ICD-10-CM

## 2019-07-08 DIAGNOSIS — Z8679 Personal history of other diseases of the circulatory system: Secondary | ICD-10-CM | POA: Diagnosis not present

## 2019-07-14 ENCOUNTER — Other Ambulatory Visit: Payer: Self-pay | Admitting: Internal Medicine

## 2019-07-14 DIAGNOSIS — I701 Atherosclerosis of renal artery: Secondary | ICD-10-CM

## 2019-07-27 ENCOUNTER — Other Ambulatory Visit: Payer: Self-pay

## 2019-07-27 ENCOUNTER — Ambulatory Visit
Admission: RE | Admit: 2019-07-27 | Discharge: 2019-07-27 | Disposition: A | Discharge status: Home / Self Care | Payer: Medicare HMO | Source: Ambulatory Visit | Attending: Internal Medicine | Admitting: Internal Medicine

## 2019-07-27 DIAGNOSIS — I700 Atherosclerosis of aorta: Secondary | ICD-10-CM | POA: Diagnosis not present

## 2019-07-27 DIAGNOSIS — I701 Atherosclerosis of renal artery: Secondary | ICD-10-CM

## 2019-07-27 DIAGNOSIS — N281 Cyst of kidney, acquired: Secondary | ICD-10-CM | POA: Diagnosis not present

## 2019-07-27 DIAGNOSIS — N1330 Unspecified hydronephrosis: Secondary | ICD-10-CM | POA: Diagnosis not present

## 2019-07-27 MED ORDER — IOPAMIDOL (ISOVUE-370) INJECTION 76%
75.0000 mL | Freq: Once | INTRAVENOUS | Status: AC | PRN
  Administered 2019-07-27: 75 mL via INTRAVENOUS

## 2019-08-16 ENCOUNTER — Encounter (HOSPITAL_COMMUNITY): Payer: Self-pay

## 2019-08-17 ENCOUNTER — Ambulatory Visit (INDEPENDENT_AMBULATORY_CARE_PROVIDER_SITE_OTHER): Payer: Medicare HMO | Admitting: Vascular Surgery

## 2019-08-17 ENCOUNTER — Encounter: Payer: Self-pay | Admitting: Vascular Surgery

## 2019-08-17 ENCOUNTER — Other Ambulatory Visit: Payer: Self-pay

## 2019-08-17 VITALS — BP 186/83 | HR 92 | Temp 97.8°F | Resp 20 | Ht 61.0 in | Wt 151.0 lb

## 2019-08-17 DIAGNOSIS — I701 Atherosclerosis of renal artery: Secondary | ICD-10-CM

## 2019-08-17 NOTE — Progress Notes (Signed)
[facility]  Patient name: Jenna Howe MRN: [id number] DOB: 07-07-1941 Sex: female  REASON FOR CONSULT: Evaluation of potential renal artery stenosis  HPI: Jenna Howe is a 79 y.o. female, who is here today for discussion of renal artery stenosis.  She is a very pleasant 79 year old with a hypertension.  She underwent renal artery duplex on 07/08/2019.  This suggested possible renal artery stenosis.  Ultrasound findings suggested right kidney 10.5 cm and left somewhat smaller at 8.4 cm.  She is here for further discussion.  She is on 3 different hypertensive medications.  She reports that her pressure is relatively well controlled with this.  Past Medical History:  Diagnosis Date  . Anemia   . Arthritis    arthritis in knees and hands . Right wrist and thumb splinted due to pain.  . Blood transfusion    10/12 after knee surgery   . Blood transfusion without reported diagnosis   . Essential tremor   . Glaucoma   . H/O seasonal allergies    sinus congestion  . Hx of adenomatous polyp of colon 08/18/2014  . Hypertension   . Tremor of both hands    mild , tx. Primidone    Family History  Problem Relation Age of Onset  . Stroke Mother   . Parkinsonism Mother   . Diabetes Mother   . Colon cancer Neg Hx   . Esophageal cancer Neg Hx   . Rectal cancer Neg Hx   . Stomach cancer Neg Hx     SOCIAL HISTORY: Social History   Socioeconomic History  . Marital status: Married    Spouse name: clifford  . Number of children: 1  . Years of education: MA  . Highest education level: Not on file  Occupational History  . Occupation: retired  Tobacco Use  . Smoking status: Never Smoker  . Smokeless tobacco: Former Systems developer    Types: Chew  Substance and Sexual Activity  . Alcohol use: No    Alcohol/week: 0.0 standard drinks  . Drug use: No  . Sexual activity: Not Currently  Other Topics Concern  . Not on file   Social History Narrative   Patient lives at home with her spouse.   Caffeine Use: 1 cup of coffee   Patient is right handed.    Social Determinants of Health   Financial Resource Strain:   . Difficulty of Paying Living Expenses: Not on file  Food Insecurity:   . Worried About Charity fundraiser in the Last Year: Not on file  . Ran Out of Food in the Last Year: Not on file  Transportation Needs:   . Lack of Transportation (Medical): Not on file  . Lack of Transportation (Non-Medical): Not on file  Physical Activity:   . Days of Exercise per Week: Not on file  . Minutes of Exercise per Session: Not on file  Stress:   . Feeling of Stress : Not on file  Social Connections:   . Frequency of Communication with Friends and Family: Not on file  . Frequency of Social Gatherings with Friends and Family: Not on file  . Attends Religious Services: Not on file  . Active Member of Clubs or Organizations: Not on file  . Attends Archivist Meetings: Not on file  . Marital Status: Not on file  Intimate Partner Violence:   . Fear of Current or Ex-Partner: Not on file  . Emotionally Abused: Not on  file  . Physically Abused: Not on file  . Sexually Abused: Not on file    Allergies  Allergen Reactions  . Clinoril [Sulindac]     Stomach upset  . Celebrex [Celecoxib] Rash    Stomach upset  . Penicillins Rash    Has patient had a PCN reaction causing immediate rash, facial/tongue/throat swelling, SOB or lightheadedness with hypotension:Yes Has patient had a PCN reaction causing severe rash involving mucus membranes or skin necrosis: No Has patient had a PCN reaction that required hospitalization No Has patient had a PCN reaction occurring within the last 10 years: No If all of the above answers are "NO", then may proceed with Cephalosporin use.   . Sulfa Antibiotics Rash    Current Outpatient Medications  Medication Sig Dispense Refill  . AMLODIPINE BESYLATE PO Take 5 mg by  mouth daily.    Marland Kitchen BIOTIN 5000 PO Take 5,000 mcg by mouth daily.     . bumetanide (BUMEX) 1 MG tablet Take 1 mg by mouth daily.     . Calcium Citrate-Vitamin D (CITRACAL + D PO) Take 1 tablet by mouth 2 (two) times daily.     . Cholecalciferol (VITAMIN D3) 1000 UNITS CAPS Take 1,000 Units by mouth daily.     . clindamycin (CLEOCIN) 150 MG capsule Take 4 capsules orally prior to dental procedure.     . ferrous gluconate (FERGON) 324 MG tablet Take 324 mg by mouth daily.  6  . fish oil-omega-3 fatty acids 1000 MG capsule Take 1 g by mouth 2 (two) times daily.     Marland Kitchen latanoprost (XALATAN) 0.005 % ophthalmic solution Place 1 drop into both eyes at bedtime.    Marland Kitchen loratadine (CLARITIN) 10 MG tablet Take 10 mg by mouth daily.     . Multiple Vitamin (MULTIVITAMIN) tablet Take 1 tablet by mouth daily.    Marland Kitchen oxyCODONE (OXY IR/ROXICODONE) 5 MG immediate release tablet Take 1-2 tablets (5-10 mg total) by mouth every 4 (four) hours as needed for severe pain. 84 tablet 0  . predniSONE (DELTASONE) 10 MG tablet Take 10 mg by mouth 2 (two) times daily with a meal.    . primidone (MYSOLINE) 50 MG tablet Take 0.5 tablets (25 mg total) by mouth 2 (two) times daily. TAKE 1/2 A TABLET TWICE A DAY 90 tablet 4  . simvastatin (ZOCOR) 10 MG tablet Take 10 mg by mouth every evening.     . timolol (TIMOPTIC) 0.5 % ophthalmic solution Place 1 drop into both eyes daily.    Marland Kitchen tiZANidine (ZANAFLEX) 4 MG capsule Take 4 mg by mouth 3 (three) times daily. As needed    . traMADol (ULTRAM) 50 MG tablet Take 1 tablet (50 mg total) by mouth every 6 (six) hours as needed for moderate pain. 56 tablet 0  . vitamin C (ASCORBIC ACID) 500 MG tablet Take 500 mg by mouth daily.      No current facility-administered medications for this visit.    REVIEW OF SYSTEMS:  [X]  denotes positive finding, [ ]  denotes negative finding Cardiac  Comments:  Chest pain or chest pressure:    Shortness of breath upon exertion:    Short of breath when lying  flat:    Irregular heart rhythm:        Vascular    Pain in calf, thigh, or hip brought on by ambulation:    Pain in feet at night that wakes you up from your sleep:     Blood clot in  your veins:    Leg swelling:         Pulmonary    Oxygen at home:    Productive cough:     Wheezing:         Neurologic    Sudden weakness in arms or legs:     Sudden numbness in arms or legs:     Sudden onset of difficulty speaking or slurred speech:    Temporary loss of vision in one eye:     Problems with dizziness:         Gastrointestinal    Blood in stool:     Vomited blood:         Genitourinary    Burning when urinating:     Blood in urine:        Psychiatric    Major depression:         Hematologic    Bleeding problems:    Problems with blood clotting too easily:        Skin    Rashes or ulcers:        Constitutional    Fever or chills:      PHYSICAL EXAM: Vitals:   08/17/19 1349  BP: (!) 186/83  Pulse: 92  Resp: 20  Temp: 97.8 F (36.6 C)  SpO2: 99%  Weight: 151 lb (68.5 kg)  Height: 5\' 1"  (1.549 m)    GENERAL: The patient is a well-nourished female, in no acute distress. The vital signs are documented above. CARDIOVASCULAR: She does have a harsh right carotid bruit.  I do not hear a left carotid bruit.  She has a left carotid incision from old cervical spine surgery.  2+ radial pulses. PULMONARY: There is good air exchange  ABDOMEN: Soft and non-tender.  No bruit noted MUSCULOSKELETAL: There are no major deformities or cyanosis. NEUROLOGIC: No focal weakness or paresthesias are detected. SKIN: There are no ulcers or rashes noted. PSYCHIATRIC: The patient has a normal affect.  DATA:  Renal duplex shows differential in size with a 10-1/2 cm right kidney and 8 and half centimeter left kidney  She subsequently underwent CT angiogram and I have this for review as well.  This shows widely patent left renal artery and moderate stenosis at the origin of her right  kidney.  MEDICAL ISSUES: Had long discussion patient regarding these findings.  I do not see any evidence of critical renal artery disease.  I would not recommend arteriography or consideration of intervention based on her current studies.  She will continue with medical treatment of her hypertension  She did have a finding on physical exam of a right carotid bruit.  She has no history of stroke.  I have recommended carotid duplex to rule out asymptomatic significant stenosis.  We will notify her of the findings of this exam   Rosetta Posner, MD [facility] Tel [phone] Pager [phone]

## 2019-08-18 ENCOUNTER — Telehealth (HOSPITAL_COMMUNITY): Payer: Self-pay

## 2019-08-18 ENCOUNTER — Other Ambulatory Visit: Payer: Self-pay | Admitting: *Deleted

## 2019-08-18 DIAGNOSIS — R0989 Other specified symptoms and signs involving the circulatory and respiratory systems: Secondary | ICD-10-CM

## 2019-08-18 NOTE — Telephone Encounter (Signed)

## 2019-08-19 ENCOUNTER — Ambulatory Visit (HOSPITAL_COMMUNITY)
Admission: RE | Admit: 2019-08-19 | Discharge: 2019-08-19 | Disposition: A | Discharge status: Home / Self Care | Payer: Medicare HMO | Source: Ambulatory Visit | Attending: Surgery | Admitting: Surgery

## 2019-08-19 ENCOUNTER — Other Ambulatory Visit: Payer: Self-pay

## 2019-08-19 DIAGNOSIS — R0989 Other specified symptoms and signs involving the circulatory and respiratory systems: Secondary | ICD-10-CM | POA: Diagnosis not present

## 2019-08-23 DIAGNOSIS — M79671 Pain in right foot: Secondary | ICD-10-CM | POA: Diagnosis not present

## 2019-08-23 DIAGNOSIS — R7309 Other abnormal glucose: Secondary | ICD-10-CM | POA: Diagnosis not present

## 2019-08-23 DIAGNOSIS — G250 Essential tremor: Secondary | ICD-10-CM | POA: Diagnosis not present

## 2019-08-23 DIAGNOSIS — I739 Peripheral vascular disease, unspecified: Secondary | ICD-10-CM | POA: Diagnosis not present

## 2019-08-23 DIAGNOSIS — I10 Essential (primary) hypertension: Secondary | ICD-10-CM | POA: Diagnosis not present

## 2019-08-23 DIAGNOSIS — I701 Atherosclerosis of renal artery: Secondary | ICD-10-CM | POA: Diagnosis not present

## 2019-08-23 DIAGNOSIS — N1831 Chronic kidney disease, stage 3a: Secondary | ICD-10-CM | POA: Diagnosis not present

## 2019-08-23 DIAGNOSIS — M519 Unspecified thoracic, thoracolumbar and lumbosacral intervertebral disc disorder: Secondary | ICD-10-CM | POA: Diagnosis not present

## 2019-09-08 ENCOUNTER — Telehealth: Payer: Self-pay | Admitting: Diagnostic Neuroimaging

## 2019-09-08 NOTE — Telephone Encounter (Signed)
New pharmacy added, old pharmacy deleted.

## 2019-09-08 NOTE — Telephone Encounter (Signed)
This is FYI, [facility] called to report that pt has a new packaging pharmacy: [facility] [phone]

## 2019-10-06 DIAGNOSIS — H401132 Primary open-angle glaucoma, bilateral, moderate stage: Secondary | ICD-10-CM | POA: Diagnosis not present

## 2019-10-06 DIAGNOSIS — N1831 Chronic kidney disease, stage 3a: Secondary | ICD-10-CM | POA: Diagnosis not present

## 2019-10-06 DIAGNOSIS — M1990 Unspecified osteoarthritis, unspecified site: Secondary | ICD-10-CM | POA: Diagnosis not present

## 2019-10-06 DIAGNOSIS — D509 Iron deficiency anemia, unspecified: Secondary | ICD-10-CM | POA: Diagnosis not present

## 2019-10-06 DIAGNOSIS — E782 Mixed hyperlipidemia: Secondary | ICD-10-CM | POA: Diagnosis not present

## 2019-10-06 DIAGNOSIS — H409 Unspecified glaucoma: Secondary | ICD-10-CM | POA: Diagnosis not present

## 2019-10-06 DIAGNOSIS — I10 Essential (primary) hypertension: Secondary | ICD-10-CM | POA: Diagnosis not present

## 2019-10-06 DIAGNOSIS — H2513 Age-related nuclear cataract, bilateral: Secondary | ICD-10-CM | POA: Diagnosis not present

## 2019-10-06 DIAGNOSIS — I701 Atherosclerosis of renal artery: Secondary | ICD-10-CM | POA: Diagnosis not present

## 2019-10-25 ENCOUNTER — Telehealth: Payer: Self-pay | Admitting: Diagnostic Neuroimaging

## 2019-10-25 DIAGNOSIS — G250 Essential tremor: Secondary | ICD-10-CM

## 2019-10-25 MED ORDER — PRIMIDONE 50 MG PO TABS: 25.0000 mg | ORAL_TABLET | Freq: Two times a day (BID) | ORAL | 2 refills | Status: DC

## 2019-10-25 NOTE — Addendum Note (Signed)
Addended by: Minna Antis on: 10/25/2019 02:32 PM   Modules accepted: Orders

## 2019-10-25 NOTE — Telephone Encounter (Signed)
Phone rep checked office voicemail's,[email or web address] left voicemail @ 11:15 stating pt needs a refill on her  primidone (MYSOLINE) 50 MG tablet thru [facility]

## 2019-10-25 NOTE — Telephone Encounter (Addendum)
Refill sent to [facility] pharmacy. Called patient and advised her of Rx sent to [facility] pharmacy. She  verbalized understanding, appreciation.

## 2019-10-28 DIAGNOSIS — N1831 Chronic kidney disease, stage 3a: Secondary | ICD-10-CM | POA: Diagnosis not present

## 2019-10-28 DIAGNOSIS — I701 Atherosclerosis of renal artery: Secondary | ICD-10-CM | POA: Diagnosis not present

## 2019-10-28 DIAGNOSIS — D509 Iron deficiency anemia, unspecified: Secondary | ICD-10-CM | POA: Diagnosis not present

## 2019-10-28 DIAGNOSIS — I10 Essential (primary) hypertension: Secondary | ICD-10-CM | POA: Diagnosis not present

## 2019-10-28 DIAGNOSIS — N183 Chronic kidney disease, stage 3 unspecified: Secondary | ICD-10-CM | POA: Diagnosis not present

## 2019-10-28 DIAGNOSIS — E782 Mixed hyperlipidemia: Secondary | ICD-10-CM | POA: Diagnosis not present

## 2019-10-28 DIAGNOSIS — H409 Unspecified glaucoma: Secondary | ICD-10-CM | POA: Diagnosis not present

## 2019-10-28 DIAGNOSIS — M1990 Unspecified osteoarthritis, unspecified site: Secondary | ICD-10-CM | POA: Diagnosis not present

## 2019-11-22 DIAGNOSIS — I701 Atherosclerosis of renal artery: Secondary | ICD-10-CM | POA: Diagnosis not present

## 2019-11-22 DIAGNOSIS — I739 Peripheral vascular disease, unspecified: Secondary | ICD-10-CM | POA: Diagnosis not present

## 2019-11-22 DIAGNOSIS — R7303 Prediabetes: Secondary | ICD-10-CM | POA: Diagnosis not present

## 2019-11-22 DIAGNOSIS — I10 Essential (primary) hypertension: Secondary | ICD-10-CM | POA: Diagnosis not present

## 2019-11-22 DIAGNOSIS — E782 Mixed hyperlipidemia: Secondary | ICD-10-CM | POA: Diagnosis not present

## 2019-11-22 DIAGNOSIS — G250 Essential tremor: Secondary | ICD-10-CM | POA: Diagnosis not present

## 2019-11-22 DIAGNOSIS — M5136 Other intervertebral disc degeneration, lumbar region: Secondary | ICD-10-CM | POA: Diagnosis not present

## 2019-11-22 DIAGNOSIS — N1831 Chronic kidney disease, stage 3a: Secondary | ICD-10-CM | POA: Diagnosis not present

## 2019-11-23 DIAGNOSIS — I10 Essential (primary) hypertension: Secondary | ICD-10-CM | POA: Diagnosis not present

## 2019-11-23 DIAGNOSIS — I701 Atherosclerosis of renal artery: Secondary | ICD-10-CM | POA: Diagnosis not present

## 2019-11-23 DIAGNOSIS — H409 Unspecified glaucoma: Secondary | ICD-10-CM | POA: Diagnosis not present

## 2019-11-23 DIAGNOSIS — M1990 Unspecified osteoarthritis, unspecified site: Secondary | ICD-10-CM | POA: Diagnosis not present

## 2019-11-23 DIAGNOSIS — D509 Iron deficiency anemia, unspecified: Secondary | ICD-10-CM | POA: Diagnosis not present

## 2019-11-23 DIAGNOSIS — E782 Mixed hyperlipidemia: Secondary | ICD-10-CM | POA: Diagnosis not present

## 2019-11-23 DIAGNOSIS — N183 Chronic kidney disease, stage 3 unspecified: Secondary | ICD-10-CM | POA: Diagnosis not present

## 2019-11-23 DIAGNOSIS — N1831 Chronic kidney disease, stage 3a: Secondary | ICD-10-CM | POA: Diagnosis not present

## 2019-11-26 ENCOUNTER — Other Ambulatory Visit: Payer: Self-pay | Admitting: Internal Medicine

## 2019-11-26 DIAGNOSIS — Z1231 Encounter for screening mammogram for malignant neoplasm of breast: Secondary | ICD-10-CM

## 2019-12-02 DIAGNOSIS — M545 Low back pain: Secondary | ICD-10-CM | POA: Diagnosis not present

## 2019-12-08 DIAGNOSIS — M545 Low back pain: Secondary | ICD-10-CM | POA: Diagnosis not present

## 2019-12-09 DIAGNOSIS — M545 Low back pain: Secondary | ICD-10-CM | POA: Diagnosis not present

## 2019-12-13 DIAGNOSIS — M545 Low back pain: Secondary | ICD-10-CM | POA: Diagnosis not present

## 2019-12-15 DIAGNOSIS — M545 Low back pain: Secondary | ICD-10-CM | POA: Diagnosis not present

## 2019-12-20 DIAGNOSIS — M545 Low back pain: Secondary | ICD-10-CM | POA: Diagnosis not present

## 2019-12-22 DIAGNOSIS — M545 Low back pain: Secondary | ICD-10-CM | POA: Diagnosis not present

## 2019-12-23 ENCOUNTER — Other Ambulatory Visit: Payer: Self-pay | Admitting: Internal Medicine

## 2019-12-23 DIAGNOSIS — M7989 Other specified soft tissue disorders: Secondary | ICD-10-CM

## 2019-12-27 DIAGNOSIS — M1990 Unspecified osteoarthritis, unspecified site: Secondary | ICD-10-CM | POA: Diagnosis not present

## 2019-12-27 DIAGNOSIS — M545 Low back pain: Secondary | ICD-10-CM | POA: Diagnosis not present

## 2019-12-27 DIAGNOSIS — N1831 Chronic kidney disease, stage 3a: Secondary | ICD-10-CM | POA: Diagnosis not present

## 2019-12-27 DIAGNOSIS — I10 Essential (primary) hypertension: Secondary | ICD-10-CM | POA: Diagnosis not present

## 2019-12-27 DIAGNOSIS — H409 Unspecified glaucoma: Secondary | ICD-10-CM | POA: Diagnosis not present

## 2019-12-27 DIAGNOSIS — E782 Mixed hyperlipidemia: Secondary | ICD-10-CM | POA: Diagnosis not present

## 2019-12-27 DIAGNOSIS — N183 Chronic kidney disease, stage 3 unspecified: Secondary | ICD-10-CM | POA: Diagnosis not present

## 2019-12-27 DIAGNOSIS — D509 Iron deficiency anemia, unspecified: Secondary | ICD-10-CM | POA: Diagnosis not present

## 2019-12-27 DIAGNOSIS — I701 Atherosclerosis of renal artery: Secondary | ICD-10-CM | POA: Diagnosis not present

## 2019-12-29 ENCOUNTER — Other Ambulatory Visit: Payer: Medicare HMO

## 2019-12-29 DIAGNOSIS — M545 Low back pain: Secondary | ICD-10-CM | POA: Diagnosis not present

## 2019-12-30 ENCOUNTER — Other Ambulatory Visit: Payer: Self-pay | Admitting: Internal Medicine

## 2019-12-31 ENCOUNTER — Ambulatory Visit
Admission: RE | Admit: 2019-12-31 | Discharge: 2019-12-31 | Disposition: A | Discharge status: Home / Self Care | Payer: Medicare HMO | Source: Ambulatory Visit | Attending: Internal Medicine | Admitting: Internal Medicine

## 2019-12-31 ENCOUNTER — Other Ambulatory Visit: Payer: Self-pay

## 2019-12-31 DIAGNOSIS — Z1231 Encounter for screening mammogram for malignant neoplasm of breast: Secondary | ICD-10-CM

## 2019-12-31 DIAGNOSIS — M7989 Other specified soft tissue disorders: Secondary | ICD-10-CM

## 2020-01-03 DIAGNOSIS — M545 Low back pain: Secondary | ICD-10-CM | POA: Diagnosis not present

## 2020-01-05 DIAGNOSIS — M545 Low back pain: Secondary | ICD-10-CM | POA: Diagnosis not present

## 2020-01-12 DIAGNOSIS — M545 Low back pain: Secondary | ICD-10-CM | POA: Diagnosis not present

## 2020-01-21 DIAGNOSIS — N1831 Chronic kidney disease, stage 3a: Secondary | ICD-10-CM | POA: Diagnosis not present

## 2020-01-21 DIAGNOSIS — H409 Unspecified glaucoma: Secondary | ICD-10-CM | POA: Diagnosis not present

## 2020-01-21 DIAGNOSIS — M1990 Unspecified osteoarthritis, unspecified site: Secondary | ICD-10-CM | POA: Diagnosis not present

## 2020-01-21 DIAGNOSIS — E782 Mixed hyperlipidemia: Secondary | ICD-10-CM | POA: Diagnosis not present

## 2020-01-21 DIAGNOSIS — D509 Iron deficiency anemia, unspecified: Secondary | ICD-10-CM | POA: Diagnosis not present

## 2020-01-21 DIAGNOSIS — N183 Chronic kidney disease, stage 3 unspecified: Secondary | ICD-10-CM | POA: Diagnosis not present

## 2020-01-21 DIAGNOSIS — I701 Atherosclerosis of renal artery: Secondary | ICD-10-CM | POA: Diagnosis not present

## 2020-01-21 DIAGNOSIS — I10 Essential (primary) hypertension: Secondary | ICD-10-CM | POA: Diagnosis not present

## 2020-03-06 DIAGNOSIS — H409 Unspecified glaucoma: Secondary | ICD-10-CM | POA: Diagnosis not present

## 2020-03-06 DIAGNOSIS — Z0000 Encounter for general adult medical examination without abnormal findings: Secondary | ICD-10-CM | POA: Diagnosis not present

## 2020-03-06 DIAGNOSIS — D509 Iron deficiency anemia, unspecified: Secondary | ICD-10-CM | POA: Diagnosis not present

## 2020-03-06 DIAGNOSIS — G250 Essential tremor: Secondary | ICD-10-CM | POA: Diagnosis not present

## 2020-03-06 DIAGNOSIS — E782 Mixed hyperlipidemia: Secondary | ICD-10-CM | POA: Diagnosis not present

## 2020-03-06 DIAGNOSIS — Z23 Encounter for immunization: Secondary | ICD-10-CM | POA: Diagnosis not present

## 2020-03-06 DIAGNOSIS — R7309 Other abnormal glucose: Secondary | ICD-10-CM | POA: Diagnosis not present

## 2020-03-06 DIAGNOSIS — N1831 Chronic kidney disease, stage 3a: Secondary | ICD-10-CM | POA: Diagnosis not present

## 2020-03-06 DIAGNOSIS — I10 Essential (primary) hypertension: Secondary | ICD-10-CM | POA: Diagnosis not present

## 2020-03-06 DIAGNOSIS — I739 Peripheral vascular disease, unspecified: Secondary | ICD-10-CM | POA: Diagnosis not present

## 2020-03-06 DIAGNOSIS — I701 Atherosclerosis of renal artery: Secondary | ICD-10-CM | POA: Diagnosis not present

## 2020-03-06 DIAGNOSIS — M1990 Unspecified osteoarthritis, unspecified site: Secondary | ICD-10-CM | POA: Diagnosis not present

## 2020-03-06 DIAGNOSIS — N183 Chronic kidney disease, stage 3 unspecified: Secondary | ICD-10-CM | POA: Diagnosis not present

## 2020-03-10 DIAGNOSIS — M71572 Other bursitis, not elsewhere classified, left ankle and foot: Secondary | ICD-10-CM | POA: Diagnosis not present

## 2020-03-20 DIAGNOSIS — I701 Atherosclerosis of renal artery: Secondary | ICD-10-CM | POA: Diagnosis not present

## 2020-03-20 DIAGNOSIS — I10 Essential (primary) hypertension: Secondary | ICD-10-CM | POA: Diagnosis not present

## 2020-03-20 DIAGNOSIS — E782 Mixed hyperlipidemia: Secondary | ICD-10-CM | POA: Diagnosis not present

## 2020-03-20 DIAGNOSIS — N1831 Chronic kidney disease, stage 3a: Secondary | ICD-10-CM | POA: Diagnosis not present

## 2020-03-20 DIAGNOSIS — H409 Unspecified glaucoma: Secondary | ICD-10-CM | POA: Diagnosis not present

## 2020-03-20 DIAGNOSIS — N183 Chronic kidney disease, stage 3 unspecified: Secondary | ICD-10-CM | POA: Diagnosis not present

## 2020-03-20 DIAGNOSIS — D509 Iron deficiency anemia, unspecified: Secondary | ICD-10-CM | POA: Diagnosis not present

## 2020-03-20 DIAGNOSIS — M1990 Unspecified osteoarthritis, unspecified site: Secondary | ICD-10-CM | POA: Diagnosis not present

## 2020-03-23 DIAGNOSIS — M722 Plantar fascial fibromatosis: Secondary | ICD-10-CM | POA: Diagnosis not present

## 2020-03-23 DIAGNOSIS — M7731 Calcaneal spur, right foot: Secondary | ICD-10-CM | POA: Diagnosis not present

## 2020-04-13 DIAGNOSIS — H409 Unspecified glaucoma: Secondary | ICD-10-CM | POA: Diagnosis not present

## 2020-04-13 DIAGNOSIS — N1831 Chronic kidney disease, stage 3a: Secondary | ICD-10-CM | POA: Diagnosis not present

## 2020-04-13 DIAGNOSIS — E782 Mixed hyperlipidemia: Secondary | ICD-10-CM | POA: Diagnosis not present

## 2020-04-13 DIAGNOSIS — I701 Atherosclerosis of renal artery: Secondary | ICD-10-CM | POA: Diagnosis not present

## 2020-04-13 DIAGNOSIS — N183 Chronic kidney disease, stage 3 unspecified: Secondary | ICD-10-CM | POA: Diagnosis not present

## 2020-04-13 DIAGNOSIS — M1990 Unspecified osteoarthritis, unspecified site: Secondary | ICD-10-CM | POA: Diagnosis not present

## 2020-04-13 DIAGNOSIS — D509 Iron deficiency anemia, unspecified: Secondary | ICD-10-CM | POA: Diagnosis not present

## 2020-04-13 DIAGNOSIS — I10 Essential (primary) hypertension: Secondary | ICD-10-CM | POA: Diagnosis not present

## 2020-04-24 DIAGNOSIS — M5136 Other intervertebral disc degeneration, lumbar region: Secondary | ICD-10-CM | POA: Diagnosis not present

## 2020-05-30 DIAGNOSIS — E782 Mixed hyperlipidemia: Secondary | ICD-10-CM | POA: Diagnosis not present

## 2020-05-30 DIAGNOSIS — I10 Essential (primary) hypertension: Secondary | ICD-10-CM | POA: Diagnosis not present

## 2020-05-30 DIAGNOSIS — I701 Atherosclerosis of renal artery: Secondary | ICD-10-CM | POA: Diagnosis not present

## 2020-05-30 DIAGNOSIS — D509 Iron deficiency anemia, unspecified: Secondary | ICD-10-CM | POA: Diagnosis not present

## 2020-05-30 DIAGNOSIS — N183 Chronic kidney disease, stage 3 unspecified: Secondary | ICD-10-CM | POA: Diagnosis not present

## 2020-05-30 DIAGNOSIS — N1831 Chronic kidney disease, stage 3a: Secondary | ICD-10-CM | POA: Diagnosis not present

## 2020-05-30 DIAGNOSIS — H409 Unspecified glaucoma: Secondary | ICD-10-CM | POA: Diagnosis not present

## 2020-05-30 DIAGNOSIS — M1990 Unspecified osteoarthritis, unspecified site: Secondary | ICD-10-CM | POA: Diagnosis not present

## 2020-06-07 ENCOUNTER — Other Ambulatory Visit: Payer: Self-pay | Admitting: Diagnostic Neuroimaging

## 2020-06-07 DIAGNOSIS — G250 Essential tremor: Secondary | ICD-10-CM

## 2020-06-21 ENCOUNTER — Ambulatory Visit: Payer: Medicare HMO | Admitting: Family Medicine

## 2020-06-21 ENCOUNTER — Encounter: Payer: Self-pay | Admitting: Family Medicine

## 2020-06-21 NOTE — Progress Notes (Deleted)
No chief complaint on file.    HISTORY OF PRESENT ILLNESS: Today 06/21/20  Jenna Howe is a 79 y.o. female here today for follow up for essential tremor. She continues primidone 25mg  BID.    HISTORY (copied from previous note)  UPDATE (06/21/19, VRP): Since last visit, doing well. Tremors are improved. No new issues. Tolerating primidone 25mg  twice a day.     UPDATE (03/26/17, VRP): Since last visit, doing well. Tolerating primidone 25mg  twice a day. No alleviating or aggravating factors.   UPDATE (03/25/2016, MM): Jenna Howe is a 79 year old female with a history of essential tremor. She returns today for follow-up. She is currently taking primidone 25 mg twice a day. She reports that this controls her tremor quite well. She is able to eat and dress herself without any difficulty. She states that stress is a trigger for her tremor. She feels that her tremor has improved. She denies any new neurological symptoms. She returns today for an evaluation.  UPDATE (03/27/15, MM): Jenna Howe is a 79 year old female with a history of essential tremor. She returns today for follow-up. She is currently taking primidone 25 mg twice a day. She reports that her tremor is under good control. She states that if she is under a lot of stress or in a rush it'll become more prevalent. The tremor primarily affects the left hand. She denies any new neurological symptoms. She returns today for an evaluation  PRIOR HPI (01/23/11, VRP): 79 year old right-handed female with hypertension, history of cervical laminectomy and discectomy, arthritis, bilateral total knee replacements, here for evaluation of tremor since Jan 2012.The patient reports gradual, progressive tremor of bilateral upper extremities (left greater than right), mainly when she is doing specific tasks such as applying makeup, putting on earrings, using a spoon or drinking from a glass. She denies any tremor at rest. She denies any new smell or  taste difficulties, sleep disturbances or walking problems. She denies any swallowing difficulties. She has a brother with Parkinson's disease. No other family history of tremor    REVIEW OF SYSTEMS: Out of a complete 14 system review of symptoms, the patient complains only of the following symptoms, and all other reviewed systems are negative.   ALLERGIES: Allergies  Allergen Reactions  . Clinoril [Sulindac]     Stomach upset  . Celebrex [Celecoxib] Rash    Stomach upset  . Penicillins Rash    Has patient had a PCN reaction causing immediate rash, facial/tongue/throat swelling, SOB or lightheadedness with hypotension:Yes Has patient had a PCN reaction causing severe rash involving mucus membranes or skin necrosis: No Has patient had a PCN reaction that required hospitalization No Has patient had a PCN reaction occurring within the last 10 years: No If all of the above answers are "NO", then may proceed with Cephalosporin use.   . Sulfa Antibiotics Rash     HOME MEDICATIONS: Outpatient Medications Prior to Visit  Medication Sig Dispense Refill  . AMLODIPINE BESYLATE PO Take 5 mg by mouth daily.    Marland Kitchen BIOTIN 5000 PO Take 5,000 mcg by mouth daily.     . bumetanide (BUMEX) 1 MG tablet Take 1 mg by mouth daily.     . Calcium Citrate-Vitamin D (CITRACAL + D PO) Take 1 tablet by mouth 2 (two) times daily.     . Cholecalciferol (VITAMIN D3) 1000 UNITS CAPS Take 1,000 Units by mouth daily.     . clindamycin (CLEOCIN) 150 MG capsule Take 4 capsules orally prior to dental  procedure.     . ferrous gluconate (FERGON) 324 MG tablet Take 324 mg by mouth daily.  6  . fish oil-omega-3 fatty acids 1000 MG capsule Take 1 g by mouth 2 (two) times daily.     Marland Kitchen latanoprost (XALATAN) 0.005 % ophthalmic solution Place 1 drop into both eyes at bedtime.    Marland Kitchen loratadine (CLARITIN) 10 MG tablet Take 10 mg by mouth daily.     . Multiple Vitamin (MULTIVITAMIN) tablet Take 1 tablet by mouth daily.    Marland Kitchen  oxyCODONE (OXY IR/ROXICODONE) 5 MG immediate release tablet Take 1-2 tablets (5-10 mg total) by mouth every 4 (four) hours as needed for severe pain. 84 tablet 0  . predniSONE (DELTASONE) 10 MG tablet Take 10 mg by mouth 2 (two) times daily with a meal.    . primidone (MYSOLINE) 50 MG tablet TAKE 1/2 TABLET BY MOUTH TWICE DAILY 90 tablet 2  . simvastatin (ZOCOR) 10 MG tablet Take 10 mg by mouth every evening.     . timolol (TIMOPTIC) 0.5 % ophthalmic solution Place 1 drop into both eyes daily.    Marland Kitchen tiZANidine (ZANAFLEX) 4 MG capsule Take 4 mg by mouth 3 (three) times daily. As needed    . traMADol (ULTRAM) 50 MG tablet Take 1 tablet (50 mg total) by mouth every 6 (six) hours as needed for moderate pain. 56 tablet 0  . vitamin C (ASCORBIC ACID) 500 MG tablet Take 500 mg by mouth daily.      No facility-administered medications prior to visit.     PAST MEDICAL HISTORY: Past Medical History:  Diagnosis Date  . Anemia   . Arthritis    arthritis in knees and hands . Right wrist and thumb splinted due to pain.  . Blood transfusion    10/12 after knee surgery   . Blood transfusion without reported diagnosis   . Essential tremor   . Glaucoma   . H/O seasonal allergies    sinus congestion  . Hx of adenomatous polyp of colon 08/18/2014  . Hypertension   . Tremor of both hands    mild , tx. Primidone     PAST SURGICAL HISTORY: Past Surgical History:  Procedure Laterality Date  . ABDOMINAL HYSTERECTOMY     Vaginal '88  . CERVICAL LAMINECTOMY  1995   cervical fusion"some limited ROM"  . CHOLECYSTECTOMY     1999  . COLONOSCOPY    . JOINT REPLACEMENT     right knee 10/12 , left knee 2006   . KNEE ARTHROTOMY Right 07/17/2016   Procedure: KNEE ARTHROTOMY with scar excision;  Surgeon: Gaynelle Arabian, MD;  Location: [facility];  Service: Orthopedics;  Laterality: Right;  spinal+block  . KNEE CLOSED REDUCTION  07/10/2011   Procedure: CLOSED MANIPULATION KNEE;  Surgeon: Gearlean Alf;  Location:  [facility];  Service: Orthopedics;  Laterality: Right;  . OTHER SURGICAL HISTORY     right knee replacement 8/11      FAMILY HISTORY: Family History  Problem Relation Age of Onset  . Stroke Mother   . Parkinsonism Mother   . Diabetes Mother   . Colon cancer Neg Hx   . Esophageal cancer Neg Hx   . Rectal cancer Neg Hx   . Stomach cancer Neg Hx      SOCIAL HISTORY: Social History   Socioeconomic History  . Marital status: Married    Spouse name: clifford  . Number of children: 1  . Years of education: MA  .  Highest education level: Not on file  Occupational History  . Occupation: retired  Tobacco Use  . Smoking status: Never Smoker  . Smokeless tobacco: Former Systems developer    Types: Secondary school teacher  . Vaping Use: Never used  Substance and Sexual Activity  . Alcohol use: No    Alcohol/week: 0.0 standard drinks  . Drug use: No  . Sexual activity: Not Currently  Other Topics Concern  . Not on file  Social History Narrative   Patient lives at home with her spouse.   Caffeine Use: 1 cup of coffee   Patient is right handed.    Social Determinants of Health   Financial Resource Strain: Not on file  Food Insecurity: Not on file  Transportation Needs: Not on file  Physical Activity: Not on file  Stress: Not on file  Social Connections: Not on file  Intimate Partner Violence: Not on file      PHYSICAL EXAM  There were no vitals filed for this visit. There is no height or weight on file to calculate BMI.   Generalized: Well developed, in no acute distress  Cardiology: normal rate and rhythm, no murmur auscultated  Respiratory: clear to auscultation bilaterally    Neurological examination  Mentation: Alert oriented to time, place, history taking. Follows all commands speech and language fluent Cranial nerve II-XII: Pupils were equal round reactive to light. Extraocular movements were full, visual field were full on confrontational test. Facial sensation and strength  were normal. Uvula tongue midline. Head turning and shoulder shrug  were normal and symmetric. Motor: The motor testing reveals 5 over 5 strength of all 4 extremities. Good symmetric motor tone is noted throughout.  Sensory: Sensory testing is intact to soft touch on all 4 extremities. No evidence of extinction is noted.  Coordination: Cerebellar testing reveals good finger-nose-finger and heel-to-shin bilaterally.  Gait and station: Gait is normal. Tandem gait is normal. Romberg is negative. No drift is seen.  Reflexes: Deep tendon reflexes are symmetric and normal bilaterally.     DIAGNOSTIC DATA (LABS, IMAGING, TESTING) - I reviewed patient records, labs, notes, testing and imaging myself where available.  Lab Results  Component Value Date   WBC 8.6 07/17/2016   HGB 8.5 (L) 07/17/2016   HCT 33.8 (L) 07/17/2016   MCV 71.3 (L) 07/17/2016   PLT 237 07/17/2016      Component Value Date/Time   NA 141 07/11/2016 1426   K 3.7 07/11/2016 1426   CL 101 07/11/2016 1426   CO2 32 07/11/2016 1426   GLUCOSE 116 (H) 07/11/2016 1426   BUN 15 07/11/2016 1426   CREATININE 0.98 07/17/2016 1923   CALCIUM 8.8 (L) 07/11/2016 1426   PROT 7.8 04/26/2011 1220   ALBUMIN 4.0 04/26/2011 1220   AST 23 04/26/2011 1220   ALT 15 04/26/2011 1220   ALKPHOS 75 04/26/2011 1220   BILITOT 0.2 (L) 04/26/2011 1220   GFRNONAA 55 (L) 07/17/2016 1923   GFRAA >60 07/17/2016 1923   No results found for: CHOL, HDL, LDLCALC, LDLDIRECT, TRIG, CHOLHDL No results found for: HGBA1C No results found for: VITAMINB12 No results found for: TSH  No flowsheet data found.   ASSESSMENT AND PLAN  79 y.o. year old female  has a past medical history of Anemia, Arthritis, Blood transfusion, Blood transfusion without reported diagnosis, Essential tremor, Glaucoma, H/O seasonal allergies, adenomatous polyp of colon (08/18/2014), Hypertension, and Tremor of both hands. here with   Essential tremor    No orders of  the defined  types were placed in this encounter.     I spent 20 minutes of face-to-face and non-face-to-face time with patient.  This included previsit chart review, lab review, study review, order entry, electronic health record documentation, patient education.    Debbora Presto, MSN, FNP-C 06/21/2020, 12:02 PM  [facility] [address], [place], [place] [address] [phone]

## 2020-07-03 DIAGNOSIS — I701 Atherosclerosis of renal artery: Secondary | ICD-10-CM | POA: Diagnosis not present

## 2020-07-03 DIAGNOSIS — H409 Unspecified glaucoma: Secondary | ICD-10-CM | POA: Diagnosis not present

## 2020-07-03 DIAGNOSIS — N183 Chronic kidney disease, stage 3 unspecified: Secondary | ICD-10-CM | POA: Diagnosis not present

## 2020-07-03 DIAGNOSIS — D509 Iron deficiency anemia, unspecified: Secondary | ICD-10-CM | POA: Diagnosis not present

## 2020-07-03 DIAGNOSIS — N1831 Chronic kidney disease, stage 3a: Secondary | ICD-10-CM | POA: Diagnosis not present

## 2020-07-03 DIAGNOSIS — I10 Essential (primary) hypertension: Secondary | ICD-10-CM | POA: Diagnosis not present

## 2020-07-03 DIAGNOSIS — M1990 Unspecified osteoarthritis, unspecified site: Secondary | ICD-10-CM | POA: Diagnosis not present

## 2020-07-03 DIAGNOSIS — E782 Mixed hyperlipidemia: Secondary | ICD-10-CM | POA: Diagnosis not present

## 2020-07-21 DIAGNOSIS — N183 Chronic kidney disease, stage 3 unspecified: Secondary | ICD-10-CM | POA: Diagnosis not present

## 2020-07-21 DIAGNOSIS — E782 Mixed hyperlipidemia: Secondary | ICD-10-CM | POA: Diagnosis not present

## 2020-07-21 DIAGNOSIS — N1831 Chronic kidney disease, stage 3a: Secondary | ICD-10-CM | POA: Diagnosis not present

## 2020-07-21 DIAGNOSIS — D509 Iron deficiency anemia, unspecified: Secondary | ICD-10-CM | POA: Diagnosis not present

## 2020-07-21 DIAGNOSIS — I701 Atherosclerosis of renal artery: Secondary | ICD-10-CM | POA: Diagnosis not present

## 2020-07-21 DIAGNOSIS — I10 Essential (primary) hypertension: Secondary | ICD-10-CM | POA: Diagnosis not present

## 2020-07-21 DIAGNOSIS — M1990 Unspecified osteoarthritis, unspecified site: Secondary | ICD-10-CM | POA: Diagnosis not present

## 2020-07-21 DIAGNOSIS — H409 Unspecified glaucoma: Secondary | ICD-10-CM | POA: Diagnosis not present

## 2020-08-09 DIAGNOSIS — L299 Pruritus, unspecified: Secondary | ICD-10-CM | POA: Diagnosis not present

## 2020-08-09 DIAGNOSIS — I10 Essential (primary) hypertension: Secondary | ICD-10-CM | POA: Diagnosis not present

## 2020-08-09 DIAGNOSIS — M549 Dorsalgia, unspecified: Secondary | ICD-10-CM | POA: Diagnosis not present

## 2020-08-10 DIAGNOSIS — N183 Chronic kidney disease, stage 3 unspecified: Secondary | ICD-10-CM | POA: Diagnosis not present

## 2020-08-10 DIAGNOSIS — D509 Iron deficiency anemia, unspecified: Secondary | ICD-10-CM | POA: Diagnosis not present

## 2020-08-10 DIAGNOSIS — M1990 Unspecified osteoarthritis, unspecified site: Secondary | ICD-10-CM | POA: Diagnosis not present

## 2020-08-10 DIAGNOSIS — N1831 Chronic kidney disease, stage 3a: Secondary | ICD-10-CM | POA: Diagnosis not present

## 2020-08-10 DIAGNOSIS — I10 Essential (primary) hypertension: Secondary | ICD-10-CM | POA: Diagnosis not present

## 2020-08-10 DIAGNOSIS — H409 Unspecified glaucoma: Secondary | ICD-10-CM | POA: Diagnosis not present

## 2020-08-10 DIAGNOSIS — E782 Mixed hyperlipidemia: Secondary | ICD-10-CM | POA: Diagnosis not present

## 2020-08-10 DIAGNOSIS — I701 Atherosclerosis of renal artery: Secondary | ICD-10-CM | POA: Diagnosis not present

## 2020-08-11 DIAGNOSIS — H2513 Age-related nuclear cataract, bilateral: Secondary | ICD-10-CM | POA: Diagnosis not present

## 2020-08-11 DIAGNOSIS — H401132 Primary open-angle glaucoma, bilateral, moderate stage: Secondary | ICD-10-CM | POA: Diagnosis not present

## 2020-08-11 DIAGNOSIS — H52203 Unspecified astigmatism, bilateral: Secondary | ICD-10-CM | POA: Diagnosis not present

## 2020-09-01 DIAGNOSIS — H401132 Primary open-angle glaucoma, bilateral, moderate stage: Secondary | ICD-10-CM | POA: Diagnosis not present

## 2020-09-08 DIAGNOSIS — I701 Atherosclerosis of renal artery: Secondary | ICD-10-CM | POA: Diagnosis not present

## 2020-09-08 DIAGNOSIS — I10 Essential (primary) hypertension: Secondary | ICD-10-CM | POA: Diagnosis not present

## 2020-09-08 DIAGNOSIS — N183 Chronic kidney disease, stage 3 unspecified: Secondary | ICD-10-CM | POA: Diagnosis not present

## 2020-09-08 DIAGNOSIS — R7303 Prediabetes: Secondary | ICD-10-CM | POA: Diagnosis not present

## 2020-09-08 DIAGNOSIS — Z23 Encounter for immunization: Secondary | ICD-10-CM | POA: Diagnosis not present

## 2020-09-08 DIAGNOSIS — E782 Mixed hyperlipidemia: Secondary | ICD-10-CM | POA: Diagnosis not present

## 2020-09-08 DIAGNOSIS — I739 Peripheral vascular disease, unspecified: Secondary | ICD-10-CM | POA: Diagnosis not present

## 2020-09-08 DIAGNOSIS — M5136 Other intervertebral disc degeneration, lumbar region: Secondary | ICD-10-CM | POA: Diagnosis not present

## 2020-09-08 DIAGNOSIS — G250 Essential tremor: Secondary | ICD-10-CM | POA: Diagnosis not present

## 2020-09-20 DIAGNOSIS — H409 Unspecified glaucoma: Secondary | ICD-10-CM | POA: Diagnosis not present

## 2020-09-20 DIAGNOSIS — D509 Iron deficiency anemia, unspecified: Secondary | ICD-10-CM | POA: Diagnosis not present

## 2020-09-20 DIAGNOSIS — E782 Mixed hyperlipidemia: Secondary | ICD-10-CM | POA: Diagnosis not present

## 2020-09-20 DIAGNOSIS — I701 Atherosclerosis of renal artery: Secondary | ICD-10-CM | POA: Diagnosis not present

## 2020-09-20 DIAGNOSIS — M1990 Unspecified osteoarthritis, unspecified site: Secondary | ICD-10-CM | POA: Diagnosis not present

## 2020-09-20 DIAGNOSIS — N183 Chronic kidney disease, stage 3 unspecified: Secondary | ICD-10-CM | POA: Diagnosis not present

## 2020-09-20 DIAGNOSIS — I10 Essential (primary) hypertension: Secondary | ICD-10-CM | POA: Diagnosis not present

## 2020-09-21 ENCOUNTER — Ambulatory Visit (INDEPENDENT_AMBULATORY_CARE_PROVIDER_SITE_OTHER): Payer: Medicare HMO | Admitting: Family Medicine

## 2020-09-21 ENCOUNTER — Encounter: Payer: Self-pay | Admitting: Family Medicine

## 2020-09-21 VITALS — BP 162/75 | HR 70 | Ht 62.0 in | Wt 148.0 lb

## 2020-09-21 DIAGNOSIS — G250 Essential tremor: Secondary | ICD-10-CM | POA: Diagnosis not present

## 2020-09-21 MED ORDER — PRIMIDONE 50 MG PO TABS: 25.0000 mg | ORAL_TABLET | Freq: Two times a day (BID) | ORAL | 4 refills | Status: DC

## 2020-09-21 NOTE — Patient Instructions (Addendum)
Below is our plan:  We will continue primidone 25mg  twice daily.   Please make sure you are staying well hydrated. I recommend 50-60 ounces daily. Well balanced diet and regular exercise encouraged. Consistent sleep schedule with 6-8 hours recommended.   Please continue follow up with care team as directed.   Follow up with me in 1 year, may follow up with PCP for refills if willing   You may receive a survey regarding today's visit. I encourage you to leave honest feed back as I do use this information to improve patient care. Thank you for seeing me today!      Essential Tremor A tremor is trembling or shaking that a person cannot control. Most tremors affect the hands or arms. Tremors can also affect the head, vocal cords, legs, and other parts of the body. Essential tremor is a tremor without a known cause. Usually, it occurs while a person is trying to perform an action. It tends to get worse gradually as a person ages. What are the causes? The cause of this condition is not known. What increases the risk? You are more likely to develop this condition if:  You have a family member with essential tremor.  You are age 90 or older.  You take certain medicines. What are the signs or symptoms? The main sign of a tremor is a rhythmic shaking of certain parts of your body that is uncontrolled and unintentional. You may:  Have difficulty eating with a spoon or fork.  Have difficulty writing.  Nod your head up and down or side to side.  Have a quivering voice. The shaking may:  Get worse over time.  Come and go.  Be more noticeable on one side of your body.  Get worse due to stress, fatigue, caffeine, and extreme heat or cold. How is this diagnosed? This condition may be diagnosed based on:  Your symptoms and medical history.  A physical exam. There is no single test to diagnose an essential tremor. However, your health care provider may order tests to rule out other  causes of your condition. These may include:  Blood and urine tests.  Imaging studies of your brain, such as CT scan and MRI.  A test that measures involuntary muscle movement (electromyogram).   How is this treated? Treatment for essential tremor depends on the severity of the condition.  Some tremors may go away without treatment.  Mild tremors may not need treatment if they do not affect your day-to-day life.  Severe tremors may need to be treated using one or more of the following options: ? Medicines. ? Lifestyle changes. ? Occupational or physical therapy. Follow these instructions at home: Lifestyle  Do not use any products that contain nicotine or tobacco, such as cigarettes and e-cigarettes. If you need help quitting, ask your health care provider.  Limit your caffeine intake as told by your health care provider.  Try to get 8 hours of sleep each night.  Find ways to manage your stress that fits your lifestyle and personality. Consider trying meditation or yoga.  Try to anticipate stressful situations and allow extra time to manage them.  If you are struggling emotionally with the effects of your tremor, consider working with a mental health provider.   General instructions  Take over-the-counter and prescription medicines only as told by your health care provider.  Avoid extreme heat and extreme cold.  Keep all follow-up visits as told by your health care provider. This is  important. Visits may include physical therapy visits. Contact a health care provider if:  You experience any changes in the location or intensity of your tremors.  You start having a tremor after starting a new medicine.  You have tremor with other symptoms, such as: ? Numbness. ? Tingling. ? Pain. ? Weakness.  Your tremor gets worse.  Your tremor interferes with your daily life.  You feel down, blue, or sad for at least 2 weeks in a row.  Worrying about your tremor and what other  people think about you interferes with your everyday life functions, including relationships, work, or school. Summary  Essential tremor is a tremor without a known cause. Usually, it occurs when you are trying to perform an action.  You are more likely to develop this condition if you have a family member with essential tremor.  The main sign of a tremor is a rhythmic shaking of certain parts of your body that is uncontrolled and unintentional.  Treatment for essential tremor depends on the severity of the condition. This information is not intended to replace advice given to you by your health care provider. Make sure you discuss any questions you have with your health care provider. Document Revised: 03/17/2020 Document Reviewed: 03/17/2020 Elsevier Patient Education  2021 [employer].

## 2020-09-21 NOTE — Progress Notes (Signed)
Chief Complaint  Patient presents with  . Follow-up    RM 2 alone Pt is well, she thinks tremors have improved some.     HISTORY OF PRESENT ILLNESS: 09/21/20 ALL:  Jenna Howe is a 80 y.o. female here today for follow up for tremors. She continues primidone 25mg  twice daily and tolerating well. Tremor is well managed. She notices it most in left hand when anxious or in a hurry. No resting tremor. No difficulty swallowing, She has been having back pain and right sided leg pain. She is followed closely by PCP. She reports BP checked every other day at home and usually 110-130/70's.   HISTORY (copied from Dr Jenna Howe previous note)  UPDATE (06/21/19, VRP): Since last visit, doing well. Tremors are improved. No new issues. Tolerating primidone 25mg  twice a day.     UPDATE (03/26/17, VRP): Since last visit, doing well. Tolerating primidone 25mg  twice a day. No alleviating or aggravating factors.   UPDATE (03/25/2016, MM): Jenna Howe is a 80 year old female with a history of essential tremor. She returns today for follow-up. She is currently taking primidone 25 mg twice a day. She reports that this controls her tremor quite well. She is able to eat and dress herself without any difficulty. She states that stress is a trigger for her tremor. She feels that her tremor has improved. She denies any new neurological symptoms. She returns today for an evaluation.  UPDATE (03/27/15, MM): Jenna Howe is a 80 year old female with a history of essential tremor. She returns today for follow-up. She is currently taking primidone 25 mg twice a day. She reports that her tremor is under good control. She states that if she is under a lot of stress or in a rush it'll become more prevalent. The tremor primarily affects the left hand. She denies any new neurological symptoms. She returns today for an evaluation  PRIOR HPI (01/23/11, VRP): 80 year old right-handed female with hypertension, history of  cervical laminectomy and discectomy, arthritis, bilateral total knee replacements, here for evaluation of tremor since Jan 2012.The patient reports gradual, progressive tremor of bilateral upper extremities (left greater than right), mainly when she is doing specific tasks such as applying makeup, putting on earrings, using a spoon or drinking from a glass. She denies any tremor at rest. She denies any new smell or taste difficulties, sleep disturbances or walking problems. She denies any swallowing difficulties. She has a brother with Parkinson's disease. No other family history of tremor   REVIEW OF SYSTEMS: Out of a complete 14 system review of symptoms, the patient complains only of the following symptoms, tremor, back pain, leg swelling, and all other reviewed systems are negative.   ALLERGIES: Allergies  Allergen Reactions  . Clinoril [Sulindac]     Stomach upset  . Celebrex [Celecoxib] Rash    Stomach upset  . Penicillins Rash    Has patient had a PCN reaction causing immediate rash, facial/tongue/throat swelling, SOB or lightheadedness with hypotension:Yes Has patient had a PCN reaction causing severe rash involving mucus membranes or skin necrosis: No Has patient had a PCN reaction that required hospitalization No Has patient had a PCN reaction occurring within the last 10 years: No If all of the above answers are "NO", then may proceed with Cephalosporin use.   . Sulfa Antibiotics Rash     HOME MEDICATIONS: Outpatient Medications Prior to Visit  Medication Sig Dispense Refill  . AMLODIPINE BESYLATE PO Take 5 mg by mouth daily.    Marland Kitchen  BIOTIN 5000 PO Take 5,000 mcg by mouth daily.     . bumetanide (BUMEX) 1 MG tablet Take 1 mg by mouth daily.    . Calcium Citrate-Vitamin D (CITRACAL + D PO) Take 1 tablet by mouth 2 (two) times daily.    . Cholecalciferol (VITAMIN D3) 1000 UNITS CAPS Take 1,000 Units by mouth daily.     . clindamycin (CLEOCIN) 150 MG capsule Take 4 capsules  orally prior to dental procedure.    . ferrous gluconate (FERGON) 324 MG tablet Take 324 mg by mouth daily.  6  . fish oil-omega-3 fatty acids 1000 MG capsule Take 1 g by mouth 2 (two) times daily.     Marland Kitchen latanoprost (XALATAN) 0.005 % ophthalmic solution Place 1 drop into both eyes at bedtime.    Marland Kitchen loratadine (CLARITIN) 10 MG tablet Take 10 mg by mouth daily.    . Multiple Vitamin (MULTIVITAMIN) tablet Take 1 tablet by mouth daily.    Marland Kitchen oxyCODONE (OXY IR/ROXICODONE) 5 MG immediate release tablet Take 1-2 tablets (5-10 mg total) by mouth every 4 (four) hours as needed for severe pain. 84 tablet 0  . simvastatin (ZOCOR) 10 MG tablet Take 10 mg by mouth every evening.     . timolol (TIMOPTIC) 0.5 % ophthalmic solution Place 1 drop into both eyes daily.    Marland Kitchen tiZANidine (ZANAFLEX) 4 MG capsule Take 4 mg by mouth 3 (three) times daily. As needed    . traMADol (ULTRAM) 50 MG tablet Take 1 tablet (50 mg total) by mouth every 6 (six) hours as needed for moderate pain. 56 tablet 0  . vitamin C (ASCORBIC ACID) 500 MG tablet Take 500 mg by mouth daily.    . predniSONE (DELTASONE) 10 MG tablet Take 10 mg by mouth 2 (two) times daily with a meal.    . primidone (MYSOLINE) 50 MG tablet TAKE 1/2 TABLET BY MOUTH TWICE DAILY 90 tablet 2   No facility-administered medications prior to visit.     PAST MEDICAL HISTORY: Past Medical History:  Diagnosis Date  . Anemia   . Arthritis    arthritis in knees and hands . Right wrist and thumb splinted due to pain.  . Blood transfusion    10/12 after knee surgery   . Blood transfusion without reported diagnosis   . Essential tremor   . Glaucoma   . H/O seasonal allergies    sinus congestion  . Hx of adenomatous polyp of colon 08/18/2014  . Hypertension   . Tremor of both hands    mild , tx. Primidone     PAST SURGICAL HISTORY: Past Surgical History:  Procedure Laterality Date  . ABDOMINAL HYSTERECTOMY     Vaginal '88  . CERVICAL LAMINECTOMY  1995    cervical fusion"some limited ROM"  . CHOLECYSTECTOMY     1999  . COLONOSCOPY    . JOINT REPLACEMENT     right knee 10/12 , left knee 2006   . KNEE ARTHROTOMY Right 07/17/2016   Procedure: KNEE ARTHROTOMY with scar excision;  Surgeon: Gaynelle Arabian, MD;  Location: [facility];  Service: Orthopedics;  Laterality: Right;  spinal+block  . KNEE CLOSED REDUCTION  07/10/2011   Procedure: CLOSED MANIPULATION KNEE;  Surgeon: Gearlean Alf;  Location: [facility];  Service: Orthopedics;  Laterality: Right;  . OTHER SURGICAL HISTORY     right knee replacement 8/11      FAMILY HISTORY: Family History  Problem Relation Age of Onset  . Stroke Mother   .  Parkinsonism Mother   . Diabetes Mother   . Colon cancer Neg Hx   . Esophageal cancer Neg Hx   . Rectal cancer Neg Hx   . Stomach cancer Neg Hx      SOCIAL HISTORY: Social History   Socioeconomic History  . Marital status: Married    Spouse name: clifford  . Number of children: 1  . Years of education: MA  . Highest education level: Not on file  Occupational History  . Occupation: retired  Tobacco Use  . Smoking status: Never Smoker  . Smokeless tobacco: Former Systems developer    Types: Secondary school teacher  . Vaping Use: Never used  Substance and Sexual Activity  . Alcohol use: No    Alcohol/week: 0.0 standard drinks  . Drug use: No  . Sexual activity: Not Currently  Other Topics Concern  . Not on file  Social History Narrative   Patient lives at home with her spouse.   Caffeine Use: 1 cup of coffee   Patient is right handed.    Social Determinants of Health   Financial Resource Strain: Not on file  Food Insecurity: Not on file  Transportation Needs: Not on file  Physical Activity: Not on file  Stress: Not on file  Social Connections: Not on file  Intimate Partner Violence: Not on file      PHYSICAL EXAM  Vitals:   09/21/20 1351  BP: (!) 162/75  Pulse: 70  Weight: 148 lb (67.1 kg)  Height: 5\' 2"  (1.575 m)   Body mass index is  27.07 kg/m.   Generalized: Well developed, in no acute distress  Cardiology: normal rate and rhythm, no murmur auscultated  Respiratory: clear to auscultation bilaterally    Neurological examination  Mentation: Alert oriented to time, place, history taking. Follows all commands speech and language fluent Cranial nerve II-XII: Pupils were equal round reactive to light. Extraocular movements were full, visual field were full on confrontational test. Facial sensation and strength were normal. Head turning and shoulder shrug  were normal and symmetric. Motor: The motor testing reveals 5 over 5 strength of all 4 extremities. Good symmetric motor tone is noted throughout.  Sensory: Sensory testing is intact to soft touch on all 4 extremities. No evidence of extinction is noted.  Coordination: Cerebellar testing reveals good finger-nose-finger and heel-to-shin bilaterally.  Gait and station: Gait is normal. Tandem gait is normal. Romberg is negative. No drift is seen.  Reflexes: Deep tendon reflexes are symmetric and normal bilaterally.     DIAGNOSTIC DATA (LABS, IMAGING, TESTING) - I reviewed patient records, labs, notes, testing and imaging myself where available.  Lab Results  Component Value Date   WBC 8.6 07/17/2016   HGB 8.5 (L) 07/17/2016   HCT 33.8 (L) 07/17/2016   MCV 71.3 (L) 07/17/2016   PLT 237 07/17/2016      Component Value Date/Time   NA 141 07/11/2016 1426   K 3.7 07/11/2016 1426   CL 101 07/11/2016 1426   CO2 32 07/11/2016 1426   GLUCOSE 116 (H) 07/11/2016 1426   BUN 15 07/11/2016 1426   CREATININE 0.98 07/17/2016 1923   CALCIUM 8.8 (L) 07/11/2016 1426   PROT 7.8 04/26/2011 1220   ALBUMIN 4.0 04/26/2011 1220   AST 23 04/26/2011 1220   ALT 15 04/26/2011 1220   ALKPHOS 75 04/26/2011 1220   BILITOT 0.2 (L) 04/26/2011 1220   GFRNONAA 55 (L) 07/17/2016 1923   GFRAA >60 07/17/2016 1923   No results found  for: CHOL, HDL, LDLCALC, LDLDIRECT, TRIG, CHOLHDL No  results found for: HGBA1C No results found for: VITAMINB12 No results found for: TSH  No flowsheet data found.   No flowsheet data found.   ASSESSMENT AND PLAN  80 y.o. year old female  has a past medical history of Anemia, Arthritis, Blood transfusion, Blood transfusion without reported diagnosis, Essential tremor, Glaucoma, H/O seasonal allergies, adenomatous polyp of colon (08/18/2014), Hypertension, and Tremor of both hands. here with   Essential tremor - Plan: primidone (MYSOLINE) 50 MG tablet  Jailen is doing well, today. She will continue primidone 25mg  twice daily. She will continue close follow up with Dr Lysle Rubens for comorbidity management and labs. Healthy lifestyle habits encouraged. She will follow up in 1 year or may follow up with PCP for refills and see [place] as needed. She is aware of and understands plan.   No orders of the defined types were placed in this encounter.    Meds ordered this encounter  Medications  . primidone (MYSOLINE) 50 MG tablet    Sig: Take 0.5 tablets (25 mg total) by mouth 2 (two) times daily.    Dispense:  90 tablet    Refill:  4    This prescription was filled on 03/19/2020. Any refills authorized will be placed on file.    Order Specific Question:   Supervising Provider    Answer:   Melvenia Beam [[id number]]      I spent 20 minutes of face-to-face and non-face-to-face time with patient.  This included previsit chart review, lab review, study review, order entry, electronic health record documentation, patient education.    Debbora Presto, MSN, FNP-C 09/21/2020, 4:04 PM  [facility] [address], [place], [place] [address] [phone]

## 2020-09-25 NOTE — Progress Notes (Signed)
I reviewed note and agree with plan.   Penni Bombard, MD 02/23/5908, 3:11 PM Certified in Neurology, Neurophysiology and Neuroimaging  [facility] [address] [place], [place] [address] [phone]

## 2020-09-29 DIAGNOSIS — M4696 Unspecified inflammatory spondylopathy, lumbar region: Secondary | ICD-10-CM | POA: Diagnosis not present

## 2020-09-29 DIAGNOSIS — M545 Low back pain, unspecified: Secondary | ICD-10-CM | POA: Diagnosis not present

## 2020-10-04 DIAGNOSIS — I10 Essential (primary) hypertension: Secondary | ICD-10-CM | POA: Diagnosis not present

## 2020-10-04 DIAGNOSIS — M5136 Other intervertebral disc degeneration, lumbar region: Secondary | ICD-10-CM | POA: Diagnosis not present

## 2020-10-16 DIAGNOSIS — M47816 Spondylosis without myelopathy or radiculopathy, lumbar region: Secondary | ICD-10-CM | POA: Diagnosis not present

## 2020-10-27 ENCOUNTER — Encounter: Payer: Self-pay | Admitting: Podiatry

## 2020-10-27 ENCOUNTER — Other Ambulatory Visit: Payer: Self-pay

## 2020-10-27 ENCOUNTER — Ambulatory Visit (INDEPENDENT_AMBULATORY_CARE_PROVIDER_SITE_OTHER): Payer: Medicare HMO | Admitting: Podiatry

## 2020-10-27 DIAGNOSIS — L84 Corns and callosities: Secondary | ICD-10-CM | POA: Insufficient documentation

## 2020-10-27 DIAGNOSIS — M545 Low back pain, unspecified: Secondary | ICD-10-CM | POA: Insufficient documentation

## 2020-10-27 DIAGNOSIS — R7303 Prediabetes: Secondary | ICD-10-CM | POA: Insufficient documentation

## 2020-10-27 DIAGNOSIS — H409 Unspecified glaucoma: Secondary | ICD-10-CM | POA: Insufficient documentation

## 2020-10-27 DIAGNOSIS — E782 Mixed hyperlipidemia: Secondary | ICD-10-CM | POA: Insufficient documentation

## 2020-10-27 DIAGNOSIS — J309 Allergic rhinitis, unspecified: Secondary | ICD-10-CM | POA: Insufficient documentation

## 2020-10-27 DIAGNOSIS — I701 Atherosclerosis of renal artery: Secondary | ICD-10-CM | POA: Insufficient documentation

## 2020-10-27 DIAGNOSIS — E7800 Pure hypercholesterolemia, unspecified: Secondary | ICD-10-CM | POA: Insufficient documentation

## 2020-10-27 DIAGNOSIS — N183 Chronic kidney disease, stage 3 unspecified: Secondary | ICD-10-CM | POA: Diagnosis not present

## 2020-10-27 DIAGNOSIS — D509 Iron deficiency anemia, unspecified: Secondary | ICD-10-CM | POA: Insufficient documentation

## 2020-10-27 DIAGNOSIS — I10 Essential (primary) hypertension: Secondary | ICD-10-CM | POA: Insufficient documentation

## 2020-10-27 DIAGNOSIS — M5090 Cervical disc disorder, unspecified, unspecified cervical region: Secondary | ICD-10-CM | POA: Insufficient documentation

## 2020-10-27 NOTE — Progress Notes (Signed)
This patient presents to the office concerned about painful black  callus on the inside of her right heel. She denies any injury or trauma to her right heel.  She also has painful corn between her 4th and 5th toe left foot. This is painful walking and wearing her shoes.  She presents to the office for evaluation and treatment.    Vascular  Dorsalis pedis and posterior tibial pulses are weakly  palpable  B/L.  Capillary return  WNL.  Temperature gradient is  WNL.  Skin turgor  WNL  Sensorium  Senn Weinstein monofilament wire  WNL. Normal tactile sensation.  Nail Exam  Patient has normal nails with no evidence of bacterial or fungal infection.  Orthopedic  Exam  Muscle tone and muscle strength  WNL.  No limitations of motion feet  B/L.  No crepitus or joint effusion noted.  Foot type is unremarkable and digits show no abnormalities.  Bony prominences are unremarkable.  Skin  No open lesions.  Normal skin texture and turgor.  Corn between 4/5 digits left foot.  Hemorrhagic callus right heel.    Hemorrhagic callus right heel.  Cirb 4/5  Left.  IE>  Padding dispensed for separation 4/5 digits left foot.  Debride callus with dremel tool.  Told her this callus formed from pressure and told her to rest her foot on pillows or off the bed during sleep.   Gardiner Barefoot DPM

## 2020-11-08 DIAGNOSIS — M47816 Spondylosis without myelopathy or radiculopathy, lumbar region: Secondary | ICD-10-CM | POA: Diagnosis not present

## 2020-11-21 DIAGNOSIS — L668 Other cicatricial alopecia: Secondary | ICD-10-CM | POA: Diagnosis not present

## 2020-11-29 ENCOUNTER — Other Ambulatory Visit: Payer: Self-pay | Admitting: Family Medicine

## 2020-11-29 ENCOUNTER — Other Ambulatory Visit: Payer: Self-pay | Admitting: Internal Medicine

## 2020-11-29 DIAGNOSIS — Z1231 Encounter for screening mammogram for malignant neoplasm of breast: Secondary | ICD-10-CM

## 2020-12-01 DIAGNOSIS — M47816 Spondylosis without myelopathy or radiculopathy, lumbar region: Secondary | ICD-10-CM | POA: Diagnosis not present

## 2020-12-05 DIAGNOSIS — I701 Atherosclerosis of renal artery: Secondary | ICD-10-CM | POA: Diagnosis not present

## 2020-12-05 DIAGNOSIS — M1990 Unspecified osteoarthritis, unspecified site: Secondary | ICD-10-CM | POA: Diagnosis not present

## 2020-12-05 DIAGNOSIS — D509 Iron deficiency anemia, unspecified: Secondary | ICD-10-CM | POA: Diagnosis not present

## 2020-12-05 DIAGNOSIS — H409 Unspecified glaucoma: Secondary | ICD-10-CM | POA: Diagnosis not present

## 2020-12-05 DIAGNOSIS — N1831 Chronic kidney disease, stage 3a: Secondary | ICD-10-CM | POA: Diagnosis not present

## 2020-12-05 DIAGNOSIS — E782 Mixed hyperlipidemia: Secondary | ICD-10-CM | POA: Diagnosis not present

## 2020-12-05 DIAGNOSIS — I10 Essential (primary) hypertension: Secondary | ICD-10-CM | POA: Diagnosis not present

## 2020-12-20 ENCOUNTER — Other Ambulatory Visit: Payer: Self-pay | Admitting: Physical Medicine and Rehabilitation

## 2020-12-20 DIAGNOSIS — M5136 Other intervertebral disc degeneration, lumbar region: Secondary | ICD-10-CM

## 2020-12-22 DIAGNOSIS — M47816 Spondylosis without myelopathy or radiculopathy, lumbar region: Secondary | ICD-10-CM | POA: Diagnosis not present

## 2020-12-24 ENCOUNTER — Ambulatory Visit
Admission: RE | Admit: 2020-12-24 | Discharge: 2020-12-24 | Disposition: A | Discharge status: Home / Self Care | Payer: Medicare HMO | Source: Ambulatory Visit | Attending: Physical Medicine and Rehabilitation | Admitting: Physical Medicine and Rehabilitation

## 2020-12-24 ENCOUNTER — Other Ambulatory Visit: Payer: Self-pay

## 2020-12-24 DIAGNOSIS — M5136 Other intervertebral disc degeneration, lumbar region: Secondary | ICD-10-CM

## 2020-12-24 DIAGNOSIS — M545 Low back pain, unspecified: Secondary | ICD-10-CM | POA: Diagnosis not present

## 2020-12-24 DIAGNOSIS — M48061 Spinal stenosis, lumbar region without neurogenic claudication: Secondary | ICD-10-CM | POA: Diagnosis not present

## 2020-12-29 DIAGNOSIS — M47816 Spondylosis without myelopathy or radiculopathy, lumbar region: Secondary | ICD-10-CM | POA: Diagnosis not present

## 2021-01-09 DIAGNOSIS — M47816 Spondylosis without myelopathy or radiculopathy, lumbar region: Secondary | ICD-10-CM | POA: Diagnosis not present

## 2021-01-26 ENCOUNTER — Ambulatory Visit
Admission: RE | Admit: 2021-01-26 | Discharge: 2021-01-26 | Disposition: A | Discharge status: Home / Self Care | Payer: BC Managed Care – PPO | Source: Ambulatory Visit | Attending: Internal Medicine | Admitting: Internal Medicine

## 2021-01-26 ENCOUNTER — Other Ambulatory Visit: Payer: Self-pay

## 2021-01-26 DIAGNOSIS — Z1231 Encounter for screening mammogram for malignant neoplasm of breast: Secondary | ICD-10-CM | POA: Diagnosis not present

## 2021-01-29 DIAGNOSIS — M47816 Spondylosis without myelopathy or radiculopathy, lumbar region: Secondary | ICD-10-CM | POA: Diagnosis not present

## 2021-02-21 DIAGNOSIS — L668 Other cicatricial alopecia: Secondary | ICD-10-CM | POA: Diagnosis not present

## 2021-03-05 DIAGNOSIS — N1831 Chronic kidney disease, stage 3a: Secondary | ICD-10-CM | POA: Diagnosis not present

## 2021-03-05 DIAGNOSIS — D509 Iron deficiency anemia, unspecified: Secondary | ICD-10-CM | POA: Diagnosis not present

## 2021-03-05 DIAGNOSIS — I10 Essential (primary) hypertension: Secondary | ICD-10-CM | POA: Diagnosis not present

## 2021-03-05 DIAGNOSIS — M1990 Unspecified osteoarthritis, unspecified site: Secondary | ICD-10-CM | POA: Diagnosis not present

## 2021-03-05 DIAGNOSIS — N183 Chronic kidney disease, stage 3 unspecified: Secondary | ICD-10-CM | POA: Diagnosis not present

## 2021-03-05 DIAGNOSIS — I701 Atherosclerosis of renal artery: Secondary | ICD-10-CM | POA: Diagnosis not present

## 2021-03-05 DIAGNOSIS — E782 Mixed hyperlipidemia: Secondary | ICD-10-CM | POA: Diagnosis not present

## 2021-03-05 DIAGNOSIS — H409 Unspecified glaucoma: Secondary | ICD-10-CM | POA: Diagnosis not present

## 2021-03-06 DIAGNOSIS — H401132 Primary open-angle glaucoma, bilateral, moderate stage: Secondary | ICD-10-CM | POA: Diagnosis not present

## 2021-03-06 DIAGNOSIS — H2513 Age-related nuclear cataract, bilateral: Secondary | ICD-10-CM | POA: Diagnosis not present

## 2021-03-08 DIAGNOSIS — M47816 Spondylosis without myelopathy or radiculopathy, lumbar region: Secondary | ICD-10-CM | POA: Diagnosis not present

## 2021-03-28 DIAGNOSIS — G250 Essential tremor: Secondary | ICD-10-CM | POA: Diagnosis not present

## 2021-03-28 DIAGNOSIS — I739 Peripheral vascular disease, unspecified: Secondary | ICD-10-CM | POA: Diagnosis not present

## 2021-03-28 DIAGNOSIS — I701 Atherosclerosis of renal artery: Secondary | ICD-10-CM | POA: Diagnosis not present

## 2021-03-28 DIAGNOSIS — H409 Unspecified glaucoma: Secondary | ICD-10-CM | POA: Diagnosis not present

## 2021-03-28 DIAGNOSIS — E782 Mixed hyperlipidemia: Secondary | ICD-10-CM | POA: Diagnosis not present

## 2021-03-28 DIAGNOSIS — R7303 Prediabetes: Secondary | ICD-10-CM | POA: Diagnosis not present

## 2021-03-28 DIAGNOSIS — I10 Essential (primary) hypertension: Secondary | ICD-10-CM | POA: Diagnosis not present

## 2021-03-28 DIAGNOSIS — M1990 Unspecified osteoarthritis, unspecified site: Secondary | ICD-10-CM | POA: Diagnosis not present

## 2021-03-28 DIAGNOSIS — N1831 Chronic kidney disease, stage 3a: Secondary | ICD-10-CM | POA: Diagnosis not present

## 2021-03-28 DIAGNOSIS — L659 Nonscarring hair loss, unspecified: Secondary | ICD-10-CM | POA: Diagnosis not present

## 2021-03-29 ENCOUNTER — Encounter: Payer: Self-pay | Admitting: *Deleted

## 2021-03-29 NOTE — Progress Notes (Signed)
Referral received, under MD review for scheduling

## 2021-03-30 DIAGNOSIS — M47816 Spondylosis without myelopathy or radiculopathy, lumbar region: Secondary | ICD-10-CM | POA: Diagnosis not present

## 2021-04-02 ENCOUNTER — Other Ambulatory Visit: Payer: Self-pay | Admitting: *Deleted

## 2021-04-02 DIAGNOSIS — D509 Iron deficiency anemia, unspecified: Secondary | ICD-10-CM

## 2021-04-02 NOTE — Progress Notes (Signed)
New patient appt made for 10/24 with labs on 10/21

## 2021-04-15 IMAGING — MG DIGITAL SCREENING BILAT W/ TOMO W/ CAD
8 series · 8 of 24 positions shown · non-contrast
Comparison: Previous exam(s).

CLINICAL DATA: Screening.

EXAM:
DIGITAL SCREENING BILATERAL MAMMOGRAM WITH TOMO AND CAD

[L CC synth-2D]
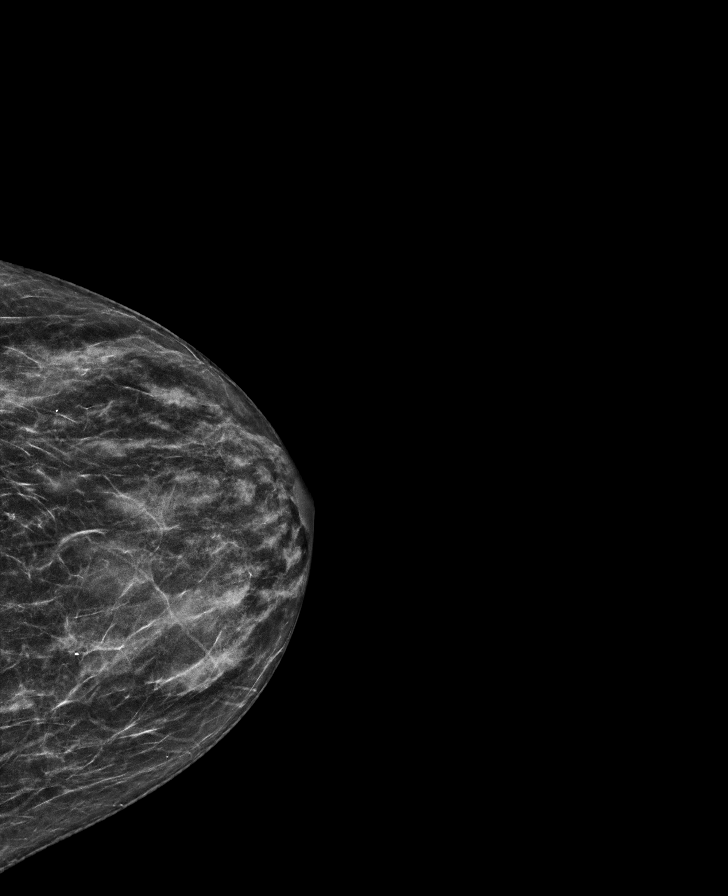

[R CC synth-2D]
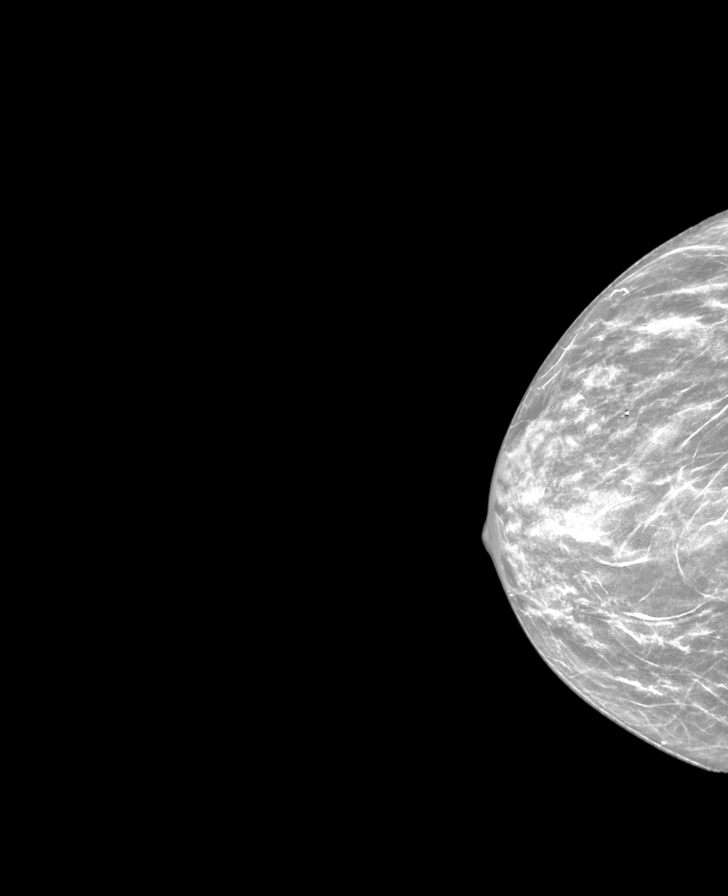

[L MLO synth-2D]
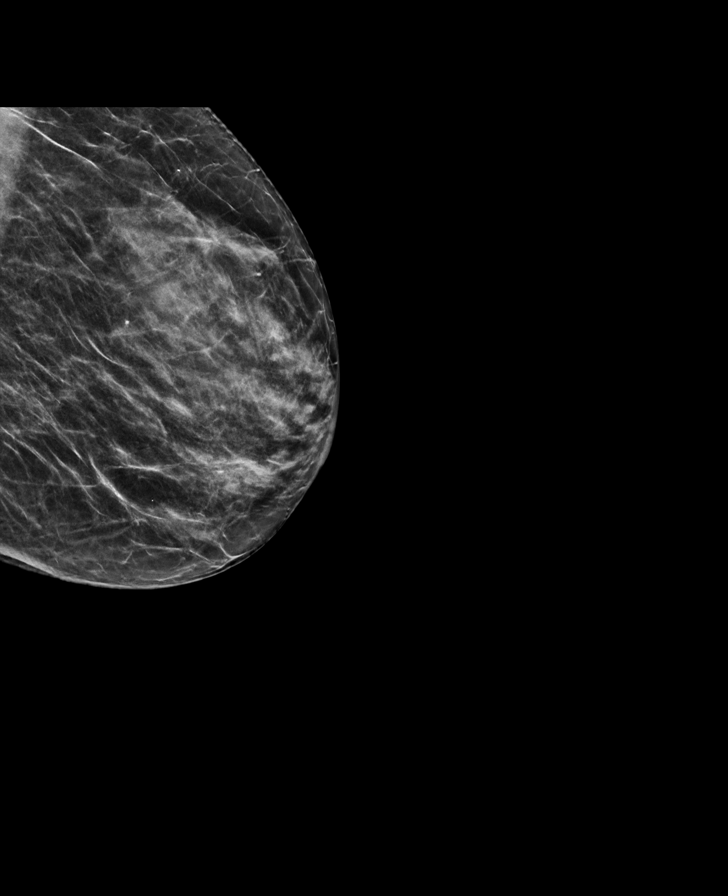

[R MLO synth-2D]
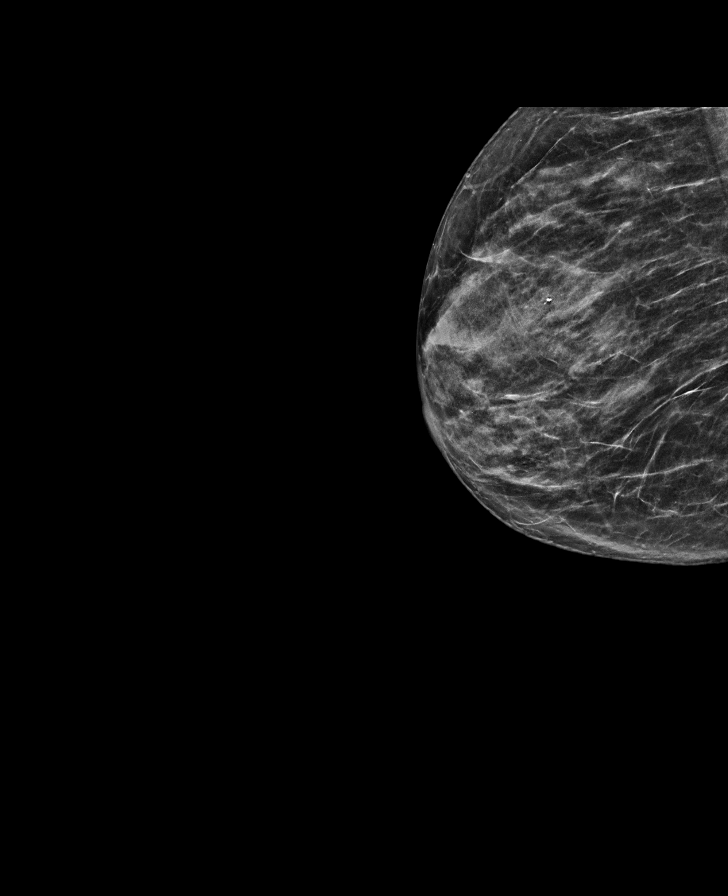

[L CC tomo · tomo slice 30/59.0]
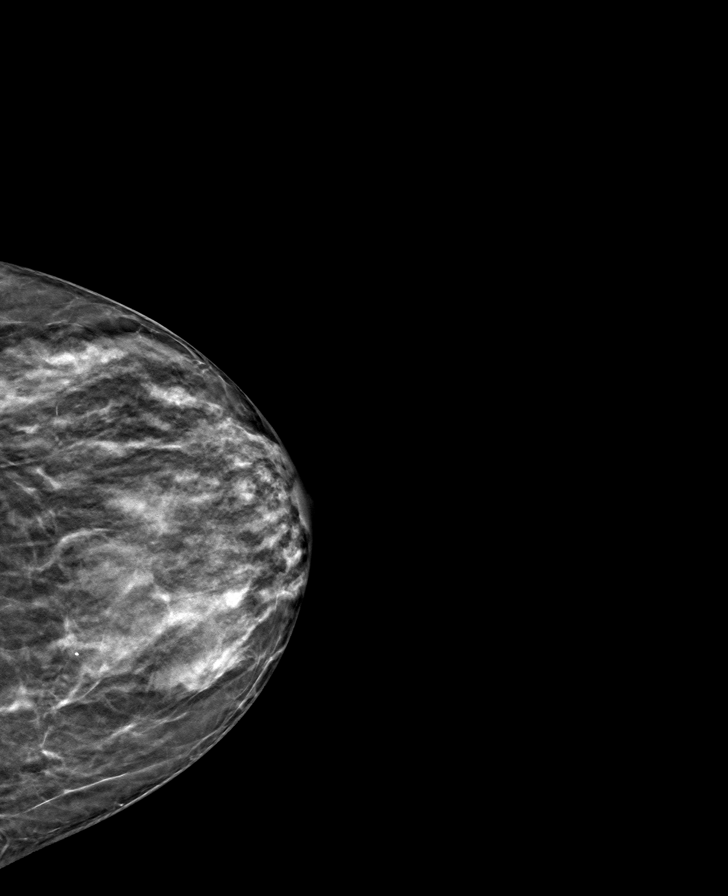

[L MLO tomo · tomo slice 31/62.0]
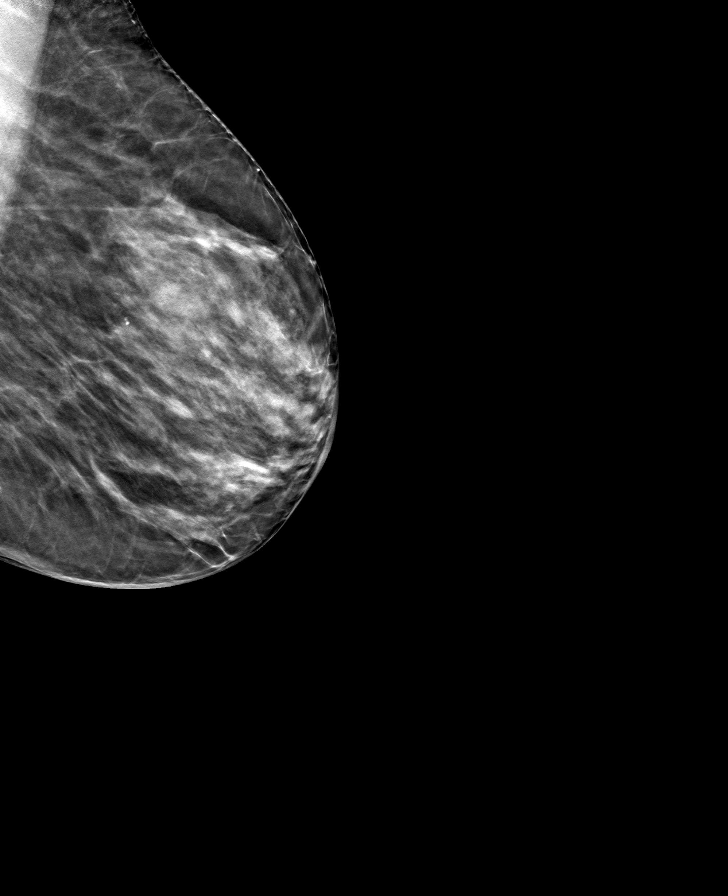

[R MLO tomo · tomo slice 28/55.0]
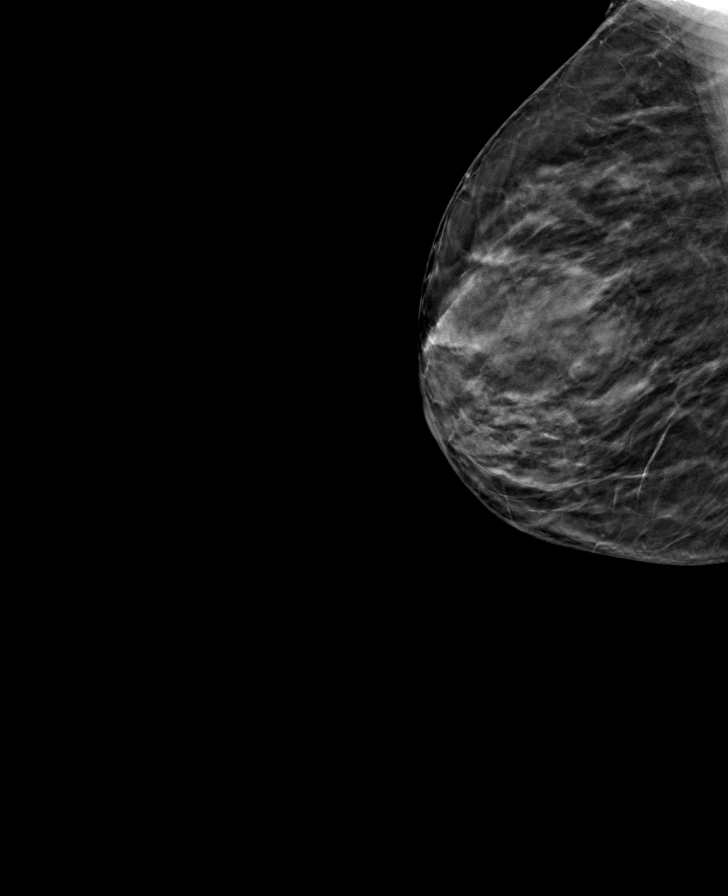

[R CC tomo · tomo slice 25/50.0]
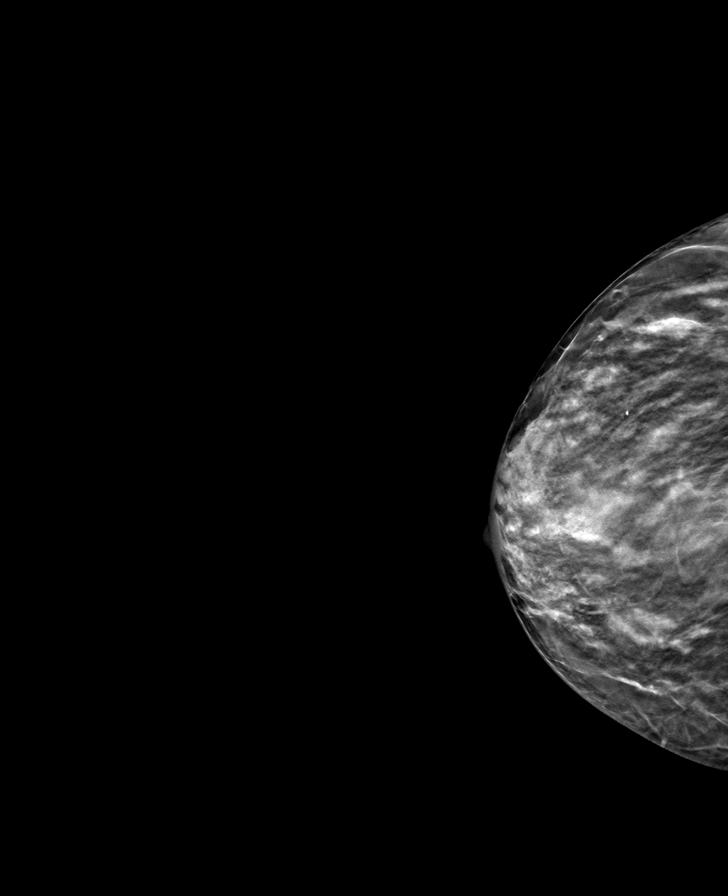

[8 of 24 positions shown; findings below may reference images not displayed]

ACR Breast Density Category c: The breast tissue is heterogeneously
dense, which may obscure small masses.
FINDINGS: There are no findings suspicious for malignancy. Images were
processed with CAD.
IMPRESSION: No mammographic evidence of malignancy. A result letter of this
screening mammogram will be mailed directly to the patient.

RECOMMENDATION:
Screening mammogram in one year. (Code:[id number]-[id number])

BI-RADS CATEGORY  1: Negative.

## 2021-04-24 DIAGNOSIS — M47816 Spondylosis without myelopathy or radiculopathy, lumbar region: Secondary | ICD-10-CM | POA: Diagnosis not present

## 2021-04-27 ENCOUNTER — Telehealth: Payer: Self-pay | Admitting: *Deleted

## 2021-04-27 ENCOUNTER — Inpatient Hospital Stay: Payer: Medicare HMO | Attending: Oncology

## 2021-04-27 ENCOUNTER — Other Ambulatory Visit: Payer: Self-pay

## 2021-04-27 DIAGNOSIS — Z809 Family history of malignant neoplasm, unspecified: Secondary | ICD-10-CM | POA: Insufficient documentation

## 2021-04-27 DIAGNOSIS — Z86010 Personal history of colonic polyps: Secondary | ICD-10-CM | POA: Diagnosis not present

## 2021-04-27 DIAGNOSIS — R7989 Other specified abnormal findings of blood chemistry: Secondary | ICD-10-CM | POA: Diagnosis not present

## 2021-04-27 DIAGNOSIS — I10 Essential (primary) hypertension: Secondary | ICD-10-CM | POA: Diagnosis not present

## 2021-04-27 DIAGNOSIS — D509 Iron deficiency anemia, unspecified: Secondary | ICD-10-CM | POA: Insufficient documentation

## 2021-04-27 DIAGNOSIS — R944 Abnormal results of kidney function studies: Secondary | ICD-10-CM | POA: Insufficient documentation

## 2021-04-27 DIAGNOSIS — M1990 Unspecified osteoarthritis, unspecified site: Secondary | ICD-10-CM | POA: Insufficient documentation

## 2021-04-27 DIAGNOSIS — D709 Neutropenia, unspecified: Secondary | ICD-10-CM | POA: Diagnosis not present

## 2021-04-27 DIAGNOSIS — G250 Essential tremor: Secondary | ICD-10-CM | POA: Insufficient documentation

## 2021-04-27 LAB — CBC WITH DIFFERENTIAL (CANCER CENTER ONLY)
Abs Immature Granulocytes: 0 10*3/uL (ref 0.00–0.07)
Basophils Absolute: 0 10*3/uL (ref 0.0–0.1)
Basophils Relative: 1 %
Eosinophils Absolute: 0.2 10*3/uL (ref 0.0–0.5)
Eosinophils Relative: 5 %
HCT: 32.7 % — ABNORMAL LOW (ref 36.0–46.0)
Hemoglobin: 10.3 g/dL — ABNORMAL LOW (ref 12.0–15.0)
Immature Granulocytes: 0 %
Lymphocytes Relative: 52 %
Lymphs Abs: 1.8 10*3/uL (ref 0.7–4.0)
MCH: 22.9 pg — ABNORMAL LOW (ref 26.0–34.0)
MCHC: 31.5 g/dL (ref 30.0–36.0)
MCV: 72.8 fL — ABNORMAL LOW (ref 80.0–100.0)
Monocytes Absolute: 0.5 10*3/uL (ref 0.1–1.0)
Monocytes Relative: 14 %
Neutro Abs: 1 10*3/uL — ABNORMAL LOW (ref 1.7–7.7)
Neutrophils Relative %: 28 %
Platelet Count: 243 10*3/uL (ref 150–400)
RBC: 4.49 MIL/uL (ref 3.87–5.11)
RDW: 15.7 % — ABNORMAL HIGH (ref 11.5–15.5)
WBC Count: 3.5 10*3/uL — ABNORMAL LOW (ref 4.0–10.5)
nRBC: 0 % (ref 0.0–0.2)

## 2021-04-27 LAB — SAVE SMEAR(SSMR), FOR PROVIDER SLIDE REVIEW

## 2021-04-27 NOTE — Progress Notes (Signed)
New Hematology/Oncology Consult   Requesting MD: Dr. Benita Stabile  [phone]  Reason for Consult: Anemia with elevated ferritin, low iron levels  HPI: Ms. Brittle is an 80 year old woman referred for evaluation of anemia in the setting of elevated ferritin and low iron levels.  She was seen by Dr. Deforest Hoyles 03/28/2021 for an annual wellness visit.  Labs showed hemoglobin 9.7, MCV 72, RDW 15.9, white count 5.6, platelet count 196,000, creatinine 1.4, ferritin 442 (11-306), TIBC 221 (250-450), iron 86 (50-2 12), iron saturation 39% (20-55), transferrin 158 (203-362).  Review of labs in the EMR shows a microcytic anemia dating as far back as 01/12/2010(hemoglobin 11.3, MCV 72.2).  Labs from 07/08/2011 show hemoglobin 10.5, MCV 69.6; 07/11/2016 hemoglobin 10.9, MCV 69.3.  Labs done in our office 04/27/2021-hemoglobin 10.3, MCV 72.8, total white count 3.5, ANC 1.0, platelet count 243,000.  Past Medical History:  Diagnosis Date   Anemia    Arthritis    arthritis in knees and hands . Right wrist and thumb splinted due to pain.   Blood transfusion    10/12 after knee surgery    Blood transfusion without reported diagnosis    Essential tremor    Glaucoma    H/O seasonal allergies    sinus congestion   Hx of adenomatous polyp of colon 08/18/2014   Hypertension    Tremor of both hands    mild , tx. Primidone  :   Past Surgical History:  Procedure Laterality Date   ABDOMINAL HYSTERECTOMY     Vaginal '88   CERVICAL LAMINECTOMY  1995   cervical fusion"some limited ROM"   [place]     right knee 10/12 , left knee 2006    KNEE ARTHROTOMY Right 07/17/2016   Procedure: KNEE ARTHROTOMY with scar excision;  Surgeon: Gaynelle Arabian, MD;  Location: [facility];  Service: Orthopedics;  Laterality: Right;  spinal+block   KNEE CLOSED REDUCTION  07/10/2011   Procedure: CLOSED MANIPULATION KNEE;  Surgeon: Gearlean Alf;  Location: [facility];  Service:  Orthopedics;  Laterality: Right;   OTHER SURGICAL HISTORY     right knee replacement 8/11   :   Current Outpatient Medications:    amLODipine (NORVASC) 5 MG tablet, , Disp: , Rfl:    AMLODIPINE BESYLATE PO, Take 5 mg by mouth daily., Disp: , Rfl:    BIOTIN 5000 PO, Take 5,000 mcg by mouth daily. , Disp: , Rfl:    bumetanide (BUMEX) 1 MG tablet, Take 1 mg by mouth daily., Disp: , Rfl:    Calcium Citrate-Vitamin D (CITRACAL + D PO), Take 1 tablet by mouth 2 (two) times daily., Disp: , Rfl:    Cholecalciferol (VITAMIN D3) 1000 UNITS CAPS, Take 1,000 Units by mouth daily. , Disp: , Rfl:    clindamycin (CLEOCIN) 150 MG capsule, Take 4 capsules orally prior to dental procedure., Disp: , Rfl:    doxazosin (CARDURA) 4 MG tablet, , Disp: , Rfl:    ferrous gluconate (FERGON) 324 MG tablet, Take 324 mg by mouth daily., Disp: , Rfl: 6   fish oil-omega-3 fatty acids 1000 MG capsule, Take 1 g by mouth 2 (two) times daily. , Disp: , Rfl:    latanoprost (XALATAN) 0.005 % ophthalmic solution, Place 1 drop into both eyes at bedtime., Disp: , Rfl:    methocarbamol (ROBAXIN) 500 MG tablet, Take 1-2 tablet by mouth every six to eight hours as needed FOR SPASMS/MUSCLE  TENSION, Disp: , Rfl:    Multiple Vitamin (MULTIVITAMIN) tablet, Take 1 tablet by mouth daily., Disp: , Rfl:    predniSONE (STERAPRED UNI-PAK 48 TAB) 5 MG (48) TBPK tablet, Take 5 mg by mouth as directed., Disp: , Rfl:    primidone (MYSOLINE) 50 MG tablet, Take 0.5 tablets (25 mg total) by mouth 2 (two) times daily., Disp: 90 tablet, Rfl: 4   SHINGRIX injection, , Disp: , Rfl:    simvastatin (ZOCOR) 20 MG tablet, , Disp: , Rfl:    timolol (TIMOPTIC) 0.5 % ophthalmic solution, Place 1 drop into both eyes daily., Disp: , Rfl:    traMADol (ULTRAM) 50 MG tablet, Take 1 tablet (50 mg total) by mouth every 6 (six) hours as needed for moderate pain., Disp: 56 tablet, Rfl: 0   valsartan (DIOVAN) 320 MG tablet, , Disp: , Rfl:    vitamin C (ASCORBIC ACID)  500 MG tablet, Take 500 mg by mouth daily., Disp: , Rfl:    loratadine (CLARITIN) 10 MG tablet, Take 10 mg by mouth daily., Disp: , Rfl:    oxyCODONE (OXY IR/ROXICODONE) 5 MG immediate release tablet, Take 1-2 tablets (5-10 mg total) by mouth every 4 (four) hours as needed for severe pain. (Patient not taking: Reported on 04/30/2021), Disp: 84 tablet, Rfl: 0   tiZANidine (ZANAFLEX) 2 MG tablet, TAKE 1 TABLET BY MOUTH 2-3 TIMES A DAY AS NEEDED FOR MUSCLE SPASM (Patient not taking: No sig reported), Disp: , Rfl: :  :   Allergies  Allergen Reactions   Clinoril [Sulindac]     Stomach upset   Sulfamethoxazole-Trimethoprim     Other reaction(s): rash   Tizanidine     Other reaction(s): itch   Celebrex [Celecoxib] Rash    Stomach upset   Penicillins Rash    Has patient had a PCN reaction causing immediate rash, facial/tongue/throat swelling, SOB or lightheadedness with hypotension:Yes Has patient had a PCN reaction causing severe rash involving mucus membranes or skin necrosis: No Has patient had a PCN reaction that required hospitalization No Has patient had a PCN reaction occurring within the last 10 years: No If all of the above answers are "NO", then may proceed with Cephalosporin use.    Sulfa Antibiotics Rash    FH: Mother deceased with a stroke; father deceased unknown cause; brother deceased with Parkinson's; brother deceased with diabetes; brother living, has a cancer, specific type unknown  SOCIAL HISTORY: Ms. Fillingim lives in [place].  She is married.  She has 1 child.  She is retired from [employer].  No tobacco or alcohol use.  She reports having a blood transfusion in 2012 at the time of knee surgery.  Review of Systems: She denies bleeding.  Stools black since beginning oral iron.  She reports being up-to-date on colonoscopy screening.  No fevers or sweats.  She has a good appetite.  No weight loss.  She has back and knee pain related to osteoarthritis.  No dysphagia.  No  nausea or vomiting.  No change in bowel habits.  No abdominal pain.  She denies cough and shortness of breath.  No chest pain.  No leg swelling.  No calf pain.  No urinary symptoms.  She denies hematuria.  No numbness or tingling in the hands or feet.  No rash.  No joint redness.  Knees intermittently swell.  Physical Exam:  Blood pressure (!) 177/61, pulse 87, temperature 98.7 F (37.1 C), temperature source Oral, resp. rate 18, height '5\' 2"'  (1.575 m), weight  155 lb 3.2 oz (70.4 kg), SpO2 98 %.  HEENT: Sclera anicteric.  No thrush or ulcers. Lungs: Lungs clear bilaterally. Cardiac: Regular rate and rhythm. Abdomen: Abdomen soft and nontender.  No hepatosplenomegaly. Vascular: Trace lower leg edema bilaterally. Lymph nodes: No palpable cervical, supraclavicular, axillary or inguinal lymph nodes. Neurologic: Alert and oriented.  Follows commands. Skin: No rash. Musculoskeletal: Surgical incision bilateral knee.  No erythema over the knees.  LABS:   Recent Labs    04/27/21 1330  WBC 3.5*  HGB 10.3*  HCT 32.7*  PLT 243   Peripheral blood smear-decreased number of neutrophils, no blasts or other young forms; moderate number of target cells and ovalocytes, few teardrops, polychromasia not increased; platelets appear normal in number.   RADIOLOGY:  No results found.  Assessment and Plan:   Microcytic anemia Neutropenia Hypertension Osteoarthritis Creatinine 1.4 on 03/28/2021 Tremor Glaucoma  Ms. Sharpnack has been referred for evaluation of anemia in the setting of an elevated ferritin.  The anemia is microcytic and dates to at least 2011.  On the labs we did in the office last week she is also noted to be neutropenic.  The microcytic anemia may be related to an inherited blood disorder.  We will obtain a hemoglobin electrophoresis and alpha globin gene mapping.  We also discussed the possibility of myelodysplasia.  We had preliminary discussion regarding a bone marrow  biopsy.  She will return to review outstanding labs in approximately 2 weeks.  We are available to see her sooner if needed.  Patient seen with Dr. Benay Spice.  Ned Card, NP 04/30/2021, 12:08 PM   This was a shared visit with Ned Card.  Ms. Akard was interviewed and examined.  I reviewed the peripheral blood smear.  She is referred for evaluation of microcytic anemia.  She also has mild leukopenia/neutropenia  She has chronic Red cell microcytosis, potentially indicating a thalassemia variant.  The Red cell changes on the peripheral blood smear today are consistent with underlying thalassemia, possibly combined with another hematologic abnormality.  The differential diagnosis includes myelodysplasia and a hematologic malignancy.  The anemia could also be in part related to renal insufficiency.  We obtained additional diagnostic studies today.  We will recommend a diagnostic bone marrow biopsy if she develops progressive anemia or neutropenia.  I was present for greater than 50% of today's visit.  I performed medical decision making.  Julieanne Manson, MD

## 2021-04-30 ENCOUNTER — Inpatient Hospital Stay (HOSPITAL_BASED_OUTPATIENT_CLINIC_OR_DEPARTMENT_OTHER): Payer: Medicare HMO | Admitting: Nurse Practitioner

## 2021-04-30 ENCOUNTER — Inpatient Hospital Stay: Payer: Medicare HMO

## 2021-04-30 ENCOUNTER — Other Ambulatory Visit: Payer: Self-pay

## 2021-04-30 VITALS — BP 177/61 | HR 87 | Temp 98.7°F | Resp 18 | Ht 62.0 in | Wt 155.2 lb

## 2021-04-30 DIAGNOSIS — D509 Iron deficiency anemia, unspecified: Secondary | ICD-10-CM

## 2021-04-30 DIAGNOSIS — R944 Abnormal results of kidney function studies: Secondary | ICD-10-CM | POA: Diagnosis not present

## 2021-04-30 DIAGNOSIS — I10 Essential (primary) hypertension: Secondary | ICD-10-CM

## 2021-04-30 DIAGNOSIS — D709 Neutropenia, unspecified: Secondary | ICD-10-CM

## 2021-04-30 DIAGNOSIS — R251 Tremor, unspecified: Secondary | ICD-10-CM

## 2021-04-30 DIAGNOSIS — G250 Essential tremor: Secondary | ICD-10-CM | POA: Diagnosis not present

## 2021-04-30 DIAGNOSIS — M1990 Unspecified osteoarthritis, unspecified site: Secondary | ICD-10-CM | POA: Diagnosis not present

## 2021-04-30 DIAGNOSIS — H409 Unspecified glaucoma: Secondary | ICD-10-CM

## 2021-04-30 DIAGNOSIS — Z86010 Personal history of colonic polyps: Secondary | ICD-10-CM | POA: Diagnosis not present

## 2021-04-30 DIAGNOSIS — R7989 Other specified abnormal findings of blood chemistry: Secondary | ICD-10-CM | POA: Diagnosis not present

## 2021-04-30 DIAGNOSIS — Z809 Family history of malignant neoplasm, unspecified: Secondary | ICD-10-CM | POA: Diagnosis not present

## 2021-05-01 LAB — MISC LABCORP TEST (SEND OUT): LabCorp test name: 81257

## 2021-05-01 LAB — KAPPA/LAMBDA LIGHT CHAINS
Kappa free light chain: 53.4 mg/L — ABNORMAL HIGH (ref 3.3–19.4)
Kappa, lambda light chain ratio: 1.53 (ref 0.26–1.65)
Lambda free light chains: 35 mg/L — ABNORMAL HIGH (ref 5.7–26.3)

## 2021-05-02 LAB — MULTIPLE MYELOMA PANEL, SERUM
Albumin SerPl Elph-Mcnc: 4 g/dL (ref 2.9–4.4)
Albumin/Glob SerPl: 1.2 (ref 0.7–1.7)
Alpha 1: 0.2 g/dL (ref 0.0–0.4)
Alpha2 Glob SerPl Elph-Mcnc: 0.8 g/dL (ref 0.4–1.0)
B-Globulin SerPl Elph-Mcnc: 1.3 g/dL (ref 0.7–1.3)
Gamma Glob SerPl Elph-Mcnc: 1.1 g/dL (ref 0.4–1.8)
Globulin, Total: 3.4 g/dL (ref 2.2–3.9)
IgA: 475 mg/dL — ABNORMAL HIGH (ref 64–422)
IgG (Immunoglobin G), Serum: 1203 mg/dL (ref 586–1602)
IgM (Immunoglobulin M), Srm: 53 mg/dL (ref 26–217)
Total Protein ELP: 7.4 g/dL (ref 6.0–8.5)

## 2021-05-02 LAB — HGB FRACTIONATION CASCADE
Hgb A2: 6.6 % — ABNORMAL HIGH (ref 1.8–3.2)
Hgb A: 89.5 % — ABNORMAL LOW (ref 96.4–98.8)
Hgb F: 3.9 % — ABNORMAL HIGH (ref 0.0–2.0)
Hgb S: 0 %

## 2021-05-14 ENCOUNTER — Other Ambulatory Visit: Payer: Self-pay | Admitting: *Deleted

## 2021-05-14 ENCOUNTER — Inpatient Hospital Stay (HOSPITAL_BASED_OUTPATIENT_CLINIC_OR_DEPARTMENT_OTHER): Payer: Medicare HMO | Admitting: Oncology

## 2021-05-14 ENCOUNTER — Other Ambulatory Visit: Payer: Self-pay

## 2021-05-14 ENCOUNTER — Inpatient Hospital Stay: Payer: Medicare HMO | Attending: Oncology

## 2021-05-14 VITALS — BP 170/67 | HR 61 | Temp 98.7°F | Resp 20 | Ht 62.0 in | Wt 154.4 lb

## 2021-05-14 DIAGNOSIS — D509 Iron deficiency anemia, unspecified: Secondary | ICD-10-CM | POA: Diagnosis not present

## 2021-05-14 DIAGNOSIS — D709 Neutropenia, unspecified: Secondary | ICD-10-CM

## 2021-05-14 LAB — CBC WITH DIFFERENTIAL (CANCER CENTER ONLY)
Abs Immature Granulocytes: 0.02 10*3/uL (ref 0.00–0.07)
Basophils Absolute: 0 10*3/uL (ref 0.0–0.1)
Basophils Relative: 0 %
Eosinophils Absolute: 0.2 10*3/uL (ref 0.0–0.5)
Eosinophils Relative: 3 %
HCT: 32.3 % — ABNORMAL LOW (ref 36.0–46.0)
Hemoglobin: 10.1 g/dL — ABNORMAL LOW (ref 12.0–15.0)
Immature Granulocytes: 0 %
Lymphocytes Relative: 34 %
Lymphs Abs: 2.4 10*3/uL (ref 0.7–4.0)
MCH: 22.5 pg — ABNORMAL LOW (ref 26.0–34.0)
MCHC: 31.3 g/dL (ref 30.0–36.0)
MCV: 72.1 fL — ABNORMAL LOW (ref 80.0–100.0)
Monocytes Absolute: 0.7 10*3/uL (ref 0.1–1.0)
Monocytes Relative: 9 %
Neutro Abs: 4 10*3/uL (ref 1.7–7.7)
Neutrophils Relative %: 54 %
Platelet Count: 236 10*3/uL (ref 150–400)
RBC: 4.48 MIL/uL (ref 3.87–5.11)
RDW: 15.9 % — ABNORMAL HIGH (ref 11.5–15.5)
WBC Count: 7.3 10*3/uL (ref 4.0–10.5)
nRBC: 0 % (ref 0.0–0.2)

## 2021-05-14 NOTE — Progress Notes (Signed)
  [place] OFFICE PROGRESS NOTE   Diagnosis: Microcytic anemia  INTERVAL HISTORY:   Jenna Howe returns as scheduled.  She is here with her husband.  She reports chronic back and knee pain.  She is scheduled for a spine procedure within the next few weeks.  No bleeding.  No other complaint.  Objective:  Vital signs in last 24 hours:  Blood pressure (!) 170/67, pulse 61, temperature 98.7 F (37.1 C), temperature source Oral, resp. rate 20, height 5' 2" (1.575 m), weight 154 lb 6.4 oz (70 kg), SpO2 100 %.    Resp: Lungs clear bilaterally Cardio: Regular rate and rhythm GI: No hepatosplenomegaly Vascular: No leg edema   Lab Results:  Lab Results  Component Value Date   WBC 7.3 05/14/2021   HGB 10.1 (L) 05/14/2021   HCT 32.3 (L) 05/14/2021   MCV 72.1 (L) 05/14/2021   PLT 236 05/14/2021   NEUTROABS 4.0 05/14/2021    CMP  Lab Results  Component Value Date   NA 141 07/11/2016   K 3.7 07/11/2016   CL 101 07/11/2016   CO2 32 07/11/2016   GLUCOSE 116 (H) 07/11/2016   BUN 15 07/11/2016   CREATININE 0.98 07/17/2016   CALCIUM 8.8 (L) 07/11/2016   PROT 7.8 04/26/2011   ALBUMIN 4.0 04/26/2011   AST 23 04/26/2011   ALT 15 04/26/2011   ALKPHOS 75 04/26/2011   BILITOT 0.2 (L) 04/26/2011   GFRNONAA 55 (L) 07/17/2016   GFRAA >60 07/17/2016   04/30/2021: IgG 1203, IgM 53, IgA 475 (64-422), no M spike observed, immunofixation-polyclonal increase in immunoglobulins  Hemoglobin electrophoresis: Hemoglobin A 89.5, hemoglobin A 2 6.6%, hemoglobin F 3.9% Medications: I have reviewed the patient's current medications.   Assessment/Plan: Microcytic anemia Hemoglobin electrophoresis 04/30/2021 consistent with beta thalassemia minor Mild neutropenia 04/30/2021, normal on 05/14/2021 Hypertension Osteoarthritis Creatinine 1.4 on 03/28/2021 Tremor Glaucoma Mild elevation of kappa and lambda free light chains 04/30/2021    Disposition: Jenna Howe is referred for  evaluation of microcytic anemia.  She has longstanding microcytic anemia.  She appears to have beta thalassemia minor.  Alpha globin gene mapping is pending.  The anemia could also be in part related to renal insufficiency or a chronic inflammatory process.  Mild elevation of the kappa and lambda free light chains may be related to renal insufficiency.  I have a low clinical suspicion for multiple myeloma or a malignancy.  The ferritin level was elevated in September.  She will discontinue iron therapy.  She will return for an office visit and CBC in 4 months.  She understands no treatment is needed for the thalassemia.  Betsy Coder, MD  05/14/2021  3:43 PM

## 2021-05-14 NOTE — Progress Notes (Signed)
MD discontinued iron today.

## 2021-05-15 ENCOUNTER — Telehealth: Payer: Self-pay | Admitting: Oncology

## 2021-05-15 LAB — ALPHA-THALASSEMIA GENOTYPR

## 2021-05-15 NOTE — Telephone Encounter (Signed)
Scheduled appointment per 11/07 los. Patient aware.

## 2021-05-28 DIAGNOSIS — L668 Other cicatricial alopecia: Secondary | ICD-10-CM | POA: Diagnosis not present

## 2021-06-04 ENCOUNTER — Telehealth: Payer: Self-pay

## 2021-06-04 DIAGNOSIS — M1990 Unspecified osteoarthritis, unspecified site: Secondary | ICD-10-CM | POA: Diagnosis not present

## 2021-06-04 DIAGNOSIS — D509 Iron deficiency anemia, unspecified: Secondary | ICD-10-CM | POA: Diagnosis not present

## 2021-06-04 DIAGNOSIS — I701 Atherosclerosis of renal artery: Secondary | ICD-10-CM | POA: Diagnosis not present

## 2021-06-04 DIAGNOSIS — N1831 Chronic kidney disease, stage 3a: Secondary | ICD-10-CM | POA: Diagnosis not present

## 2021-06-04 DIAGNOSIS — I10 Essential (primary) hypertension: Secondary | ICD-10-CM | POA: Diagnosis not present

## 2021-06-04 DIAGNOSIS — E782 Mixed hyperlipidemia: Secondary | ICD-10-CM | POA: Diagnosis not present

## 2021-06-04 DIAGNOSIS — H409 Unspecified glaucoma: Secondary | ICD-10-CM | POA: Diagnosis not present

## 2021-06-11 DIAGNOSIS — R232 Flushing: Secondary | ICD-10-CM | POA: Diagnosis not present

## 2021-06-11 DIAGNOSIS — Z01419 Encounter for gynecological examination (general) (routine) without abnormal findings: Secondary | ICD-10-CM | POA: Diagnosis not present

## 2021-06-11 DIAGNOSIS — Z780 Asymptomatic menopausal state: Secondary | ICD-10-CM | POA: Diagnosis not present

## 2021-06-14 ENCOUNTER — Other Ambulatory Visit: Payer: Self-pay | Admitting: Nurse Practitioner

## 2021-06-14 DIAGNOSIS — N1831 Chronic kidney disease, stage 3a: Secondary | ICD-10-CM | POA: Diagnosis not present

## 2021-06-14 DIAGNOSIS — I701 Atherosclerosis of renal artery: Secondary | ICD-10-CM | POA: Diagnosis not present

## 2021-06-14 DIAGNOSIS — M1990 Unspecified osteoarthritis, unspecified site: Secondary | ICD-10-CM | POA: Diagnosis not present

## 2021-06-14 DIAGNOSIS — I10 Essential (primary) hypertension: Secondary | ICD-10-CM | POA: Diagnosis not present

## 2021-06-14 DIAGNOSIS — Z780 Asymptomatic menopausal state: Secondary | ICD-10-CM

## 2021-06-14 DIAGNOSIS — H409 Unspecified glaucoma: Secondary | ICD-10-CM | POA: Diagnosis not present

## 2021-06-14 DIAGNOSIS — D509 Iron deficiency anemia, unspecified: Secondary | ICD-10-CM | POA: Diagnosis not present

## 2021-06-14 DIAGNOSIS — E782 Mixed hyperlipidemia: Secondary | ICD-10-CM | POA: Diagnosis not present

## 2021-06-28 DIAGNOSIS — M47817 Spondylosis without myelopathy or radiculopathy, lumbosacral region: Secondary | ICD-10-CM | POA: Diagnosis not present

## 2021-06-28 DIAGNOSIS — M47816 Spondylosis without myelopathy or radiculopathy, lumbar region: Secondary | ICD-10-CM | POA: Diagnosis not present

## 2021-07-03 NOTE — Progress Notes (Signed)
Left message for Pt to return call.

## 2021-07-04 ENCOUNTER — Telehealth: Payer: Self-pay

## 2021-07-04 NOTE — Telephone Encounter (Signed)
-----   Message from Ladell Pier, MD sent at 07/03/2021  6:53 AM EST ----- Please call patient, gene mapping confirmed alpha thalassemia trait, no treatment needed, f/u as scheduled

## 2021-07-04 NOTE — Telephone Encounter (Signed)
Pt verbalized understanding.

## 2021-07-30 DIAGNOSIS — M47816 Spondylosis without myelopathy or radiculopathy, lumbar region: Secondary | ICD-10-CM | POA: Diagnosis not present

## 2021-08-23 NOTE — Telephone Encounter (Signed)
This encounter was created in error - please disregard.

## 2021-09-03 DIAGNOSIS — M47816 Spondylosis without myelopathy or radiculopathy, lumbar region: Secondary | ICD-10-CM | POA: Diagnosis not present

## 2021-09-10 DIAGNOSIS — M545 Low back pain, unspecified: Secondary | ICD-10-CM | POA: Diagnosis not present

## 2021-09-10 DIAGNOSIS — M5451 Vertebrogenic low back pain: Secondary | ICD-10-CM | POA: Diagnosis not present

## 2021-09-12 ENCOUNTER — Ambulatory Visit: Payer: Medicare HMO | Admitting: Nurse Practitioner

## 2021-09-12 ENCOUNTER — Other Ambulatory Visit: Payer: Medicare HMO

## 2021-09-12 DIAGNOSIS — H401132 Primary open-angle glaucoma, bilateral, moderate stage: Secondary | ICD-10-CM | POA: Diagnosis not present

## 2021-09-12 DIAGNOSIS — H25013 Cortical age-related cataract, bilateral: Secondary | ICD-10-CM | POA: Diagnosis not present

## 2021-09-12 DIAGNOSIS — H52203 Unspecified astigmatism, bilateral: Secondary | ICD-10-CM | POA: Diagnosis not present

## 2021-09-17 ENCOUNTER — Other Ambulatory Visit: Payer: Self-pay

## 2021-09-17 ENCOUNTER — Inpatient Hospital Stay: Payer: Medicare HMO

## 2021-09-17 ENCOUNTER — Inpatient Hospital Stay (HOSPITAL_BASED_OUTPATIENT_CLINIC_OR_DEPARTMENT_OTHER): Payer: Medicare HMO | Admitting: Nurse Practitioner

## 2021-09-17 ENCOUNTER — Inpatient Hospital Stay: Payer: Medicare HMO | Attending: Oncology

## 2021-09-17 ENCOUNTER — Encounter: Payer: Self-pay | Admitting: Nurse Practitioner

## 2021-09-17 VITALS — BP 179/66 | HR 83 | Temp 98.7°F | Resp 20 | Wt 150.8 lb

## 2021-09-17 DIAGNOSIS — G8929 Other chronic pain: Secondary | ICD-10-CM | POA: Diagnosis not present

## 2021-09-17 DIAGNOSIS — D509 Iron deficiency anemia, unspecified: Secondary | ICD-10-CM

## 2021-09-17 DIAGNOSIS — M549 Dorsalgia, unspecified: Secondary | ICD-10-CM | POA: Diagnosis not present

## 2021-09-17 LAB — CBC WITH DIFFERENTIAL (CANCER CENTER ONLY)
Abs Immature Granulocytes: 0.01 10*3/uL (ref 0.00–0.07)
Basophils Absolute: 0 10*3/uL (ref 0.0–0.1)
Basophils Relative: 1 %
Eosinophils Absolute: 0.4 10*3/uL (ref 0.0–0.5)
Eosinophils Relative: 8 %
HCT: 36 % (ref 36.0–46.0)
Hemoglobin: 11 g/dL — ABNORMAL LOW (ref 12.0–15.0)
Immature Granulocytes: 0 %
Lymphocytes Relative: 42 %
Lymphs Abs: 2.3 10*3/uL (ref 0.7–4.0)
MCH: 22 pg — ABNORMAL LOW (ref 26.0–34.0)
MCHC: 30.6 g/dL (ref 30.0–36.0)
MCV: 72 fL — ABNORMAL LOW (ref 80.0–100.0)
Monocytes Absolute: 0.5 10*3/uL (ref 0.1–1.0)
Monocytes Relative: 10 %
Neutro Abs: 2.1 10*3/uL (ref 1.7–7.7)
Neutrophils Relative %: 39 %
Platelet Count: 226 10*3/uL (ref 150–400)
RBC: 5 MIL/uL (ref 3.87–5.11)
RDW: 16.5 % — ABNORMAL HIGH (ref 11.5–15.5)
WBC Count: 5.5 10*3/uL (ref 4.0–10.5)
nRBC: 0 % (ref 0.0–0.2)

## 2021-09-17 NOTE — Progress Notes (Signed)
?  [facility] ?OFFICE PROGRESS NOTE ? ? ?Diagnosis: Microcytic anemia ? ?INTERVAL HISTORY:  ? ?Jenna Howe returns as scheduled.  She has chronic back pain.  Main complaint is a poor energy level.  No bleeding. ? ?Objective: ? ?Vital signs in last 24 hours: ? ?Blood pressure (!) 179/66, pulse 83, temperature 98.7 ?F (37.1 ?C), temperature source Temporal, resp. rate 20, weight 150 lb 12.8 oz (68.4 kg), SpO2 97 %. ?  ? ?Resp: Lungs clear bilaterally. ?Cardio: Regular rate and rhythm. ?GI: No hepatosplenomegaly. ?Vascular: No leg edema. ? ?Lab Results: ? ?Lab Results  ?Component Value Date  ? WBC 5.5 09/17/2021  ? HGB 11.0 (L) 09/17/2021  ? HCT 36.0 09/17/2021  ? MCV 72.0 (L) 09/17/2021  ? PLT 226 09/17/2021  ? NEUTROABS 2.1 09/17/2021  ? ? ?Imaging: ? ?No results found. ? ?Medications: I have reviewed the patient's current medications. ? ?Assessment/Plan: ?Microcytic anemia ?Hemoglobin electrophoresis 04/30/2021 consistent with beta thalassemia minor ?Alpha thalassemia DNA analysis 04/30/2021 consistent with alpha thalassemia trait ?Mild neutropenia 04/30/2021, normal on 05/14/2021 and 09/17/2021 ?Hypertension ?Osteoarthritis ?Creatinine 1.4 on 03/28/2021 ?Tremor ?Glaucoma ?Mild elevation of kappa and lambda free light chains 04/30/2021 ? ?Disposition: Jenna Howe appears unchanged.  We reviewed the alpha thalassemia DNA analysis showing single gene deletion with Jenna Howe and her husband.  We again reviewed the hemoglobin electrophoresis results which are consistent with beta thalassemia minor.  Dr. Benay Spice recommends beta thalassemia gene testing.  She agrees with this plan.  We will contact her once the result is available.  She will return for a CBC and follow-up visit in 4 months. ? ?Patient seen with Dr. Benay Spice. ? ? ? ?Ned Card ANP/GNP-BC  ? ?09/17/2021  ?1:42 PM ? ?This was a shared visit with Ned Card.  Jenna Howe has persistent mild microcytic anemia.  The anemia appears to be related to  thalassemia.  Alpha globin gene testing confirms single gene deletion alpha thalassemia.  The hemoglobin A 2 was elevated on the hemoglobin electrophoresis.  She may have coexisting beta thalassemia trait.  We will submit blood for beta thalassemia gene testing.  We discussed the laboratory findings with Jenna Howe and her husband.  She agrees to beta globin gene testing. ? ?I was present for greater than 50% of today's visit.  I performed medical decision making. ? ?Julieanne Manson, MD ? ? ? ? ? ?

## 2021-09-18 DIAGNOSIS — M5451 Vertebrogenic low back pain: Secondary | ICD-10-CM | POA: Diagnosis not present

## 2021-09-18 DIAGNOSIS — M545 Low back pain, unspecified: Secondary | ICD-10-CM | POA: Diagnosis not present

## 2021-09-24 DIAGNOSIS — M545 Low back pain, unspecified: Secondary | ICD-10-CM | POA: Diagnosis not present

## 2021-09-24 DIAGNOSIS — M5451 Vertebrogenic low back pain: Secondary | ICD-10-CM | POA: Diagnosis not present

## 2021-09-27 ENCOUNTER — Ambulatory Visit (INDEPENDENT_AMBULATORY_CARE_PROVIDER_SITE_OTHER): Payer: Medicare HMO | Admitting: Family Medicine

## 2021-09-27 ENCOUNTER — Encounter: Payer: Self-pay | Admitting: Family Medicine

## 2021-09-27 VITALS — BP 178/76 | HR 74 | Ht 64.0 in | Wt 150.0 lb

## 2021-09-27 DIAGNOSIS — I10 Essential (primary) hypertension: Secondary | ICD-10-CM | POA: Diagnosis not present

## 2021-09-27 DIAGNOSIS — G250 Essential tremor: Secondary | ICD-10-CM | POA: Diagnosis not present

## 2021-09-27 DIAGNOSIS — E782 Mixed hyperlipidemia: Secondary | ICD-10-CM | POA: Diagnosis not present

## 2021-09-27 DIAGNOSIS — N1831 Chronic kidney disease, stage 3a: Secondary | ICD-10-CM | POA: Diagnosis not present

## 2021-09-27 DIAGNOSIS — D509 Iron deficiency anemia, unspecified: Secondary | ICD-10-CM | POA: Diagnosis not present

## 2021-09-27 DIAGNOSIS — R7303 Prediabetes: Secondary | ICD-10-CM | POA: Diagnosis not present

## 2021-09-27 MED ORDER — PRIMIDONE 50 MG PO TABS: 25.0000 mg | ORAL_TABLET | Freq: Two times a day (BID) | ORAL | 4 refills | Status: DC

## 2021-09-27 NOTE — Patient Instructions (Signed)
Below is our plan: ? ?We will continue primidone '25mg'$  twice daily. Keep an eye on your blood pressure.  ? ?Please make sure you are staying well hydrated. I recommend 50-60 ounces daily. Well balanced diet and regular exercise encouraged. Consistent sleep schedule with 6-8 hours recommended.  ? ?Please continue follow up with care team as directed.  ? ?Follow up with me in 1 year  ? ?You may receive a survey regarding today's visit. I encourage you to leave honest feed back as I do use this information to improve patient care. Thank you for seeing me today!  ? ? ?

## 2021-09-27 NOTE — Progress Notes (Signed)
? ? ?Chief Complaint  ?Patient presents with  ? Follow-up  ?  Pt alone, rm 7. Overall things are stable. No issues or concerns  ? ? ?HISTORY OF PRESENT ILLNESS: ? ?09/27/21 ALL:  ?Jenna Howe for follow up for tremor. She continues primidone '25mg'$  BID. She continues to do very well. Tremor is mild and not limiting. She is tolerating meds well. BP is usually fairly normal at home. She has a log and has appt with PCP today at 2:45. She is feeling well and without concerns.  ? ?09/21/2020 ALL:  ?Jenna Howe is a 81 y.o. female here today for follow up for tremors. She continues primidone '25mg'$  twice daily and tolerating well. Tremor is well managed. She notices it most in left hand when anxious or in a hurry. No resting tremor. No difficulty swallowing, She has been having back pain and right sided leg pain. She is followed closely by PCP. She reports BP checked every other day at home and usually 110-130/70's.  ? ?HISTORY (copied from Dr Gladstone Lighter previous note) ? ?UPDATE (06/21/19, VRP): Since last visit, doing well. Tremors are improved. No new issues. Tolerating primidone '25mg'$  twice a day.    ?  ?UPDATE (03/26/17, VRP): Since last visit, doing well. Tolerating primidone '25mg'$  twice a day. No alleviating or aggravating factors.  ?  ?UPDATE (03/25/2016, MM): Jenna Howe is a 81 year old female with a history of essential tremor. She Howe today for follow-up. She is currently taking primidone 25 mg twice a day. She reports that this controls her tremor quite well. She is able to eat and dress herself without any difficulty. She states that stress is a trigger for her tremor. She feels that her tremor has improved. She denies any new neurological symptoms. She Howe today for an evaluation. ?  ?UPDATE (03/27/15, MM): Jenna Howe is a 81 year old female with a history of essential tremor. She Howe today for follow-up. She is currently taking primidone 25 mg twice a day. She reports that her tremor is under  good control. She states that if she is under a lot of stress or in a rush it'll become more prevalent. The tremor primarily affects the left hand. She denies any new neurological symptoms. She Howe today for an evaluation ?  ?PRIOR HPI (01/23/11, VRP): 81 year old right-handed female with hypertension, history of cervical laminectomy and discectomy, arthritis, bilateral total knee replacements, here for evaluation of tremor since Jan 2012. The patient reports gradual, progressive tremor of bilateral upper extremities (left greater than right), mainly when she is doing specific tasks such as applying makeup, putting on earrings, using a spoon or drinking from a glass. She denies any tremor at rest. She denies any new smell or taste difficulties, sleep disturbances or walking problems. She denies any swallowing difficulties. She has a brother with Parkinson's disease. No other family history of tremor ? ? ?REVIEW OF SYSTEMS: Out of a complete 14 system review of symptoms, the patient complains only of the following symptoms, tremor, back pain, leg swelling, and all other reviewed systems are negative. ? ? ?ALLERGIES: ?Allergies  ?Allergen Reactions  ? Clinoril [Sulindac]   ?  Stomach upset  ? Sulfamethoxazole-Trimethoprim   ?  Other reaction(s): rash  ? Tizanidine   ?  Other reaction(s): itch  ? Celebrex [Celecoxib] Rash  ?  Stomach upset  ? Penicillins Rash  ?  Has patient had a PCN reaction causing immediate rash, facial/tongue/throat swelling, SOB or lightheadedness with hypotension:Yes ?Has patient had a  PCN reaction causing severe rash involving mucus membranes or skin necrosis: No ?Has patient had a PCN reaction that required hospitalization No ?Has patient had a PCN reaction occurring within the last 10 years: No ?If all of the above answers are "NO", then may proceed with Cephalosporin use. ?  ? Sulfa Antibiotics Rash  ? Sulfur Rash  ? ? ? ?HOME MEDICATIONS: ?Outpatient Medications Prior to Visit   ?Medication Sig Dispense Refill  ? amLODipine (NORVASC) 5 MG tablet Take 5 mg by mouth daily.    ? BIOTIN 5000 PO Take 5,000 mcg by mouth daily.     ? bumetanide (BUMEX) 1 MG tablet Take 1 mg by mouth daily.    ? Calcium Citrate-Vitamin D (CITRACAL + D PO) Take 1 tablet by mouth 2 (two) times daily.    ? Cholecalciferol (VITAMIN D3) 1000 UNITS CAPS Take 1,000 Units by mouth daily.     ? clindamycin (CLEOCIN) 150 MG capsule Take 4 capsules orally prior to dental procedure.    ? doxazosin (CARDURA) 4 MG tablet Take 4 mg by mouth daily.    ? fish oil-omega-3 fatty acids 1000 MG capsule Take 1 g by mouth 2 (two) times daily.     ? latanoprost (XALATAN) 0.005 % ophthalmic solution Place 1 drop into both eyes at bedtime.    ? loratadine (CLARITIN) 10 MG tablet Take 10 mg by mouth daily.    ? Multiple Vitamin (MULTIVITAMIN) tablet Take 1 tablet by mouth daily.    ? simvastatin (ZOCOR) 20 MG tablet Take 20 mg by mouth daily at 6 PM.    ? timolol (TIMOPTIC) 0.5 % ophthalmic solution Place 1 drop into both eyes daily.    ? traMADol (ULTRAM) 50 MG tablet Take 1 tablet (50 mg total) by mouth every 6 (six) hours as needed for moderate pain. 56 tablet 0  ? valsartan (DIOVAN) 320 MG tablet Take 320 mg by mouth daily.    ? vitamin C (ASCORBIC ACID) 500 MG tablet Take 500 mg by mouth daily.    ? primidone (MYSOLINE) 50 MG tablet Take 0.5 tablets (25 mg total) by mouth 2 (two) times daily. 90 tablet 4  ? ?No facility-administered medications prior to visit.  ? ? ? ?PAST MEDICAL HISTORY: ?Past Medical History:  ?Diagnosis Date  ? Anemia   ? Arthritis   ? arthritis in knees and hands . Right wrist and thumb splinted due to pain.  ? Blood transfusion   ? 10/12 after knee surgery   ? Blood transfusion without reported diagnosis   ? Essential tremor   ? Glaucoma   ? H/O seasonal allergies   ? sinus congestion  ? Hx of adenomatous polyp of colon 08/18/2014  ? Hypertension   ? Tremor of both hands   ? mild , tx. Primidone  ? ? ? ?PAST  SURGICAL HISTORY: ?Past Surgical History:  ?Procedure Laterality Date  ? ABDOMINAL HYSTERECTOMY    ? Vaginal '88  ? CERVICAL LAMINECTOMY  1995  ? cervical fusion"some limited ROM"  ? CHOLECYSTECTOMY    ? 1999  ? COLONOSCOPY    ? JOINT REPLACEMENT    ? right knee 10/12 , left knee 2006   ? KNEE ARTHROTOMY Right 07/17/2016  ? Procedure: KNEE ARTHROTOMY with scar excision;  Surgeon: Gaynelle Arabian, MD;  Location: [facility];  Service: Orthopedics;  Laterality: Right;  spinal+block  ? KNEE CLOSED REDUCTION  07/10/2011  ? Procedure: CLOSED MANIPULATION KNEE;  Surgeon: Gearlean Alf;  Location:  [facility];  Service: Orthopedics;  Laterality: Right;  ? OTHER SURGICAL HISTORY    ? right knee replacement 8/11   ? ? ? ?FAMILY HISTORY: ?Family History  ?Problem Relation Age of Onset  ? Stroke Mother   ? Parkinsonism Mother   ? Diabetes Mother   ? Colon cancer Neg Hx   ? Esophageal cancer Neg Hx   ? Rectal cancer Neg Hx   ? Stomach cancer Neg Hx   ? ? ? ?SOCIAL HISTORY: ?Social History  ? ?Socioeconomic History  ? Marital status: Married  ?  Spouse name: clifford  ? Number of children: 1  ? Years of education: MA  ? Highest education level: Not on file  ?Occupational History  ? Occupation: retired  ?Tobacco Use  ? Smoking status: Never  ? Smokeless tobacco: Former  ?  Types: Chew  ?  Quit date: 05/08/2014  ?Vaping Use  ? Vaping Use: Never used  ?Substance and Sexual Activity  ? Alcohol use: No  ?  Alcohol/week: 0.0 standard drinks  ? Drug use: No  ? Sexual activity: Not Currently  ?Other Topics Concern  ? Not on file  ?Social History Narrative  ? Patient lives at home with her spouse.  ? Caffeine Use: 1 cup of coffee  ? Patient is right handed.   ? ?Social Determinants of Health  ? ?Financial Resource Strain: Not on file  ?Food Insecurity: Not on file  ?Transportation Needs: Not on file  ?Physical Activity: Not on file  ?Stress: Not on file  ?Social Connections: Not on file  ?Intimate Partner Violence: Not on file  ? ? ? ? ?PHYSICAL  EXAM ? ?Vitals:  ? 09/27/21 1342  ?BP: (!) 178/76  ?Pulse: 74  ?Weight: 150 lb (68 kg)  ?Height: '5\' 4"'$  (1.626 m)  ? ? ?Body mass index is 25.75 kg/m?. ? ? ?Generalized: Well developed, in no acute distress ? ?Cardiology: n

## 2021-10-01 DIAGNOSIS — M545 Low back pain, unspecified: Secondary | ICD-10-CM | POA: Diagnosis not present

## 2021-10-08 DIAGNOSIS — M545 Low back pain, unspecified: Secondary | ICD-10-CM | POA: Diagnosis not present

## 2021-10-08 DIAGNOSIS — M5451 Vertebrogenic low back pain: Secondary | ICD-10-CM | POA: Diagnosis not present

## 2021-10-26 DIAGNOSIS — E782 Mixed hyperlipidemia: Secondary | ICD-10-CM | POA: Diagnosis not present

## 2021-10-26 DIAGNOSIS — N1831 Chronic kidney disease, stage 3a: Secondary | ICD-10-CM | POA: Diagnosis not present

## 2021-10-26 DIAGNOSIS — I10 Essential (primary) hypertension: Secondary | ICD-10-CM | POA: Diagnosis not present

## 2021-11-19 DIAGNOSIS — M47816 Spondylosis without myelopathy or radiculopathy, lumbar region: Secondary | ICD-10-CM | POA: Diagnosis not present

## 2021-11-21 ENCOUNTER — Ambulatory Visit
Admission: RE | Admit: 2021-11-21 | Discharge: 2021-11-21 | Disposition: A | Discharge status: Home / Self Care | Payer: Medicare HMO | Source: Ambulatory Visit | Attending: Nurse Practitioner | Admitting: Nurse Practitioner

## 2021-11-21 DIAGNOSIS — Z780 Asymptomatic menopausal state: Secondary | ICD-10-CM | POA: Diagnosis not present

## 2021-11-21 DIAGNOSIS — M85851 Other specified disorders of bone density and structure, right thigh: Secondary | ICD-10-CM | POA: Diagnosis not present

## 2021-11-29 DIAGNOSIS — N1831 Chronic kidney disease, stage 3a: Secondary | ICD-10-CM | POA: Diagnosis not present

## 2021-11-29 DIAGNOSIS — I10 Essential (primary) hypertension: Secondary | ICD-10-CM | POA: Diagnosis not present

## 2021-11-29 DIAGNOSIS — E782 Mixed hyperlipidemia: Secondary | ICD-10-CM | POA: Diagnosis not present

## 2021-11-29 DIAGNOSIS — M1990 Unspecified osteoarthritis, unspecified site: Secondary | ICD-10-CM | POA: Diagnosis not present

## 2021-12-12 DIAGNOSIS — L668 Other cicatricial alopecia: Secondary | ICD-10-CM | POA: Diagnosis not present

## 2021-12-17 DIAGNOSIS — M545 Low back pain, unspecified: Secondary | ICD-10-CM | POA: Diagnosis not present

## 2021-12-17 DIAGNOSIS — M47816 Spondylosis without myelopathy or radiculopathy, lumbar region: Secondary | ICD-10-CM | POA: Diagnosis not present

## 2021-12-26 ENCOUNTER — Other Ambulatory Visit: Payer: Self-pay | Admitting: Internal Medicine

## 2021-12-26 DIAGNOSIS — Z1231 Encounter for screening mammogram for malignant neoplasm of breast: Secondary | ICD-10-CM

## 2021-12-27 DIAGNOSIS — I10 Essential (primary) hypertension: Secondary | ICD-10-CM | POA: Diagnosis not present

## 2021-12-27 DIAGNOSIS — E782 Mixed hyperlipidemia: Secondary | ICD-10-CM | POA: Diagnosis not present

## 2021-12-27 DIAGNOSIS — N1831 Chronic kidney disease, stage 3a: Secondary | ICD-10-CM | POA: Diagnosis not present

## 2021-12-27 DIAGNOSIS — R7303 Prediabetes: Secondary | ICD-10-CM | POA: Diagnosis not present

## 2021-12-27 DIAGNOSIS — I739 Peripheral vascular disease, unspecified: Secondary | ICD-10-CM | POA: Diagnosis not present

## 2021-12-27 DIAGNOSIS — M5136 Other intervertebral disc degeneration, lumbar region: Secondary | ICD-10-CM | POA: Diagnosis not present

## 2022-01-03 DIAGNOSIS — M47816 Spondylosis without myelopathy or radiculopathy, lumbar region: Secondary | ICD-10-CM | POA: Diagnosis not present

## 2022-01-14 ENCOUNTER — Inpatient Hospital Stay (HOSPITAL_BASED_OUTPATIENT_CLINIC_OR_DEPARTMENT_OTHER): Payer: Medicare HMO | Admitting: Oncology

## 2022-01-14 ENCOUNTER — Other Ambulatory Visit: Payer: Self-pay

## 2022-01-14 ENCOUNTER — Inpatient Hospital Stay: Payer: Medicare HMO | Attending: Oncology

## 2022-01-14 ENCOUNTER — Encounter: Payer: Self-pay | Admitting: Oncology

## 2022-01-14 VITALS — BP 185/68 | HR 90 | Temp 97.9°F | Resp 18 | Ht 62.0 in | Wt 159.2 lb

## 2022-01-14 DIAGNOSIS — D560 Alpha thalassemia: Secondary | ICD-10-CM | POA: Insufficient documentation

## 2022-01-14 DIAGNOSIS — H409 Unspecified glaucoma: Secondary | ICD-10-CM | POA: Diagnosis not present

## 2022-01-14 DIAGNOSIS — I10 Essential (primary) hypertension: Secondary | ICD-10-CM | POA: Diagnosis not present

## 2022-01-14 DIAGNOSIS — R251 Tremor, unspecified: Secondary | ICD-10-CM | POA: Insufficient documentation

## 2022-01-14 DIAGNOSIS — M189 Osteoarthritis of first carpometacarpal joint, unspecified: Secondary | ICD-10-CM | POA: Diagnosis not present

## 2022-01-14 DIAGNOSIS — D509 Iron deficiency anemia, unspecified: Secondary | ICD-10-CM

## 2022-01-14 LAB — CBC WITH DIFFERENTIAL (CANCER CENTER ONLY)
Abs Immature Granulocytes: 0.01 10*3/uL (ref 0.00–0.07)
Basophils Absolute: 0 10*3/uL (ref 0.0–0.1)
Basophils Relative: 0 %
Eosinophils Absolute: 0.2 10*3/uL (ref 0.0–0.5)
Eosinophils Relative: 3 %
HCT: 35.3 % — ABNORMAL LOW (ref 36.0–46.0)
Hemoglobin: 11 g/dL — ABNORMAL LOW (ref 12.0–15.0)
Immature Granulocytes: 0 %
Lymphocytes Relative: 38 %
Lymphs Abs: 2.3 10*3/uL (ref 0.7–4.0)
MCH: 23.1 pg — ABNORMAL LOW (ref 26.0–34.0)
MCHC: 31.2 g/dL (ref 30.0–36.0)
MCV: 74 fL — ABNORMAL LOW (ref 80.0–100.0)
Monocytes Absolute: 0.5 10*3/uL (ref 0.1–1.0)
Monocytes Relative: 9 %
Neutro Abs: 3.1 10*3/uL (ref 1.7–7.7)
Neutrophils Relative %: 50 %
Platelet Count: 206 10*3/uL (ref 150–400)
RBC: 4.77 MIL/uL (ref 3.87–5.11)
RDW: 16.9 % — ABNORMAL HIGH (ref 11.5–15.5)
WBC Count: 6.2 10*3/uL (ref 4.0–10.5)
nRBC: 0 % (ref 0.0–0.2)

## 2022-01-14 NOTE — Addendum Note (Signed)
Addended by: Lenox Ponds E on: 01/14/2022 04:08 PM   Modules accepted: Orders

## 2022-01-14 NOTE — Progress Notes (Signed)
  [facility] OFFICE PROGRESS NOTE   Diagnosis: Microcytic anemia  INTERVAL HISTORY:   Ms. Bagent returns as scheduled.  She reports malaise.  She has "arthritis "in the right thumb.  No other complaint.  No dyspnea.  Good appetite.  Objective:  Vital signs in last 24 hours:  Blood pressure (!) 185/68, pulse 90, temperature 97.9 F (36.6 C), temperature source Oral, resp. rate 18, height '5\' 2"'$  (1.575 m), weight 159 lb 3.2 oz (72.2 kg), SpO2 100 %.    Resp: Fine end inspiratory rales at the lower posterior chest bilaterally, no respiratory distress Cardio: Regular rate and rhythm, 2/6 systolic murmur GI: No hepatosplenomegaly Vascular: No leg edema   Lab Results:  Lab Results  Component Value Date   WBC 6.2 01/14/2022   HGB 11.0 (L) 01/14/2022   HCT 35.3 (L) 01/14/2022   MCV 74.0 (L) 01/14/2022   PLT 206 01/14/2022   NEUTROABS 3.1 01/14/2022    CMP  Lab Results  Component Value Date   NA 141 07/11/2016   K 3.7 07/11/2016   CL 101 07/11/2016   CO2 32 07/11/2016   GLUCOSE 116 (H) 07/11/2016   BUN 15 07/11/2016   CREATININE 0.98 07/17/2016   CALCIUM 8.8 (L) 07/11/2016   PROT 7.8 04/26/2011   ALBUMIN 4.0 04/26/2011   AST 23 04/26/2011   ALT 15 04/26/2011   ALKPHOS 75 04/26/2011   BILITOT 0.2 (L) 04/26/2011   GFRNONAA 55 (L) 07/17/2016   GFRAA >60 07/17/2016     Medications: I have reviewed the patient's current medications.   Assessment/Plan: Microcytic anemia Hemoglobin electrophoresis 04/30/2021 consistent with beta thalassemia minor Alpha thalassemia DNA analysis 04/30/2021 consistent with alpha thalassemia trait Mild neutropenia 04/30/2021, normal on 05/14/2021 and 09/17/2021 Hypertension Osteoarthritis Creatinine 1.4 on 03/28/2021 Tremor Glaucoma Mild elevation of kappa and lambda free light chains 04/30/2021    Disposition:  Jenna Howe has chronic microcytic anemia.  The anemia does not appear to be related to iron deficiency.   She has alpha thalassemia trait, but this should not cause significant anemia.  We requested beta globin gene testing, but this was not done.  We will make sure this is ordered today.  She will return for an office and lab visit in 6 months.  She will alert family members of the alpha thalassemia diagnosis.  Betsy Coder, MD  01/14/2022  3:06 PM

## 2022-01-14 NOTE — Addendum Note (Signed)
Addended by: Lenox Ponds E on: 01/14/2022 03:58 PM   Modules accepted: Orders

## 2022-01-15 ENCOUNTER — Other Ambulatory Visit: Payer: Self-pay | Admitting: *Deleted

## 2022-01-16 ENCOUNTER — Inpatient Hospital Stay: Payer: Medicare HMO

## 2022-01-25 LAB — MISC LABCORP TEST (SEND OUT): Labcorp test code: 252823

## 2022-01-28 ENCOUNTER — Ambulatory Visit
Admission: RE | Admit: 2022-01-28 | Discharge: 2022-01-28 | Disposition: A | Discharge status: Home / Self Care | Payer: Medicare HMO | Source: Ambulatory Visit | Attending: Internal Medicine | Admitting: Internal Medicine

## 2022-01-28 DIAGNOSIS — Z1231 Encounter for screening mammogram for malignant neoplasm of breast: Secondary | ICD-10-CM | POA: Diagnosis not present

## 2022-02-05 DIAGNOSIS — M47816 Spondylosis without myelopathy or radiculopathy, lumbar region: Secondary | ICD-10-CM | POA: Diagnosis not present

## 2022-02-06 DIAGNOSIS — I129 Hypertensive chronic kidney disease with stage 1 through stage 4 chronic kidney disease, or unspecified chronic kidney disease: Secondary | ICD-10-CM | POA: Diagnosis not present

## 2022-02-06 DIAGNOSIS — N1831 Chronic kidney disease, stage 3a: Secondary | ICD-10-CM | POA: Diagnosis not present

## 2022-02-19 DIAGNOSIS — M545 Low back pain, unspecified: Secondary | ICD-10-CM | POA: Diagnosis not present

## 2022-02-19 DIAGNOSIS — M47817 Spondylosis without myelopathy or radiculopathy, lumbosacral region: Secondary | ICD-10-CM | POA: Diagnosis not present

## 2022-02-20 ENCOUNTER — Telehealth: Payer: Self-pay | Admitting: Family Medicine

## 2022-02-20 ENCOUNTER — Other Ambulatory Visit: Payer: Self-pay | Admitting: Internal Medicine

## 2022-02-20 NOTE — Telephone Encounter (Signed)
LVM and sent mychart msg informing pt of appointment change from 3/28 to 3/27 due to Amy NP being out

## 2022-02-26 ENCOUNTER — Other Ambulatory Visit: Payer: Self-pay | Admitting: Internal Medicine

## 2022-02-26 DIAGNOSIS — Z1231 Encounter for screening mammogram for malignant neoplasm of breast: Secondary | ICD-10-CM

## 2022-02-27 DIAGNOSIS — N1831 Chronic kidney disease, stage 3a: Secondary | ICD-10-CM | POA: Diagnosis not present

## 2022-02-27 DIAGNOSIS — E782 Mixed hyperlipidemia: Secondary | ICD-10-CM | POA: Diagnosis not present

## 2022-02-27 DIAGNOSIS — I10 Essential (primary) hypertension: Secondary | ICD-10-CM | POA: Diagnosis not present

## 2022-03-06 DIAGNOSIS — M47816 Spondylosis without myelopathy or radiculopathy, lumbar region: Secondary | ICD-10-CM | POA: Diagnosis not present

## 2022-03-18 DIAGNOSIS — H401133 Primary open-angle glaucoma, bilateral, severe stage: Secondary | ICD-10-CM | POA: Diagnosis not present

## 2022-03-26 DIAGNOSIS — M47817 Spondylosis without myelopathy or radiculopathy, lumbosacral region: Secondary | ICD-10-CM | POA: Diagnosis not present

## 2022-03-26 DIAGNOSIS — M545 Low back pain, unspecified: Secondary | ICD-10-CM | POA: Diagnosis not present

## 2022-03-26 DIAGNOSIS — Z79891 Long term (current) use of opiate analgesic: Secondary | ICD-10-CM | POA: Diagnosis not present

## 2022-04-01 DIAGNOSIS — H401132 Primary open-angle glaucoma, bilateral, moderate stage: Secondary | ICD-10-CM | POA: Diagnosis not present

## 2022-04-02 DIAGNOSIS — D509 Iron deficiency anemia, unspecified: Secondary | ICD-10-CM | POA: Diagnosis not present

## 2022-04-02 DIAGNOSIS — I739 Peripheral vascular disease, unspecified: Secondary | ICD-10-CM | POA: Diagnosis not present

## 2022-04-02 DIAGNOSIS — R7303 Prediabetes: Secondary | ICD-10-CM | POA: Diagnosis not present

## 2022-04-02 DIAGNOSIS — Z0000 Encounter for general adult medical examination without abnormal findings: Secondary | ICD-10-CM | POA: Diagnosis not present

## 2022-04-02 DIAGNOSIS — D563 Thalassemia minor: Secondary | ICD-10-CM | POA: Diagnosis not present

## 2022-04-02 DIAGNOSIS — Z1331 Encounter for screening for depression: Secondary | ICD-10-CM | POA: Diagnosis not present

## 2022-04-02 DIAGNOSIS — E782 Mixed hyperlipidemia: Secondary | ICD-10-CM | POA: Diagnosis not present

## 2022-04-02 DIAGNOSIS — N1831 Chronic kidney disease, stage 3a: Secondary | ICD-10-CM | POA: Diagnosis not present

## 2022-04-02 DIAGNOSIS — G250 Essential tremor: Secondary | ICD-10-CM | POA: Diagnosis not present

## 2022-04-02 DIAGNOSIS — I10 Essential (primary) hypertension: Secondary | ICD-10-CM | POA: Diagnosis not present

## 2022-04-02 DIAGNOSIS — Z23 Encounter for immunization: Secondary | ICD-10-CM | POA: Diagnosis not present

## 2022-04-23 DIAGNOSIS — M4726 Other spondylosis with radiculopathy, lumbar region: Secondary | ICD-10-CM | POA: Diagnosis not present

## 2022-04-23 DIAGNOSIS — Z79891 Long term (current) use of opiate analgesic: Secondary | ICD-10-CM | POA: Diagnosis not present

## 2022-04-23 DIAGNOSIS — M47816 Spondylosis without myelopathy or radiculopathy, lumbar region: Secondary | ICD-10-CM | POA: Diagnosis not present

## 2022-04-23 DIAGNOSIS — M545 Low back pain, unspecified: Secondary | ICD-10-CM | POA: Diagnosis not present

## 2022-05-08 DIAGNOSIS — M5416 Radiculopathy, lumbar region: Secondary | ICD-10-CM | POA: Diagnosis not present

## 2022-05-21 DIAGNOSIS — M47816 Spondylosis without myelopathy or radiculopathy, lumbar region: Secondary | ICD-10-CM | POA: Diagnosis not present

## 2022-05-21 DIAGNOSIS — M545 Low back pain, unspecified: Secondary | ICD-10-CM | POA: Diagnosis not present

## 2022-05-21 DIAGNOSIS — Z79891 Long term (current) use of opiate analgesic: Secondary | ICD-10-CM | POA: Diagnosis not present

## 2022-05-21 DIAGNOSIS — M4726 Other spondylosis with radiculopathy, lumbar region: Secondary | ICD-10-CM | POA: Diagnosis not present

## 2022-05-21 DIAGNOSIS — M791 Myalgia, unspecified site: Secondary | ICD-10-CM | POA: Diagnosis not present

## 2022-05-28 DIAGNOSIS — I701 Atherosclerosis of renal artery: Secondary | ICD-10-CM | POA: Diagnosis not present

## 2022-05-28 DIAGNOSIS — M1990 Unspecified osteoarthritis, unspecified site: Secondary | ICD-10-CM | POA: Diagnosis not present

## 2022-05-28 DIAGNOSIS — E782 Mixed hyperlipidemia: Secondary | ICD-10-CM | POA: Diagnosis not present

## 2022-05-28 DIAGNOSIS — N1831 Chronic kidney disease, stage 3a: Secondary | ICD-10-CM | POA: Diagnosis not present

## 2022-05-28 DIAGNOSIS — I10 Essential (primary) hypertension: Secondary | ICD-10-CM | POA: Diagnosis not present

## 2022-06-03 ENCOUNTER — Ambulatory Visit (INDEPENDENT_AMBULATORY_CARE_PROVIDER_SITE_OTHER): Payer: Medicare HMO | Admitting: Podiatry

## 2022-06-03 ENCOUNTER — Encounter: Payer: Self-pay | Admitting: Podiatry

## 2022-06-03 DIAGNOSIS — M722 Plantar fascial fibromatosis: Secondary | ICD-10-CM | POA: Diagnosis not present

## 2022-06-03 MED ORDER — TRIAMCINOLONE ACETONIDE 10 MG/ML IJ SUSP
20.0000 mg | Freq: Once | INTRAMUSCULAR | Status: AC
  Administered 2022-06-03: 20 mg

## 2022-06-04 NOTE — Progress Notes (Signed)
Subjective:   Patient ID: Jenna Howe, female   DOB: 81 y.o.   MRN: [id number]   HPI Patient presents with caregiver with inflammation of the plantar heel right and the left and also some keratotic tissue which forms   ROS      Objective:  Physical Exam  Neurovascular status intact with inflammation more around the medial side of the right plantar fascia lateral side of the left plantar fascia with a low-grade keratotic tissue formation     Assessment:  Combination of stress from pressure along with inflammatory condition bilateral heel     Plan:  H&P reviewed condition went ahead today and I did do plantar medial injection right 3 mg dexamethasone Kenalog and plantar lateral injection left 3 mg Kenalog 5 mg Xylocaine and advised on wearing and using pillows to lift the heels off the ground so there is no pressure.  Reappoint to recheck as needed

## 2022-06-05 ENCOUNTER — Ambulatory Visit
Admission: RE | Admit: 2022-06-05 | Discharge: 2022-06-05 | Disposition: A | Discharge status: Home / Self Care | Payer: Medicare HMO | Source: Ambulatory Visit | Attending: Physician Assistant | Admitting: Physician Assistant

## 2022-06-05 ENCOUNTER — Other Ambulatory Visit: Payer: Self-pay | Admitting: Physician Assistant

## 2022-06-05 DIAGNOSIS — R0781 Pleurodynia: Secondary | ICD-10-CM

## 2022-06-11 DIAGNOSIS — R262 Difficulty in walking, not elsewhere classified: Secondary | ICD-10-CM | POA: Diagnosis not present

## 2022-06-11 DIAGNOSIS — M24559 Contracture, unspecified hip: Secondary | ICD-10-CM | POA: Diagnosis not present

## 2022-06-11 DIAGNOSIS — M5459 Other low back pain: Secondary | ICD-10-CM | POA: Diagnosis not present

## 2022-06-13 DIAGNOSIS — M24559 Contracture, unspecified hip: Secondary | ICD-10-CM | POA: Diagnosis not present

## 2022-06-13 DIAGNOSIS — R262 Difficulty in walking, not elsewhere classified: Secondary | ICD-10-CM | POA: Diagnosis not present

## 2022-06-13 DIAGNOSIS — M5459 Other low back pain: Secondary | ICD-10-CM | POA: Diagnosis not present

## 2022-06-18 DIAGNOSIS — M4726 Other spondylosis with radiculopathy, lumbar region: Secondary | ICD-10-CM | POA: Diagnosis not present

## 2022-06-18 DIAGNOSIS — M791 Myalgia, unspecified site: Secondary | ICD-10-CM | POA: Diagnosis not present

## 2022-06-18 DIAGNOSIS — M47816 Spondylosis without myelopathy or radiculopathy, lumbar region: Secondary | ICD-10-CM | POA: Diagnosis not present

## 2022-06-18 DIAGNOSIS — M545 Low back pain, unspecified: Secondary | ICD-10-CM | POA: Diagnosis not present

## 2022-06-18 DIAGNOSIS — Z79891 Long term (current) use of opiate analgesic: Secondary | ICD-10-CM | POA: Diagnosis not present

## 2022-06-19 DIAGNOSIS — R262 Difficulty in walking, not elsewhere classified: Secondary | ICD-10-CM | POA: Diagnosis not present

## 2022-06-19 DIAGNOSIS — M5459 Other low back pain: Secondary | ICD-10-CM | POA: Diagnosis not present

## 2022-06-19 DIAGNOSIS — M24559 Contracture, unspecified hip: Secondary | ICD-10-CM | POA: Diagnosis not present

## 2022-06-21 DIAGNOSIS — R262 Difficulty in walking, not elsewhere classified: Secondary | ICD-10-CM | POA: Diagnosis not present

## 2022-06-21 DIAGNOSIS — M5459 Other low back pain: Secondary | ICD-10-CM | POA: Diagnosis not present

## 2022-06-21 DIAGNOSIS — M24559 Contracture, unspecified hip: Secondary | ICD-10-CM | POA: Diagnosis not present

## 2022-06-25 DIAGNOSIS — R262 Difficulty in walking, not elsewhere classified: Secondary | ICD-10-CM | POA: Diagnosis not present

## 2022-06-25 DIAGNOSIS — M24559 Contracture, unspecified hip: Secondary | ICD-10-CM | POA: Diagnosis not present

## 2022-06-25 DIAGNOSIS — M5459 Other low back pain: Secondary | ICD-10-CM | POA: Diagnosis not present

## 2022-06-27 DIAGNOSIS — M24559 Contracture, unspecified hip: Secondary | ICD-10-CM | POA: Diagnosis not present

## 2022-06-27 DIAGNOSIS — M5459 Other low back pain: Secondary | ICD-10-CM | POA: Diagnosis not present

## 2022-06-27 DIAGNOSIS — R262 Difficulty in walking, not elsewhere classified: Secondary | ICD-10-CM | POA: Diagnosis not present

## 2022-07-16 DIAGNOSIS — E782 Mixed hyperlipidemia: Secondary | ICD-10-CM | POA: Diagnosis not present

## 2022-07-16 DIAGNOSIS — H409 Unspecified glaucoma: Secondary | ICD-10-CM | POA: Diagnosis not present

## 2022-07-16 DIAGNOSIS — I10 Essential (primary) hypertension: Secondary | ICD-10-CM | POA: Diagnosis not present

## 2022-07-16 DIAGNOSIS — N1831 Chronic kidney disease, stage 3a: Secondary | ICD-10-CM | POA: Diagnosis not present

## 2022-07-16 DIAGNOSIS — M1990 Unspecified osteoarthritis, unspecified site: Secondary | ICD-10-CM | POA: Diagnosis not present

## 2022-07-17 ENCOUNTER — Encounter: Payer: Self-pay | Admitting: Nurse Practitioner

## 2022-07-17 ENCOUNTER — Inpatient Hospital Stay: Payer: Medicare HMO | Attending: Oncology

## 2022-07-17 ENCOUNTER — Inpatient Hospital Stay (HOSPITAL_BASED_OUTPATIENT_CLINIC_OR_DEPARTMENT_OTHER): Payer: Medicare HMO | Admitting: Nurse Practitioner

## 2022-07-17 VITALS — BP 162/76 | HR 99 | Temp 97.9°F | Resp 20 | Ht 62.0 in | Wt 155.2 lb

## 2022-07-17 DIAGNOSIS — D509 Iron deficiency anemia, unspecified: Secondary | ICD-10-CM

## 2022-07-17 DIAGNOSIS — M549 Dorsalgia, unspecified: Secondary | ICD-10-CM | POA: Insufficient documentation

## 2022-07-17 DIAGNOSIS — R251 Tremor, unspecified: Secondary | ICD-10-CM | POA: Insufficient documentation

## 2022-07-17 DIAGNOSIS — I10 Essential (primary) hypertension: Secondary | ICD-10-CM | POA: Insufficient documentation

## 2022-07-17 DIAGNOSIS — G8929 Other chronic pain: Secondary | ICD-10-CM | POA: Insufficient documentation

## 2022-07-17 DIAGNOSIS — H409 Unspecified glaucoma: Secondary | ICD-10-CM | POA: Insufficient documentation

## 2022-07-17 DIAGNOSIS — M1990 Unspecified osteoarthritis, unspecified site: Secondary | ICD-10-CM | POA: Diagnosis not present

## 2022-07-17 LAB — CBC WITH DIFFERENTIAL (CANCER CENTER ONLY)
Abs Immature Granulocytes: 0.01 10*3/uL (ref 0.00–0.07)
Basophils Absolute: 0 10*3/uL (ref 0.0–0.1)
Basophils Relative: 0 %
Eosinophils Absolute: 0.4 10*3/uL (ref 0.0–0.5)
Eosinophils Relative: 5 %
HCT: 36.3 % (ref 36.0–46.0)
Hemoglobin: 10.9 g/dL — ABNORMAL LOW (ref 12.0–15.0)
Immature Granulocytes: 0 %
Lymphocytes Relative: 34 %
Lymphs Abs: 2.3 10*3/uL (ref 0.7–4.0)
MCH: 22.3 pg — ABNORMAL LOW (ref 26.0–34.0)
MCHC: 30 g/dL (ref 30.0–36.0)
MCV: 74.2 fL — ABNORMAL LOW (ref 80.0–100.0)
Monocytes Absolute: 0.5 10*3/uL (ref 0.1–1.0)
Monocytes Relative: 7 %
Neutro Abs: 3.7 10*3/uL (ref 1.7–7.7)
Neutrophils Relative %: 54 %
Platelet Count: 241 10*3/uL (ref 150–400)
RBC: 4.89 MIL/uL (ref 3.87–5.11)
RDW: 16.1 % — ABNORMAL HIGH (ref 11.5–15.5)
WBC Count: 6.9 10*3/uL (ref 4.0–10.5)
nRBC: 0 % (ref 0.0–0.2)

## 2022-07-17 NOTE — Progress Notes (Signed)
  [place] OFFICE PROGRESS NOTE   Diagnosis: Microcytic anemia  INTERVAL HISTORY:   Jenna Howe returns as scheduled.  She continues to have fatigue/malaise.  She has arthritis pain in her hands.  Stable chronic back pain.  No bleeding.  No shortness of breath.  Objective:  Vital signs in last 24 hours:  Blood pressure (!) 162/76, pulse 99, temperature 97.9 F (36.6 C), temperature source Oral, resp. rate 20, height '5\' 2"'$  (1.575 m), weight 155 lb 3.2 oz (70.4 kg), SpO2 100 %.     Resp: Lungs clear bilaterally. Cardio: Regular rate and rhythm.  2/6 systolic murmur. GI: Abdomen soft and nontender.  No hepatosplenomegaly. Vascular: Trace bilateral ankle/pedal edema.   Lab Results:  Lab Results  Component Value Date   WBC 6.9 07/17/2022   HGB 10.9 (L) 07/17/2022   HCT 36.3 07/17/2022   MCV 74.2 (L) 07/17/2022   PLT 241 07/17/2022   NEUTROABS 3.7 07/17/2022    Imaging:  No results found.  Medications: I have reviewed the patient's current medications.  Assessment/Plan: Microcytic anemia Hemoglobin electrophoresis 04/30/2021 consistent with beta thalassemia minor Alpha thalassemia DNA analysis 04/30/2021 consistent with alpha thalassemia trait Beta globin gene testing 01/16/2022-result not currently available Mild neutropenia 04/30/2021, normal on 05/14/2021 and 09/17/2021 Hypertension Osteoarthritis Creatinine 1.4 on 03/28/2021 Tremor Glaucoma Mild elevation of kappa and lambda free light chains 04/30/2021  Disposition: Jenna Howe has a chronic microcytic anemia.  She has alpha thalassemia trait.  Beta globin gene testing has been completed but the result is not available to [place] today.  We will follow-up on this and contact her.  Hemoglobin is stable.  She will return for lab and follow-up in 6 months.    Ned Card ANP/GNP-BC   07/17/2022  2:40 PM

## 2022-07-19 DIAGNOSIS — H524 Presbyopia: Secondary | ICD-10-CM | POA: Diagnosis not present

## 2022-07-19 DIAGNOSIS — H401432 Capsular glaucoma with pseudoexfoliation of lens, bilateral, moderate stage: Secondary | ICD-10-CM | POA: Diagnosis not present

## 2022-07-19 DIAGNOSIS — H25813 Combined forms of age-related cataract, bilateral: Secondary | ICD-10-CM | POA: Diagnosis not present

## 2022-07-30 DIAGNOSIS — Z79891 Long term (current) use of opiate analgesic: Secondary | ICD-10-CM | POA: Diagnosis not present

## 2022-07-30 DIAGNOSIS — M4726 Other spondylosis with radiculopathy, lumbar region: Secondary | ICD-10-CM | POA: Diagnosis not present

## 2022-07-30 DIAGNOSIS — M47816 Spondylosis without myelopathy or radiculopathy, lumbar region: Secondary | ICD-10-CM | POA: Diagnosis not present

## 2022-07-30 DIAGNOSIS — M545 Low back pain, unspecified: Secondary | ICD-10-CM | POA: Diagnosis not present

## 2022-07-30 DIAGNOSIS — N1831 Chronic kidney disease, stage 3a: Secondary | ICD-10-CM | POA: Diagnosis not present

## 2022-07-30 DIAGNOSIS — M791 Myalgia, unspecified site: Secondary | ICD-10-CM | POA: Diagnosis not present

## 2022-08-08 DIAGNOSIS — N1831 Chronic kidney disease, stage 3a: Secondary | ICD-10-CM | POA: Diagnosis not present

## 2022-08-08 DIAGNOSIS — I129 Hypertensive chronic kidney disease with stage 1 through stage 4 chronic kidney disease, or unspecified chronic kidney disease: Secondary | ICD-10-CM | POA: Diagnosis not present

## 2022-08-21 DIAGNOSIS — M7732 Calcaneal spur, left foot: Secondary | ICD-10-CM | POA: Diagnosis not present

## 2022-08-21 DIAGNOSIS — M7731 Calcaneal spur, right foot: Secondary | ICD-10-CM | POA: Diagnosis not present

## 2022-08-21 DIAGNOSIS — M71571 Other bursitis, not elsewhere classified, right ankle and foot: Secondary | ICD-10-CM | POA: Diagnosis not present

## 2022-08-21 DIAGNOSIS — M722 Plantar fascial fibromatosis: Secondary | ICD-10-CM | POA: Diagnosis not present

## 2022-08-21 DIAGNOSIS — M71572 Other bursitis, not elsewhere classified, left ankle and foot: Secondary | ICD-10-CM | POA: Diagnosis not present

## 2022-08-26 DIAGNOSIS — E782 Mixed hyperlipidemia: Secondary | ICD-10-CM | POA: Diagnosis not present

## 2022-08-26 DIAGNOSIS — M1990 Unspecified osteoarthritis, unspecified site: Secondary | ICD-10-CM | POA: Diagnosis not present

## 2022-08-26 DIAGNOSIS — D509 Iron deficiency anemia, unspecified: Secondary | ICD-10-CM | POA: Diagnosis not present

## 2022-08-26 DIAGNOSIS — H409 Unspecified glaucoma: Secondary | ICD-10-CM | POA: Diagnosis not present

## 2022-08-26 DIAGNOSIS — N1831 Chronic kidney disease, stage 3a: Secondary | ICD-10-CM | POA: Diagnosis not present

## 2022-08-26 DIAGNOSIS — I10 Essential (primary) hypertension: Secondary | ICD-10-CM | POA: Diagnosis not present

## 2022-08-29 DIAGNOSIS — M71572 Other bursitis, not elsewhere classified, left ankle and foot: Secondary | ICD-10-CM | POA: Diagnosis not present

## 2022-08-29 DIAGNOSIS — M722 Plantar fascial fibromatosis: Secondary | ICD-10-CM | POA: Diagnosis not present

## 2022-09-10 DIAGNOSIS — H401132 Primary open-angle glaucoma, bilateral, moderate stage: Secondary | ICD-10-CM | POA: Diagnosis not present

## 2022-09-10 DIAGNOSIS — H25813 Combined forms of age-related cataract, bilateral: Secondary | ICD-10-CM | POA: Diagnosis not present

## 2022-09-24 DIAGNOSIS — M4726 Other spondylosis with radiculopathy, lumbar region: Secondary | ICD-10-CM | POA: Diagnosis not present

## 2022-09-24 DIAGNOSIS — Z79891 Long term (current) use of opiate analgesic: Secondary | ICD-10-CM | POA: Diagnosis not present

## 2022-09-24 DIAGNOSIS — M47816 Spondylosis without myelopathy or radiculopathy, lumbar region: Secondary | ICD-10-CM | POA: Diagnosis not present

## 2022-09-24 DIAGNOSIS — M791 Myalgia, unspecified site: Secondary | ICD-10-CM | POA: Diagnosis not present

## 2022-09-24 DIAGNOSIS — M545 Low back pain, unspecified: Secondary | ICD-10-CM | POA: Diagnosis not present

## 2022-09-30 DIAGNOSIS — N1832 Chronic kidney disease, stage 3b: Secondary | ICD-10-CM | POA: Diagnosis not present

## 2022-09-30 DIAGNOSIS — D563 Thalassemia minor: Secondary | ICD-10-CM | POA: Diagnosis not present

## 2022-09-30 DIAGNOSIS — G8929 Other chronic pain: Secondary | ICD-10-CM | POA: Diagnosis not present

## 2022-09-30 DIAGNOSIS — I739 Peripheral vascular disease, unspecified: Secondary | ICD-10-CM | POA: Diagnosis not present

## 2022-09-30 DIAGNOSIS — R7303 Prediabetes: Secondary | ICD-10-CM | POA: Diagnosis not present

## 2022-09-30 DIAGNOSIS — I10 Essential (primary) hypertension: Secondary | ICD-10-CM | POA: Diagnosis not present

## 2022-09-30 DIAGNOSIS — E782 Mixed hyperlipidemia: Secondary | ICD-10-CM | POA: Diagnosis not present

## 2022-09-30 DIAGNOSIS — M5136 Other intervertebral disc degeneration, lumbar region: Secondary | ICD-10-CM | POA: Diagnosis not present

## 2022-09-30 DIAGNOSIS — G250 Essential tremor: Secondary | ICD-10-CM | POA: Diagnosis not present

## 2022-10-01 DIAGNOSIS — M47816 Spondylosis without myelopathy or radiculopathy, lumbar region: Secondary | ICD-10-CM | POA: Diagnosis not present

## 2022-10-02 ENCOUNTER — Ambulatory Visit (INDEPENDENT_AMBULATORY_CARE_PROVIDER_SITE_OTHER): Payer: Medicare HMO | Admitting: Family Medicine

## 2022-10-02 ENCOUNTER — Encounter: Payer: Self-pay | Admitting: Family Medicine

## 2022-10-02 VITALS — BP 140/72 | HR 95 | Ht 64.0 in | Wt 159.2 lb

## 2022-10-02 DIAGNOSIS — G250 Essential tremor: Secondary | ICD-10-CM | POA: Diagnosis not present

## 2022-10-02 MED ORDER — PRIMIDONE 50 MG PO TABS: 25.0000 mg | ORAL_TABLET | Freq: Two times a day (BID) | ORAL | 4 refills | Status: DC

## 2022-10-02 NOTE — Patient Instructions (Signed)
Below is our plan:  We will continue primidone 25mg  twice daily   Please make sure you are staying well hydrated. I recommend 50-60 ounces daily. Well balanced diet and regular exercise encouraged. Consistent sleep schedule with 6-8 hours recommended.   Please continue follow up with care team as directed.   Follow up with me in 1 year   You may receive a survey regarding today's visit. I encourage you to leave honest feed back as I do use this information to improve patient care. Thank you for seeing me today!

## 2022-10-02 NOTE — Progress Notes (Signed)
Chief Complaint  Patient presents with   Room 16    Pt is here Alone. Pt states that things have been going well since last appointment. Pt states no new changes or symptoms since last appointment.    HISTORY OF PRESENT ILLNESS:  10/02/22 ALL:  Jenna Howe returns for follow up for tremor. She continues primidone 25mg  BID. She is tolerating it well. She reports tremor is stable. No significant change since last year. Pharmacy cuts tablet for her. She is having significant back pain followed by PCP. She reports gabapentin caused significant muscle jerks. She is working with PCP to get a walker. She feels BP was elevated due to walking to the back of the office. Initially 190 sys. After visit 140/72. 131/66 at PCP visit, yesterday. Usually runs around 120s/50-60's at home.   09/27/2021 ALL: Jenna Howe returns for follow up for tremor. She continues primidone 25mg  BID. She continues to do very well. Tremor is mild and not limiting. She is tolerating meds well. BP is usually fairly normal at home. She has a log and has appt with PCP today at 2:45. She is feeling well and without concerns.   09/21/2020 ALL:  Jenna Howe is a 82 y.o. female here today for follow up for tremors. She continues primidone 25mg  twice daily and tolerating well. Tremor is well managed. She notices it most in left hand when anxious or in a hurry. No resting tremor. No difficulty swallowing, She has been having back pain and right sided leg pain. She is followed closely by PCP. She reports BP checked every other day at home and usually 110-130/70's.   HISTORY (copied from Dr Gladstone Lighter previous note)  UPDATE (06/21/19, VRP): Since last visit, doing well. Tremors are improved. No new issues. Tolerating primidone 25mg  twice a day.      UPDATE (03/26/17, VRP): Since last visit, doing well. Tolerating primidone 25mg  twice a day. No alleviating or aggravating factors.    UPDATE (03/25/2016, MM): Jenna Howe is a 82 year old  female with a history of essential tremor. She returns today for follow-up. She is currently taking primidone 25 mg twice a day. She reports that this controls her tremor quite well. She is able to eat and dress herself without any difficulty. She states that stress is a trigger for her tremor. She feels that her tremor has improved. She denies any new neurological symptoms. She returns today for an evaluation.   UPDATE (03/27/15, MM): Jenna Howe is a 82 year old female with a history of essential tremor. She returns today for follow-up. She is currently taking primidone 25 mg twice a day. She reports that her tremor is under good control. She states that if she is under a lot of stress or in a rush it'll become more prevalent. The tremor primarily affects the left hand. She denies any new neurological symptoms. She returns today for an evaluation   PRIOR HPI (01/23/11, VRP): 82 year old right-handed female with hypertension, history of cervical laminectomy and discectomy, arthritis, bilateral total knee replacements, here for evaluation of tremor since Jan 2012. The patient reports gradual, progressive tremor of bilateral upper extremities (left greater than right), mainly when she is doing specific tasks such as applying makeup, putting on earrings, using a spoon or drinking from a glass. She denies any tremor at rest. She denies any new smell or taste difficulties, sleep disturbances or walking problems. She denies any swallowing difficulties. She has a brother with Parkinson's disease. No other family history of tremor  REVIEW OF SYSTEMS: Out of a complete 14 system review of symptoms, the patient complains only of the following symptoms, tremor, back pain, leg swelling, and all other reviewed systems are negative.   ALLERGIES: Allergies  Allergen Reactions   Clinoril [Sulindac]     Stomach upset   Gabapentin Other (See Comments)    Muscle jerking    Sulfamethoxazole-Trimethoprim     Other  reaction(s): rash   Tizanidine     Other reaction(s): itch   Celebrex [Celecoxib] Rash    Stomach upset   Penicillins Rash    Has patient had a PCN reaction causing immediate rash, facial/tongue/throat swelling, SOB or lightheadedness with hypotension:Yes Has patient had a PCN reaction causing severe rash involving mucus membranes or skin necrosis: No Has patient had a PCN reaction that required hospitalization No Has patient had a PCN reaction occurring within the last 10 years: No If all of the above answers are "NO", then may proceed with Cephalosporin use.    Sulfa Antibiotics Rash   Sulfur Rash     HOME MEDICATIONS: Outpatient Medications Prior to Visit  Medication Sig Dispense Refill   amLODipine (NORVASC) 5 MG tablet Take 5 mg by mouth daily.     BIOTIN 5000 PO Take 5,000 mcg by mouth daily.      bumetanide (BUMEX) 1 MG tablet Take 1 mg by mouth daily.     Calcium Citrate-Vitamin D (CITRACAL + D PO) Take 1 tablet by mouth 2 (two) times daily.     Cholecalciferol (VITAMIN D3) 1000 UNITS CAPS Take 1,000 Units by mouth daily.      clindamycin (CLEOCIN) 150 MG capsule Take 4 capsules orally prior to dental procedure.     cyclobenzaprine (FLEXERIL) 10 MG tablet Take 10 mg by mouth 3 (three) times daily.     doxazosin (CARDURA) 4 MG tablet Take 4 mg by mouth daily.     fish oil-omega-3 fatty acids 1000 MG capsule Take 1 g by mouth 2 (two) times daily.      HYDROcodone-acetaminophen (NORCO/VICODIN) 5-325 MG tablet 1 tablet Orally Up to 4 times daily as needed for severe pain for 30 days     latanoprost (XALATAN) 0.005 % ophthalmic solution Place 1 drop into both eyes at bedtime.     loratadine (CLARITIN) 10 MG tablet Take 10 mg by mouth daily.     Multiple Vitamin (MULTIVITAMIN) tablet Take 1 tablet by mouth daily.     simvastatin (ZOCOR) 20 MG tablet Take 20 mg by mouth daily at 6 PM.     timolol (TIMOPTIC) 0.5 % ophthalmic solution Place 1 drop into both eyes daily.     traMADol  (ULTRAM) 50 MG tablet Take 1 tablet (50 mg total) by mouth every 6 (six) hours as needed for moderate pain. 56 tablet 0   valsartan (DIOVAN) 320 MG tablet Take 320 mg by mouth daily.     vitamin C (ASCORBIC ACID) 500 MG tablet Take 500 mg by mouth daily.     gabapentin (NEURONTIN) 100 MG capsule PLEASE SEE ATTACHED FOR DETAILED DIRECTIONS     primidone (MYSOLINE) 50 MG tablet Take 0.5 tablets (25 mg total) by mouth 2 (two) times daily. 90 tablet 4   No facility-administered medications prior to visit.     PAST MEDICAL HISTORY: Past Medical History:  Diagnosis Date   Anemia    Arthritis    arthritis in knees and hands . Right wrist and thumb splinted due to pain.   Blood transfusion  10/12 after knee surgery    Blood transfusion without reported diagnosis    Essential tremor    Glaucoma    H/O seasonal allergies    sinus congestion   Hx of adenomatous polyp of colon 08/18/2014   Hypertension    Tremor of both hands    mild , tx. Primidone     PAST SURGICAL HISTORY: Past Surgical History:  Procedure Laterality Date   ABDOMINAL HYSTERECTOMY     Vaginal '88   CERVICAL LAMINECTOMY  1995   cervical fusion"some limited ROM"   CHOLECYSTECTOMY     1999   COLONOSCOPY     JOINT REPLACEMENT     right knee 10/12 , left knee 2006    KNEE ARTHROTOMY Right 07/17/2016   Procedure: KNEE ARTHROTOMY with scar excision;  Surgeon: Gaynelle Arabian, MD;  Location: [facility];  Service: Orthopedics;  Laterality: Right;  spinal+block   KNEE CLOSED REDUCTION  07/10/2011   Procedure: CLOSED MANIPULATION KNEE;  Surgeon: Gearlean Alf;  Location: [facility];  Service: Orthopedics;  Laterality: Right;   OTHER SURGICAL HISTORY     right knee replacement 8/11      FAMILY HISTORY: Family History  Problem Relation Age of Onset   Stroke Mother    Parkinsonism Mother    Diabetes Mother    Colon cancer Neg Hx    Esophageal cancer Neg Hx    Rectal cancer Neg Hx    Stomach cancer Neg Hx      SOCIAL  HISTORY: Social History   Socioeconomic History   Marital status: Married    Spouse name: clifford   Number of children: 1   Years of education: MA   Highest education level: Not on file  Occupational History   Occupation: retired  Tobacco Use   Smoking status: Never   Smokeless tobacco: Former    Types: Chew    Quit date: 05/08/2014  Vaping Use   Vaping Use: Never used  Substance and Sexual Activity   Alcohol use: No    Alcohol/week: 0.0 standard drinks of alcohol   Drug use: No   Sexual activity: Not Currently  Other Topics Concern   Not on file  Social History Narrative   Patient lives at home with her spouse.   Caffeine Use: 1 cup of coffee   Patient is right handed.    Social Determinants of Health   Financial Resource Strain: Not on file  Food Insecurity: Not on file  Transportation Needs: Not on file  Physical Activity: Not on file  Stress: Not on file  Social Connections: Not on file  Intimate Partner Violence: Not on file      PHYSICAL EXAM  Vitals:   10/02/22 1400 10/02/22 1421  BP: (!) 190/89 (!) 140/72  Pulse: 95   Weight: 159 lb 3.2 oz (72.2 kg)   Height: 5\' 4"  (1.626 m)      Body mass index is 27.33 kg/m.   Generalized: Well developed, in no acute distress  Cardiology: normal rate and rhythm, no murmur auscultated  Respiratory: clear to auscultation bilaterally    Neurological examination  Mentation: Alert oriented to time, place, history taking. Follows all commands speech and language fluent Cranial nerve II-XII: Pupils were equal round reactive to light. Extraocular movements were full, visual field were full on confrontational test. Facial sensation and strength were normal. Head turning and shoulder shrug  were normal and symmetric. Motor: The motor testing reveals 5 over 5 strength of all 4 extremities.  Good symmetric motor tone is noted throughout.  Gait and station: Gait is arthritic, stooped posture. Uses single prong cane for  stability.   DIAGNOSTIC DATA (LABS, IMAGING, TESTING) - I reviewed patient records, labs, notes, testing and imaging myself where available.  Lab Results  Component Value Date   WBC 6.9 07/17/2022   HGB 10.9 (L) 07/17/2022   HCT 36.3 07/17/2022   MCV 74.2 (L) 07/17/2022   PLT 241 07/17/2022      Component Value Date/Time   NA 141 07/11/2016 1426   K 3.7 07/11/2016 1426   CL 101 07/11/2016 1426   CO2 32 07/11/2016 1426   GLUCOSE 116 (H) 07/11/2016 1426   BUN 15 07/11/2016 1426   CREATININE 0.98 07/17/2016 1923   CALCIUM 8.8 (L) 07/11/2016 1426   PROT 7.8 04/26/2011 1220   ALBUMIN 4.0 04/26/2011 1220   AST 23 04/26/2011 1220   ALT 15 04/26/2011 1220   ALKPHOS 75 04/26/2011 1220   BILITOT 0.2 (L) 04/26/2011 1220   GFRNONAA 55 (L) 07/17/2016 1923   GFRAA >60 07/17/2016 1923   No results found for: "CHOL", "HDL", "LDLCALC", "LDLDIRECT", "TRIG", "CHOLHDL" No results found for: "HGBA1C" No results found for: "VITAMINB12" No results found for: "TSH"      No data to display               No data to display           ASSESSMENT AND PLAN  82 y.o. year old female  has a past medical history of Anemia, Arthritis, Blood transfusion, Blood transfusion without reported diagnosis, Essential tremor, Glaucoma, H/O seasonal allergies, adenomatous polyp of colon (08/18/2014), Hypertension, and Tremor of both hands. here with   Essential tremor - Plan: primidone (MYSOLINE) 50 MG tablet, Primidone level  Jenna Howe is doing well, today. She will continue primidone 25mg  twice daily. She will continue close follow up with Dr Lysle Rubens for comorbidity management and labs. Healthy lifestyle habits encouraged. She will follow up in 1 year or may follow up with PCP for refills and see [place] as needed. She is aware of and understands plan.   Orders Placed This Encounter  Procedures   Primidone level     Meds ordered this encounter  Medications   primidone (MYSOLINE) 50 MG tablet     Sig: Take 0.5 tablets (25 mg total) by mouth 2 (two) times daily.    Dispense:  90 tablet    Refill:  4    Patient has a hard time cutting pill in half but 25mg  dose not available. Could pharmacy staff cut tablet for her?    Order Specific Question:   Supervising Provider    Answer:   Melvenia Beam [id number]    Debbora Presto, MSN, FNP-C 10/02/2022, 2:29 PM  [facility] [address], [place], [place] [address] [phone]

## 2022-10-03 ENCOUNTER — Ambulatory Visit: Payer: Medicare HMO | Admitting: Family Medicine

## 2022-10-03 LAB — PRIMIDONE, SERUM
Phenobarbital, Serum: 3 ug/mL — ABNORMAL LOW (ref 15–40)
Primidone Lvl: 3.7 ug/mL — ABNORMAL LOW (ref 5.0–12.0)

## 2022-10-22 DIAGNOSIS — M791 Myalgia, unspecified site: Secondary | ICD-10-CM | POA: Diagnosis not present

## 2022-10-22 DIAGNOSIS — M47816 Spondylosis without myelopathy or radiculopathy, lumbar region: Secondary | ICD-10-CM | POA: Diagnosis not present

## 2022-10-22 DIAGNOSIS — Z79891 Long term (current) use of opiate analgesic: Secondary | ICD-10-CM | POA: Diagnosis not present

## 2022-10-22 DIAGNOSIS — M4726 Other spondylosis with radiculopathy, lumbar region: Secondary | ICD-10-CM | POA: Diagnosis not present

## 2022-10-22 DIAGNOSIS — M545 Low back pain, unspecified: Secondary | ICD-10-CM | POA: Diagnosis not present

## 2022-10-31 DIAGNOSIS — M5451 Vertebrogenic low back pain: Secondary | ICD-10-CM | POA: Diagnosis not present

## 2022-10-31 DIAGNOSIS — Z7409 Other reduced mobility: Secondary | ICD-10-CM | POA: Diagnosis not present

## 2022-10-31 DIAGNOSIS — E119 Type 2 diabetes mellitus without complications: Secondary | ICD-10-CM | POA: Diagnosis not present

## 2022-11-01 DIAGNOSIS — M47816 Spondylosis without myelopathy or radiculopathy, lumbar region: Secondary | ICD-10-CM | POA: Diagnosis not present

## 2022-11-05 DIAGNOSIS — Z7409 Other reduced mobility: Secondary | ICD-10-CM | POA: Diagnosis not present

## 2022-11-05 DIAGNOSIS — M5451 Vertebrogenic low back pain: Secondary | ICD-10-CM | POA: Diagnosis not present

## 2022-11-07 DIAGNOSIS — M5451 Vertebrogenic low back pain: Secondary | ICD-10-CM | POA: Diagnosis not present

## 2022-11-07 DIAGNOSIS — Z7409 Other reduced mobility: Secondary | ICD-10-CM | POA: Diagnosis not present

## 2022-11-12 DIAGNOSIS — M5451 Vertebrogenic low back pain: Secondary | ICD-10-CM | POA: Diagnosis not present

## 2022-11-12 DIAGNOSIS — Z7409 Other reduced mobility: Secondary | ICD-10-CM | POA: Diagnosis not present

## 2022-11-14 DIAGNOSIS — Z7409 Other reduced mobility: Secondary | ICD-10-CM | POA: Diagnosis not present

## 2022-11-14 DIAGNOSIS — M5451 Vertebrogenic low back pain: Secondary | ICD-10-CM | POA: Diagnosis not present

## 2022-11-20 ENCOUNTER — Ambulatory Visit
Admission: RE | Admit: 2022-11-20 | Discharge: 2022-11-20 | Disposition: A | Discharge status: Home / Self Care | Payer: Medicare HMO | Source: Ambulatory Visit | Attending: Internal Medicine | Admitting: Internal Medicine

## 2022-11-20 ENCOUNTER — Other Ambulatory Visit: Payer: Self-pay | Admitting: Internal Medicine

## 2022-11-20 DIAGNOSIS — R0781 Pleurodynia: Secondary | ICD-10-CM | POA: Diagnosis not present

## 2022-11-20 DIAGNOSIS — Z9181 History of falling: Secondary | ICD-10-CM | POA: Diagnosis not present

## 2022-11-21 DIAGNOSIS — M47816 Spondylosis without myelopathy or radiculopathy, lumbar region: Secondary | ICD-10-CM | POA: Diagnosis not present

## 2022-12-01 DIAGNOSIS — M47816 Spondylosis without myelopathy or radiculopathy, lumbar region: Secondary | ICD-10-CM | POA: Diagnosis not present

## 2022-12-22 DIAGNOSIS — M47816 Spondylosis without myelopathy or radiculopathy, lumbar region: Secondary | ICD-10-CM | POA: Diagnosis not present

## 2022-12-25 ENCOUNTER — Other Ambulatory Visit: Payer: Self-pay | Admitting: Internal Medicine

## 2022-12-25 ENCOUNTER — Ambulatory Visit
Admission: RE | Admit: 2022-12-25 | Discharge: 2022-12-25 | Disposition: A | Discharge status: Home / Self Care | Payer: Medicare HMO | Source: Ambulatory Visit | Attending: Internal Medicine | Admitting: Internal Medicine

## 2022-12-25 DIAGNOSIS — M71571 Other bursitis, not elsewhere classified, right ankle and foot: Secondary | ICD-10-CM | POA: Diagnosis not present

## 2022-12-25 DIAGNOSIS — Z9181 History of falling: Secondary | ICD-10-CM | POA: Diagnosis not present

## 2022-12-25 DIAGNOSIS — M25511 Pain in right shoulder: Secondary | ICD-10-CM

## 2022-12-25 DIAGNOSIS — M7662 Achilles tendinitis, left leg: Secondary | ICD-10-CM | POA: Diagnosis not present

## 2022-12-25 DIAGNOSIS — M7661 Achilles tendinitis, right leg: Secondary | ICD-10-CM | POA: Diagnosis not present

## 2022-12-25 DIAGNOSIS — M71572 Other bursitis, not elsewhere classified, left ankle and foot: Secondary | ICD-10-CM | POA: Diagnosis not present

## 2022-12-25 DIAGNOSIS — I10 Essential (primary) hypertension: Secondary | ICD-10-CM | POA: Diagnosis not present

## 2022-12-31 DIAGNOSIS — E1140 Type 2 diabetes mellitus with diabetic neuropathy, unspecified: Secondary | ICD-10-CM | POA: Diagnosis not present

## 2023-01-01 DIAGNOSIS — M47816 Spondylosis without myelopathy or radiculopathy, lumbar region: Secondary | ICD-10-CM | POA: Diagnosis not present

## 2023-01-02 DIAGNOSIS — M47816 Spondylosis without myelopathy or radiculopathy, lumbar region: Secondary | ICD-10-CM | POA: Diagnosis not present

## 2023-01-02 DIAGNOSIS — M545 Low back pain, unspecified: Secondary | ICD-10-CM | POA: Diagnosis not present

## 2023-01-02 DIAGNOSIS — M791 Myalgia, unspecified site: Secondary | ICD-10-CM | POA: Diagnosis not present

## 2023-01-02 DIAGNOSIS — M4726 Other spondylosis with radiculopathy, lumbar region: Secondary | ICD-10-CM | POA: Diagnosis not present

## 2023-01-02 DIAGNOSIS — Z79891 Long term (current) use of opiate analgesic: Secondary | ICD-10-CM | POA: Diagnosis not present

## 2023-01-02 DIAGNOSIS — M722 Plantar fascial fibromatosis: Secondary | ICD-10-CM | POA: Diagnosis not present

## 2023-01-06 ENCOUNTER — Telehealth: Payer: Self-pay | Admitting: Oncology

## 2023-01-06 NOTE — Telephone Encounter (Signed)
Spoke with patient confirming upcoming appointment  

## 2023-01-08 DIAGNOSIS — M7662 Achilles tendinitis, left leg: Secondary | ICD-10-CM | POA: Diagnosis not present

## 2023-01-08 DIAGNOSIS — M7661 Achilles tendinitis, right leg: Secondary | ICD-10-CM | POA: Diagnosis not present

## 2023-01-16 ENCOUNTER — Inpatient Hospital Stay: Payer: Medicare HMO | Admitting: Oncology

## 2023-01-16 ENCOUNTER — Inpatient Hospital Stay: Payer: Medicare HMO

## 2023-01-21 DIAGNOSIS — M47816 Spondylosis without myelopathy or radiculopathy, lumbar region: Secondary | ICD-10-CM | POA: Diagnosis not present

## 2023-01-29 DIAGNOSIS — M25511 Pain in right shoulder: Secondary | ICD-10-CM | POA: Diagnosis not present

## 2023-01-29 DIAGNOSIS — M47816 Spondylosis without myelopathy or radiculopathy, lumbar region: Secondary | ICD-10-CM | POA: Diagnosis not present

## 2023-01-29 DIAGNOSIS — M4726 Other spondylosis with radiculopathy, lumbar region: Secondary | ICD-10-CM | POA: Diagnosis not present

## 2023-01-29 DIAGNOSIS — Z79891 Long term (current) use of opiate analgesic: Secondary | ICD-10-CM | POA: Diagnosis not present

## 2023-01-29 DIAGNOSIS — M545 Low back pain, unspecified: Secondary | ICD-10-CM | POA: Diagnosis not present

## 2023-01-31 DIAGNOSIS — M47816 Spondylosis without myelopathy or radiculopathy, lumbar region: Secondary | ICD-10-CM | POA: Diagnosis not present

## 2023-02-03 ENCOUNTER — Ambulatory Visit
Admission: RE | Admit: 2023-02-03 | Discharge: 2023-02-03 | Disposition: A | Discharge status: Home / Self Care | Payer: Medicare HMO | Source: Ambulatory Visit | Attending: Internal Medicine | Admitting: Internal Medicine

## 2023-02-03 DIAGNOSIS — Z1231 Encounter for screening mammogram for malignant neoplasm of breast: Secondary | ICD-10-CM | POA: Diagnosis not present

## 2023-02-20 ENCOUNTER — Inpatient Hospital Stay: Payer: Medicare HMO

## 2023-02-20 ENCOUNTER — Inpatient Hospital Stay (HOSPITAL_BASED_OUTPATIENT_CLINIC_OR_DEPARTMENT_OTHER): Payer: Medicare HMO | Admitting: Oncology

## 2023-02-20 ENCOUNTER — Inpatient Hospital Stay: Payer: Medicare HMO | Attending: Oncology

## 2023-02-20 ENCOUNTER — Inpatient Hospital Stay: Payer: Medicare HMO | Admitting: Oncology

## 2023-02-20 VITALS — BP 160/72 | HR 92 | Temp 97.9°F | Resp 18 | Ht 64.0 in | Wt 155.8 lb

## 2023-02-20 DIAGNOSIS — R5383 Other fatigue: Secondary | ICD-10-CM | POA: Diagnosis not present

## 2023-02-20 DIAGNOSIS — H409 Unspecified glaucoma: Secondary | ICD-10-CM | POA: Diagnosis not present

## 2023-02-20 DIAGNOSIS — D509 Iron deficiency anemia, unspecified: Secondary | ICD-10-CM | POA: Diagnosis not present

## 2023-02-20 DIAGNOSIS — D563 Thalassemia minor: Secondary | ICD-10-CM | POA: Diagnosis not present

## 2023-02-20 DIAGNOSIS — R251 Tremor, unspecified: Secondary | ICD-10-CM | POA: Insufficient documentation

## 2023-02-20 DIAGNOSIS — M1990 Unspecified osteoarthritis, unspecified site: Secondary | ICD-10-CM | POA: Diagnosis not present

## 2023-02-20 DIAGNOSIS — I10 Essential (primary) hypertension: Secondary | ICD-10-CM | POA: Insufficient documentation

## 2023-02-20 LAB — CBC WITH DIFFERENTIAL (CANCER CENTER ONLY)
Abs Immature Granulocytes: 0.01 10*3/uL (ref 0.00–0.07)
Basophils Absolute: 0 10*3/uL (ref 0.0–0.1)
Basophils Relative: 0 %
Eosinophils Absolute: 0.3 10*3/uL (ref 0.0–0.5)
Eosinophils Relative: 4 %
HCT: 35.5 % — ABNORMAL LOW (ref 36.0–46.0)
Hemoglobin: 11.1 g/dL — ABNORMAL LOW (ref 12.0–15.0)
Immature Granulocytes: 0 %
Lymphocytes Relative: 30 %
Lymphs Abs: 2.3 10*3/uL (ref 0.7–4.0)
MCH: 22.4 pg — ABNORMAL LOW (ref 26.0–34.0)
MCHC: 31.3 g/dL (ref 30.0–36.0)
MCV: 71.7 fL — ABNORMAL LOW (ref 80.0–100.0)
Monocytes Absolute: 0.6 10*3/uL (ref 0.1–1.0)
Monocytes Relative: 8 %
Neutro Abs: 4.4 10*3/uL (ref 1.7–7.7)
Neutrophils Relative %: 58 %
Platelet Count: 263 10*3/uL (ref 150–400)
RBC: 4.95 MIL/uL (ref 3.87–5.11)
RDW: 17.2 % — ABNORMAL HIGH (ref 11.5–15.5)
WBC Count: 7.6 10*3/uL (ref 4.0–10.5)
nRBC: 0 % (ref 0.0–0.2)

## 2023-02-20 NOTE — Progress Notes (Signed)
  [facility] OFFICE PROGRESS NOTE   Diagnosis: Microcytic anemia  INTERVAL HISTORY:   Jenna Howe returns as scheduled.  She reports malaise for several years.  Good appetite.  No dyspnea.  No difficulty with bowel function.  No bleeding.  Objective:  Vital signs in last 24 hours:  Blood pressure (!) 163/78, pulse 92, temperature 97.9 F (36.6 C), temperature source Oral, resp. rate 18, height 5\' 4"  (1.626 m), weight 155 lb 12.8 oz (70.7 kg), SpO2 100%.    Lymphatics: No cervical, supraclavicular, axillary, or inguinal nodes Resp: Lungs clear bilaterally Cardio: Regular rate and rhythm GI: No hepatosplenomegaly Vascular: Trace edema to lower leg bilaterally with support stockings in place  Lab Results:  Lab Results  Component Value Date   WBC 7.6 02/20/2023   HGB 11.1 (L) 02/20/2023   HCT 35.5 (L) 02/20/2023   MCV 71.7 (L) 02/20/2023   PLT 263 02/20/2023   NEUTROABS 4.4 02/20/2023    CMP  Lab Results  Component Value Date   NA 141 07/11/2016   K 3.7 07/11/2016   CL 101 07/11/2016   CO2 32 07/11/2016   GLUCOSE 116 (H) 07/11/2016   BUN 15 07/11/2016   CREATININE 0.98 07/17/2016   CALCIUM 8.8 (L) 07/11/2016   PROT 7.8 04/26/2011   ALBUMIN 4.0 04/26/2011   AST 23 04/26/2011   ALT 15 04/26/2011   ALKPHOS 75 04/26/2011   BILITOT 0.2 (L) 04/26/2011   GFRNONAA 55 (L) 07/17/2016   GFRAA >60 07/17/2016   Medications: I have reviewed the patient's current medications.   Assessment/Plan: Microcytic anemia Hemoglobin electrophoresis 04/30/2021 consistent with beta thalassemia minor Alpha thalassemia DNA analysis 04/30/2021 consistent with alpha thalassemia trait Beta globin gene testing 01/16/2022-HBB pathogenic variant Mild neutropenia 04/30/2021, normal on 05/14/2021 and 09/17/2021 Hypertension Osteoarthritis Creatinine 1.4 on 03/28/2021 Tremor Glaucoma Mild elevation of kappa and lambda free light chains 04/30/2021    Disposition: Jenna Howe  has chronic microcytic anemia.  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  I discussed the inherited nature of the hemoglobin chain abnormalities with Jenna Howe and her husband.  No treatment is indicated.  I doubt her malaise is related to the chronic mild microcytic anemia.  She will not be referred to the genetics counselor since she has no children or living siblings.  She plans to continue clinical follow-up with Dr.Husain.  I am available to see her if she develops progressive anemia or a new hematologic abnormality.  Thornton Papas, MD  02/20/2023  9:25 AM

## 2023-02-21 DIAGNOSIS — M47816 Spondylosis without myelopathy or radiculopathy, lumbar region: Secondary | ICD-10-CM | POA: Diagnosis not present

## 2023-03-03 DIAGNOSIS — M47816 Spondylosis without myelopathy or radiculopathy, lumbar region: Secondary | ICD-10-CM | POA: Diagnosis not present

## 2023-03-03 DIAGNOSIS — M25511 Pain in right shoulder: Secondary | ICD-10-CM | POA: Diagnosis not present

## 2023-03-12 DIAGNOSIS — M47816 Spondylosis without myelopathy or radiculopathy, lumbar region: Secondary | ICD-10-CM | POA: Diagnosis not present

## 2023-03-12 DIAGNOSIS — M25511 Pain in right shoulder: Secondary | ICD-10-CM | POA: Diagnosis not present

## 2023-03-12 DIAGNOSIS — M545 Low back pain, unspecified: Secondary | ICD-10-CM | POA: Diagnosis not present

## 2023-03-12 DIAGNOSIS — M4726 Other spondylosis with radiculopathy, lumbar region: Secondary | ICD-10-CM | POA: Diagnosis not present

## 2023-03-12 DIAGNOSIS — Z79891 Long term (current) use of opiate analgesic: Secondary | ICD-10-CM | POA: Diagnosis not present

## 2023-03-19 DIAGNOSIS — H2513 Age-related nuclear cataract, bilateral: Secondary | ICD-10-CM | POA: Diagnosis not present

## 2023-03-19 DIAGNOSIS — H25043 Posterior subcapsular polar age-related cataract, bilateral: Secondary | ICD-10-CM | POA: Diagnosis not present

## 2023-03-19 DIAGNOSIS — H401132 Primary open-angle glaucoma, bilateral, moderate stage: Secondary | ICD-10-CM | POA: Diagnosis not present

## 2023-03-20 ENCOUNTER — Telehealth: Payer: Self-pay | Admitting: Family Medicine

## 2023-03-20 DIAGNOSIS — G250 Essential tremor: Secondary | ICD-10-CM

## 2023-03-20 MED ORDER — PRIMIDONE 50 MG PO TABS: 25.0000 mg | ORAL_TABLET | Freq: Two times a day (BID) | ORAL | 2 refills | Status: DC

## 2023-03-20 NOTE — Telephone Encounter (Signed)
Pt is requesting a refill for primidone (MYSOLINE) 50 MG tablet .  Pharmacy: [id number]

## 2023-03-20 NOTE — Telephone Encounter (Signed)
Called pt to clarify about what pharmacy, [facility] said [facility] was no longer.  Appreicated that information.  Renewed to [facility] [address] In [place].  Pt appreciated call.

## 2023-03-24 DIAGNOSIS — M47816 Spondylosis without myelopathy or radiculopathy, lumbar region: Secondary | ICD-10-CM | POA: Diagnosis not present

## 2023-03-27 DIAGNOSIS — R7303 Prediabetes: Secondary | ICD-10-CM | POA: Diagnosis not present

## 2023-03-27 DIAGNOSIS — E782 Mixed hyperlipidemia: Secondary | ICD-10-CM | POA: Diagnosis not present

## 2023-03-27 DIAGNOSIS — I739 Peripheral vascular disease, unspecified: Secondary | ICD-10-CM | POA: Diagnosis not present

## 2023-03-27 DIAGNOSIS — I10 Essential (primary) hypertension: Secondary | ICD-10-CM | POA: Diagnosis not present

## 2023-03-27 DIAGNOSIS — Z23 Encounter for immunization: Secondary | ICD-10-CM | POA: Diagnosis not present

## 2023-03-27 DIAGNOSIS — Z1331 Encounter for screening for depression: Secondary | ICD-10-CM | POA: Diagnosis not present

## 2023-03-27 DIAGNOSIS — D563 Thalassemia minor: Secondary | ICD-10-CM | POA: Diagnosis not present

## 2023-03-27 DIAGNOSIS — G250 Essential tremor: Secondary | ICD-10-CM | POA: Diagnosis not present

## 2023-03-27 DIAGNOSIS — Z9181 History of falling: Secondary | ICD-10-CM | POA: Diagnosis not present

## 2023-03-27 DIAGNOSIS — N1831 Chronic kidney disease, stage 3a: Secondary | ICD-10-CM | POA: Diagnosis not present

## 2023-03-27 DIAGNOSIS — M5136 Other intervertebral disc degeneration, lumbar region: Secondary | ICD-10-CM | POA: Diagnosis not present

## 2023-03-27 DIAGNOSIS — Z0000 Encounter for general adult medical examination without abnormal findings: Secondary | ICD-10-CM | POA: Diagnosis not present

## 2023-04-03 DIAGNOSIS — M47816 Spondylosis without myelopathy or radiculopathy, lumbar region: Secondary | ICD-10-CM | POA: Diagnosis not present

## 2023-04-17 DIAGNOSIS — L97412 Non-pressure chronic ulcer of right heel and midfoot with fat layer exposed: Secondary | ICD-10-CM | POA: Diagnosis not present

## 2023-04-17 DIAGNOSIS — M24571 Contracture, right ankle: Secondary | ICD-10-CM | POA: Diagnosis not present

## 2023-04-23 DIAGNOSIS — M545 Low back pain, unspecified: Secondary | ICD-10-CM | POA: Diagnosis not present

## 2023-04-23 DIAGNOSIS — M4726 Other spondylosis with radiculopathy, lumbar region: Secondary | ICD-10-CM | POA: Diagnosis not present

## 2023-04-23 DIAGNOSIS — M25511 Pain in right shoulder: Secondary | ICD-10-CM | POA: Diagnosis not present

## 2023-04-23 DIAGNOSIS — Z79891 Long term (current) use of opiate analgesic: Secondary | ICD-10-CM | POA: Diagnosis not present

## 2023-04-23 DIAGNOSIS — M47816 Spondylosis without myelopathy or radiculopathy, lumbar region: Secondary | ICD-10-CM | POA: Diagnosis not present

## 2023-04-24 DIAGNOSIS — L97421 Non-pressure chronic ulcer of left heel and midfoot limited to breakdown of skin: Secondary | ICD-10-CM | POA: Diagnosis not present

## 2023-05-03 DIAGNOSIS — M47816 Spondylosis without myelopathy or radiculopathy, lumbar region: Secondary | ICD-10-CM | POA: Diagnosis not present

## 2023-05-08 DIAGNOSIS — L97422 Non-pressure chronic ulcer of left heel and midfoot with fat layer exposed: Secondary | ICD-10-CM | POA: Diagnosis not present

## 2023-05-16 ENCOUNTER — Other Ambulatory Visit: Payer: Self-pay

## 2023-05-16 DIAGNOSIS — I70213 Atherosclerosis of native arteries of extremities with intermittent claudication, bilateral legs: Secondary | ICD-10-CM

## 2023-05-21 DIAGNOSIS — M25511 Pain in right shoulder: Secondary | ICD-10-CM | POA: Diagnosis not present

## 2023-05-21 DIAGNOSIS — M47816 Spondylosis without myelopathy or radiculopathy, lumbar region: Secondary | ICD-10-CM | POA: Diagnosis not present

## 2023-05-21 DIAGNOSIS — M4726 Other spondylosis with radiculopathy, lumbar region: Secondary | ICD-10-CM | POA: Diagnosis not present

## 2023-05-21 DIAGNOSIS — Z79891 Long term (current) use of opiate analgesic: Secondary | ICD-10-CM | POA: Diagnosis not present

## 2023-05-21 DIAGNOSIS — M545 Low back pain, unspecified: Secondary | ICD-10-CM | POA: Diagnosis not present

## 2023-05-22 DIAGNOSIS — M722 Plantar fascial fibromatosis: Secondary | ICD-10-CM | POA: Diagnosis not present

## 2023-05-24 DIAGNOSIS — M47816 Spondylosis without myelopathy or radiculopathy, lumbar region: Secondary | ICD-10-CM | POA: Diagnosis not present

## 2023-05-29 NOTE — Progress Notes (Signed)
Patient ID: Jenna Howe, female   DOB: 08-Mar-1941, 82 y.o.   MRN: [id number]  Reason for Consult: New Patient (Initial Visit)   Referred by Georgann Housekeeper, MD  Subjective:     HPI  Jenna Howe is a 82 y.o. female with a history of HTN and HLD presenting for chronic callus of the bilateral heel.  She reports she has had some calluses on her bilateral Achilles and heels for some time.  Reports that can be painful at times.  She denies claudication, rest pain or open ulceration.  She is on a statin, she is not taking aspirin at this time.   Past Medical History:  Diagnosis Date   Anemia    Arthritis    arthritis in knees and hands . Right wrist and thumb splinted due to pain.   Blood transfusion    10/12 after knee surgery    Blood transfusion without reported diagnosis    Essential tremor    Glaucoma    H/O seasonal allergies    sinus congestion   Hx of adenomatous polyp of colon 08/18/2014   Hypertension    Tremor of both hands    mild , tx. Primidone   Family History  Problem Relation Age of Onset   Stroke Mother    Parkinsonism Mother    Diabetes Mother    Colon cancer Neg Hx    Esophageal cancer Neg Hx    Rectal cancer Neg Hx    Stomach cancer Neg Hx    Past Surgical History:  Procedure Laterality Date   ABDOMINAL HYSTERECTOMY     Vaginal '88   CERVICAL LAMINECTOMY  1995   cervical fusion"some limited ROM"   CHOLECYSTECTOMY     1999   COLONOSCOPY     JOINT REPLACEMENT     right knee 10/12 , left knee 2006    KNEE ARTHROTOMY Right 07/17/2016   Procedure: KNEE ARTHROTOMY with scar excision;  Surgeon: Ollen Gross, MD;  Location: [facility];  Service: Orthopedics;  Laterality: Right;  spinal+block   KNEE CLOSED REDUCTION  07/10/2011   Procedure: CLOSED MANIPULATION KNEE;  Surgeon: Loanne Drilling;  Location: [facility];  Service: Orthopedics;  Laterality: Right;   OTHER SURGICAL HISTORY     right knee replacement 8/11     Short Social History:  Social History    Tobacco Use   Smoking status: Never   Smokeless tobacco: Former    Types: Chew    Quit date: 05/08/2014  Substance Use Topics   Alcohol use: No    Alcohol/week: 0.0 standard drinks of alcohol    Allergies  Allergen Reactions   Clinoril [Sulindac]     Stomach upset   Gabapentin Other (See Comments)    Muscle jerking    Sulfamethoxazole-Trimethoprim     Other reaction(s): rash   Tizanidine     Other reaction(s): itch   Celebrex [Celecoxib] Rash    Stomach upset   Penicillins Rash    Has patient had a PCN reaction causing immediate rash, facial/tongue/throat swelling, SOB or lightheadedness with hypotension:Yes Has patient had a PCN reaction causing severe rash involving mucus membranes or skin necrosis: No Has patient had a PCN reaction that required hospitalization No Has patient had a PCN reaction occurring within the last 10 years: No If all of the above answers are "NO", then may proceed with Cephalosporin use.    Sulfa Antibiotics Rash   Sulfur Rash    Current Outpatient Medications  Medication  Sig Dispense Refill   amLODipine (NORVASC) 5 MG tablet Take 5 mg by mouth daily.     BIOTIN 5000 PO Take 5,000 mcg by mouth daily.      bumetanide (BUMEX) 1 MG tablet Take 1 mg by mouth daily.     Calcium Citrate-Vitamin D (CITRACAL + D PO) Take 1 tablet by mouth 2 (two) times daily.     Cholecalciferol (VITAMIN D3) 1000 UNITS CAPS Take 1,000 Units by mouth daily.      clindamycin (CLEOCIN) 150 MG capsule Take 4 capsules orally prior to dental procedure.     cyclobenzaprine (FLEXERIL) 10 MG tablet Take 10 mg by mouth 3 (three) times daily.     doxazosin (CARDURA) 4 MG tablet Take 4 mg by mouth daily.     fish oil-omega-3 fatty acids 1000 MG capsule Take 1 g by mouth 2 (two) times daily.      HYDROcodone-acetaminophen (NORCO/VICODIN) 5-325 MG tablet 1 tablet Orally Up to 4 times daily as needed for severe pain for 30 days     latanoprost (XALATAN) 0.005 % ophthalmic solution  Place 1 drop into both eyes at bedtime.     loratadine (CLARITIN) 10 MG tablet Take 10 mg by mouth daily.     Multiple Vitamin (MULTIVITAMIN) tablet Take 1 tablet by mouth daily.     primidone (MYSOLINE) 50 MG tablet Take 0.5 tablets (25 mg total) by mouth 2 (two) times daily. 90 tablet 2   simvastatin (ZOCOR) 20 MG tablet Take 20 mg by mouth daily at 6 PM.     timolol (TIMOPTIC) 0.5 % ophthalmic solution Place 1 drop into both eyes daily.     traMADol (ULTRAM) 50 MG tablet Take 1 tablet (50 mg total) by mouth every 6 (six) hours as needed for moderate pain. 56 tablet 0   valsartan (DIOVAN) 320 MG tablet Take 320 mg by mouth daily.     vitamin C (ASCORBIC ACID) 500 MG tablet Take 500 mg by mouth daily.     No current facility-administered medications for this visit.    REVIEW OF SYSTEMS  Negative other than noted in HPI     Objective:  Objective   Vitals:   05/30/23 1110  BP: (!) 177/85  Pulse: 88  Resp: 20  Temp: 97.7 F (36.5 C)  SpO2: 96%  Weight: 156 lb (70.8 kg)  Height: 5\' 4"  (1.626 m)   Body mass index is 26.78 kg/m.  Physical Exam General: no acute distress Cardiac: hemodynamically stable, nontachycardic Pulm: normal work of breathing Neuro: alert, no focal deficit Extremities: Bilateral heels/posterior ankle with what appears to be traumatic callus, no open wound, drainage or erythema.  Moderate edema from dorsum of foot to mid shin bilaterally, no cyanosis or wounds Vascular:   Right: Palpable femoral  Left: Palpable femoral   Data: ABI +---------+------------------+-----+--------+--------+  Right   Rt Pressure (mmHg)IndexWaveformComment   +---------+------------------+-----+--------+--------+  Brachial 168                                      +---------+------------------+-----+--------+--------+  PTA     176               1.05 biphasic          +---------+------------------+-----+--------+--------+  DP      174               1.04  biphasic          +---------+------------------+-----+--------+--------+  Great [id number]               0.79 Normal            +---------+------------------+-----+--------+--------+   +---------+------------------+-----+----------+-------+  Left    Lt Pressure (mmHg)IndexWaveform  Comment  +---------+------------------+-----+----------+-------+  Brachial 166                                       +---------+------------------+-----+----------+-------+  PTA     123               0.73 monophasic         +---------+------------------+-----+----------+-------+  DP      121               0.72 monophasic         +---------+------------------+-----+----------+-------+  Great [id number]                0.40 Abnormal           +---------+------------------+-----+----------+-------+      Assessment/Plan:     Jenna Howe is a 82 y.o. female with HTN and HLD presenting for heel callus evaluation.  I explained that she does have peripheral arterial disease affecting her left lower extremity but at this time she is asymptomatic.  We discussed the natural history and stages of PAD.  We discussed the symptoms of CL TI.  Plan for follow-up in 12 months with ABI.  Instructed to call for earlier appointment should she start to develop rest pain or wounds. Instructed to start baby aspirin daily.   Recommendations to optimize cardiovascular risk: Abstinence from all tobacco products. Blood glucose control with goal A1c < 7%. Blood pressure control with goal blood pressure < 140/90 mmHg. Lipid reduction therapy with goal LDL-C <100 mg/dL  Aspirin 81mg  PO QD.  Atorvastatin 40-80mg  PO QD (or other "high intensity" statin therapy).     Daria Pastures MD [facility]

## 2023-05-29 NOTE — Telephone Encounter (Signed)
Telephone call  

## 2023-05-30 ENCOUNTER — Ambulatory Visit (HOSPITAL_COMMUNITY)
Admission: RE | Admit: 2023-05-30 | Discharge: 2023-05-30 | Disposition: A | Discharge status: Home / Self Care | Payer: Medicare HMO | Source: Ambulatory Visit | Attending: Vascular Surgery

## 2023-05-30 ENCOUNTER — Ambulatory Visit (INDEPENDENT_AMBULATORY_CARE_PROVIDER_SITE_OTHER): Payer: Medicare HMO | Admitting: Vascular Surgery

## 2023-05-30 ENCOUNTER — Encounter: Payer: Self-pay | Admitting: Vascular Surgery

## 2023-05-30 VITALS — BP 177/85 | HR 88 | Temp 97.7°F | Resp 20 | Ht 64.0 in | Wt 156.0 lb

## 2023-05-30 DIAGNOSIS — E785 Hyperlipidemia, unspecified: Secondary | ICD-10-CM | POA: Diagnosis not present

## 2023-05-30 DIAGNOSIS — I10 Essential (primary) hypertension: Secondary | ICD-10-CM

## 2023-05-30 DIAGNOSIS — I70213 Atherosclerosis of native arteries of extremities with intermittent claudication, bilateral legs: Secondary | ICD-10-CM | POA: Insufficient documentation

## 2023-05-30 DIAGNOSIS — I739 Peripheral vascular disease, unspecified: Secondary | ICD-10-CM | POA: Diagnosis not present

## 2023-05-30 LAB — VAS US ABI WITH/WO TBI
Left ABI: 0.73
Right ABI: 1.05

## 2023-06-03 DIAGNOSIS — M47816 Spondylosis without myelopathy or radiculopathy, lumbar region: Secondary | ICD-10-CM | POA: Diagnosis not present

## 2023-06-11 DIAGNOSIS — M85851 Other specified disorders of bone density and structure, right thigh: Secondary | ICD-10-CM | POA: Diagnosis not present

## 2023-06-11 DIAGNOSIS — I10 Essential (primary) hypertension: Secondary | ICD-10-CM | POA: Diagnosis not present

## 2023-06-11 DIAGNOSIS — Z01419 Encounter for gynecological examination (general) (routine) without abnormal findings: Secondary | ICD-10-CM | POA: Diagnosis not present

## 2023-06-11 DIAGNOSIS — N8110 Cystocele, unspecified: Secondary | ICD-10-CM | POA: Diagnosis not present

## 2023-06-23 DIAGNOSIS — M47816 Spondylosis without myelopathy or radiculopathy, lumbar region: Secondary | ICD-10-CM | POA: Diagnosis not present

## 2023-06-27 DIAGNOSIS — M4726 Other spondylosis with radiculopathy, lumbar region: Secondary | ICD-10-CM | POA: Diagnosis not present

## 2023-06-27 DIAGNOSIS — M545 Low back pain, unspecified: Secondary | ICD-10-CM | POA: Diagnosis not present

## 2023-06-27 DIAGNOSIS — Z79891 Long term (current) use of opiate analgesic: Secondary | ICD-10-CM | POA: Diagnosis not present

## 2023-06-27 DIAGNOSIS — R7303 Prediabetes: Secondary | ICD-10-CM | POA: Diagnosis not present

## 2023-06-27 DIAGNOSIS — M25511 Pain in right shoulder: Secondary | ICD-10-CM | POA: Diagnosis not present

## 2023-06-27 DIAGNOSIS — M47816 Spondylosis without myelopathy or radiculopathy, lumbar region: Secondary | ICD-10-CM | POA: Diagnosis not present

## 2023-07-03 DIAGNOSIS — M47816 Spondylosis without myelopathy or radiculopathy, lumbar region: Secondary | ICD-10-CM | POA: Diagnosis not present

## 2023-07-15 DIAGNOSIS — H2513 Age-related nuclear cataract, bilateral: Secondary | ICD-10-CM | POA: Diagnosis not present

## 2023-07-15 DIAGNOSIS — H401132 Primary open-angle glaucoma, bilateral, moderate stage: Secondary | ICD-10-CM | POA: Diagnosis not present

## 2023-07-15 DIAGNOSIS — H524 Presbyopia: Secondary | ICD-10-CM | POA: Diagnosis not present

## 2023-07-24 DIAGNOSIS — M47816 Spondylosis without myelopathy or radiculopathy, lumbar region: Secondary | ICD-10-CM | POA: Diagnosis not present

## 2023-07-28 DIAGNOSIS — M4726 Other spondylosis with radiculopathy, lumbar region: Secondary | ICD-10-CM | POA: Diagnosis not present

## 2023-07-28 DIAGNOSIS — M47816 Spondylosis without myelopathy or radiculopathy, lumbar region: Secondary | ICD-10-CM | POA: Diagnosis not present

## 2023-07-28 DIAGNOSIS — Z79891 Long term (current) use of opiate analgesic: Secondary | ICD-10-CM | POA: Diagnosis not present

## 2023-07-28 DIAGNOSIS — M25511 Pain in right shoulder: Secondary | ICD-10-CM | POA: Diagnosis not present

## 2023-07-28 DIAGNOSIS — M545 Low back pain, unspecified: Secondary | ICD-10-CM | POA: Diagnosis not present

## 2023-08-03 DIAGNOSIS — M47816 Spondylosis without myelopathy or radiculopathy, lumbar region: Secondary | ICD-10-CM | POA: Diagnosis not present

## 2023-08-11 ENCOUNTER — Other Ambulatory Visit: Payer: Self-pay

## 2023-08-11 DIAGNOSIS — G250 Essential tremor: Secondary | ICD-10-CM

## 2023-08-11 MED ORDER — PRIMIDONE 50 MG PO TABS: 25.0000 mg | ORAL_TABLET | Freq: Two times a day (BID) | ORAL | 0 refills | Status: DC

## 2023-08-12 ENCOUNTER — Encounter (HOSPITAL_COMMUNITY): Payer: Self-pay | Admitting: Emergency Medicine

## 2023-08-12 ENCOUNTER — Other Ambulatory Visit: Payer: Self-pay

## 2023-08-12 ENCOUNTER — Emergency Department (HOSPITAL_COMMUNITY): Payer: Medicare Other

## 2023-08-12 ENCOUNTER — Emergency Department (HOSPITAL_COMMUNITY)
Admission: EM | Admit: 2023-08-12 | Discharge: 2023-08-13 | Disposition: A | Discharge status: Home / Self Care | Payer: Medicare Other | Attending: Emergency Medicine | Admitting: Emergency Medicine

## 2023-08-12 DIAGNOSIS — S61412A Laceration without foreign body of left hand, initial encounter: Secondary | ICD-10-CM

## 2023-08-12 DIAGNOSIS — S61012A Laceration without foreign body of left thumb without damage to nail, initial encounter: Secondary | ICD-10-CM | POA: Diagnosis not present

## 2023-08-12 DIAGNOSIS — S20214A Contusion of middle front wall of thorax, initial encounter: Secondary | ICD-10-CM | POA: Diagnosis not present

## 2023-08-12 DIAGNOSIS — S20219A Contusion of unspecified front wall of thorax, initial encounter: Secondary | ICD-10-CM

## 2023-08-12 DIAGNOSIS — I129 Hypertensive chronic kidney disease with stage 1 through stage 4 chronic kidney disease, or unspecified chronic kidney disease: Secondary | ICD-10-CM | POA: Diagnosis not present

## 2023-08-12 DIAGNOSIS — N189 Chronic kidney disease, unspecified: Secondary | ICD-10-CM | POA: Diagnosis not present

## 2023-08-12 DIAGNOSIS — S20212A Contusion of left front wall of thorax, initial encounter: Secondary | ICD-10-CM | POA: Diagnosis not present

## 2023-08-12 DIAGNOSIS — Y92410 Unspecified street and highway as the place of occurrence of the external cause: Secondary | ICD-10-CM | POA: Diagnosis not present

## 2023-08-12 DIAGNOSIS — I10 Essential (primary) hypertension: Secondary | ICD-10-CM

## 2023-08-12 DIAGNOSIS — Z79899 Other long term (current) drug therapy: Secondary | ICD-10-CM | POA: Insufficient documentation

## 2023-08-12 DIAGNOSIS — I700 Atherosclerosis of aorta: Secondary | ICD-10-CM | POA: Diagnosis not present

## 2023-08-12 DIAGNOSIS — Z041 Encounter for examination and observation following transport accident: Secondary | ICD-10-CM | POA: Diagnosis not present
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
## 2023-08-13 LAB — TROPONIN I (HIGH SENSITIVITY): Troponin I (High Sensitivity): 9 ng/L (ref <18)

## 2023-08-13 MED ORDER — ACETAMINOPHEN 500 MG PO TABS
1000.0000 mg | ORAL_TABLET | Freq: Once | ORAL | Status: AC
  Administered 2023-08-13: 1000 mg via ORAL
  Filled 2023-08-13: qty 2

## 2023-08-13 MED ORDER — LOSARTAN POTASSIUM 50 MG PO TABS
50.0000 mg | ORAL_TABLET | Freq: Once | ORAL | Status: AC
  Administered 2023-08-13: 50 mg via ORAL
  Filled 2023-08-13: qty 1

## 2023-08-13 MED ORDER — AMLODIPINE BESYLATE 5 MG PO TABS
5.0000 mg | ORAL_TABLET | Freq: Once | ORAL | Status: AC
  Administered 2023-08-13: 5 mg via ORAL
  Filled 2023-08-13: qty 1

## 2023-08-13 MED ORDER — LIDOCAINE 5 % EX PTCH
1.0000 | MEDICATED_PATCH | Freq: Once | CUTANEOUS | Status: DC
  Administered 2023-08-13: 2 via TRANSDERMAL
  Filled 2023-08-13: qty 2
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
## 2023-08-13 NOTE — ED Notes (Signed)
 Patient Alert and oriented to baseline. Stable and ambulatory to baseline. Patient verbalized understanding of the discharge instructions.  Patient belongings were taken by the patient.

## 2023-08-13 NOTE — ED Notes (Signed)
EDP aware of elevated BP 
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
## 2023-08-18 DIAGNOSIS — N1831 Chronic kidney disease, stage 3a: Secondary | ICD-10-CM | POA: Diagnosis not present

## 2023-08-18 DIAGNOSIS — S20219A Contusion of unspecified front wall of thorax, initial encounter: Secondary | ICD-10-CM | POA: Diagnosis not present

## 2023-08-18 DIAGNOSIS — I10 Essential (primary) hypertension: Secondary | ICD-10-CM | POA: Diagnosis not present

## 2023-08-18 DIAGNOSIS — R0789 Other chest pain: Secondary | ICD-10-CM | POA: Diagnosis not present

## 2023-08-24 DIAGNOSIS — M47816 Spondylosis without myelopathy or radiculopathy, lumbar region: Secondary | ICD-10-CM | POA: Diagnosis not present

## 2023-08-26 DIAGNOSIS — Z79891 Long term (current) use of opiate analgesic: Secondary | ICD-10-CM | POA: Diagnosis not present

## 2023-08-26 DIAGNOSIS — M47816 Spondylosis without myelopathy or radiculopathy, lumbar region: Secondary | ICD-10-CM | POA: Diagnosis not present

## 2023-09-15 ENCOUNTER — Other Ambulatory Visit: Payer: Self-pay

## 2023-09-15 DIAGNOSIS — G250 Essential tremor: Secondary | ICD-10-CM

## 2023-09-15 DIAGNOSIS — N1831 Chronic kidney disease, stage 3a: Secondary | ICD-10-CM | POA: Diagnosis not present

## 2023-09-15 MED ORDER — PRIMIDONE 50 MG PO TABS: 25.0000 mg | ORAL_TABLET | Freq: Two times a day (BID) | ORAL | 0 refills | Status: DC

## 2023-09-22 ENCOUNTER — Other Ambulatory Visit: Payer: Self-pay | Admitting: *Deleted

## 2023-09-22 DIAGNOSIS — G250 Essential tremor: Secondary | ICD-10-CM

## 2023-09-23 DIAGNOSIS — I129 Hypertensive chronic kidney disease with stage 1 through stage 4 chronic kidney disease, or unspecified chronic kidney disease: Secondary | ICD-10-CM | POA: Diagnosis not present

## 2023-09-23 DIAGNOSIS — N1831 Chronic kidney disease, stage 3a: Secondary | ICD-10-CM | POA: Diagnosis not present

## 2023-09-23 DIAGNOSIS — M47816 Spondylosis without myelopathy or radiculopathy, lumbar region: Secondary | ICD-10-CM | POA: Diagnosis not present

## 2023-09-24 DIAGNOSIS — I739 Peripheral vascular disease, unspecified: Secondary | ICD-10-CM | POA: Diagnosis not present

## 2023-09-24 DIAGNOSIS — G250 Essential tremor: Secondary | ICD-10-CM | POA: Diagnosis not present

## 2023-09-24 DIAGNOSIS — I10 Essential (primary) hypertension: Secondary | ICD-10-CM | POA: Diagnosis not present

## 2023-09-24 DIAGNOSIS — M1990 Unspecified osteoarthritis, unspecified site: Secondary | ICD-10-CM | POA: Diagnosis not present

## 2023-09-24 DIAGNOSIS — R269 Unspecified abnormalities of gait and mobility: Secondary | ICD-10-CM | POA: Diagnosis not present

## 2023-09-24 DIAGNOSIS — E1121 Type 2 diabetes mellitus with diabetic nephropathy: Secondary | ICD-10-CM | POA: Diagnosis not present

## 2023-09-24 DIAGNOSIS — E782 Mixed hyperlipidemia: Secondary | ICD-10-CM | POA: Diagnosis not present

## 2023-09-24 DIAGNOSIS — H409 Unspecified glaucoma: Secondary | ICD-10-CM | POA: Diagnosis not present

## 2023-09-24 DIAGNOSIS — G8929 Other chronic pain: Secondary | ICD-10-CM | POA: Diagnosis not present

## 2023-09-24 DIAGNOSIS — N1831 Chronic kidney disease, stage 3a: Secondary | ICD-10-CM | POA: Diagnosis not present

## 2023-09-24 DIAGNOSIS — R296 Repeated falls: Secondary | ICD-10-CM | POA: Diagnosis not present

## 2023-09-24 DIAGNOSIS — E1140 Type 2 diabetes mellitus with diabetic neuropathy, unspecified: Secondary | ICD-10-CM | POA: Diagnosis not present

## 2023-09-25 DIAGNOSIS — Z79891 Long term (current) use of opiate analgesic: Secondary | ICD-10-CM | POA: Diagnosis not present

## 2023-09-25 DIAGNOSIS — M47816 Spondylosis without myelopathy or radiculopathy, lumbar region: Secondary | ICD-10-CM | POA: Diagnosis not present

## 2023-10-02 ENCOUNTER — Ambulatory Visit: Payer: Medicare HMO | Admitting: Family Medicine

## 2023-10-03 DIAGNOSIS — M47816 Spondylosis without myelopathy or radiculopathy, lumbar region: Secondary | ICD-10-CM | POA: Diagnosis not present

## 2023-10-03 DIAGNOSIS — L97422 Non-pressure chronic ulcer of left heel and midfoot with fat layer exposed: Secondary | ICD-10-CM | POA: Diagnosis not present

## 2023-10-06 ENCOUNTER — Ambulatory Visit (INDEPENDENT_AMBULATORY_CARE_PROVIDER_SITE_OTHER): Payer: Medicare HMO | Admitting: Family Medicine

## 2023-10-06 ENCOUNTER — Encounter: Payer: Self-pay | Admitting: Family Medicine

## 2023-10-06 VITALS — BP 160/82 | HR 85 | Ht 64.0 in | Wt 146.0 lb

## 2023-10-06 DIAGNOSIS — R5383 Other fatigue: Secondary | ICD-10-CM

## 2023-10-06 DIAGNOSIS — M79672 Pain in left foot: Secondary | ICD-10-CM

## 2023-10-06 DIAGNOSIS — M79671 Pain in right foot: Secondary | ICD-10-CM

## 2023-10-06 DIAGNOSIS — G8929 Other chronic pain: Secondary | ICD-10-CM | POA: Diagnosis not present

## 2023-10-06 DIAGNOSIS — M545 Low back pain, unspecified: Secondary | ICD-10-CM

## 2023-10-06 DIAGNOSIS — G250 Essential tremor: Secondary | ICD-10-CM

## 2023-10-06 MED ORDER — PRIMIDONE 50 MG PO TABS: 25.0000 mg | ORAL_TABLET | Freq: Two times a day (BID) | ORAL | 0 refills | Status: DC

## 2023-10-06 NOTE — Progress Notes (Signed)
 Chief Complaint  Patient presents with   Follow-up    Pt in room 1. Boyfriend Jenna Howe. Here for tremor follow up. Pt reports tremor are doing well. Taking primidone. Pt c/o bilateral heel pain, sharp pain. Pt report worsen pain at night while in bed, noticed about 3 weeks ago.     HISTORY OF PRESENT ILLNESS:  10/06/23 ALL:  Jenna Howe returns for follow up for tremor. She was last seen 09/2022 and doing well on primidone 25mg  BID. Since, she reports tremor remains stable. She is tolerating med well with no obvious adverse effects. [name] does report that she seems more tired over the past few years. She is followed for chronic back pain. Not a candidate for surgical intervention. She takes hydrocodone and cyclobenzaprine 2-3 times daily. BP usually 130s/80s.   She is having a sharp pain of both heels for the past 3 weeks. She has open wounds on both heels. Podiatry saw her last week. Abx cream prescribed but she has not picked up yet. Follow up scheduled in 2 weeks.   10/02/2022 ALL:  Jenna Howe returns for follow up for tremor. She continues primidone 25mg  BID. She is tolerating it well. She reports tremor is stable. No significant change since last year. Pharmacy cuts tablet for her. She is having significant back pain followed by PCP. She reports gabapentin caused significant muscle jerks. She is working with PCP to get a walker. She feels BP was elevated due to walking to the back of the office. Initially 190 sys. After visit 140/72. 131/66 at PCP visit, yesterday. Usually runs around 120s/50-60's at home.   09/27/2021 ALL: Jenna Howe returns for follow up for tremor. She continues primidone 25mg  BID. She continues to do very well. Tremor is mild and not limiting. She is tolerating meds well. BP is usually fairly normal at home. She has a log and has appt with PCP today at 2:45. She is feeling well and without concerns.   09/21/2020 ALL:  Jenna Howe is a 83 y.o. female here today for  follow up for tremors. She continues primidone 25mg  twice daily and tolerating well. Tremor is well managed. She notices it most in left hand when anxious or in a hurry. No resting tremor. No difficulty swallowing, She has been having back pain and right sided leg pain. She is followed closely by PCP. She reports BP checked every other day at home and usually 110-130/70's.   HISTORY (copied from Dr Richrd Humbles previous note)  UPDATE (06/21/19, VRP): Since last visit, doing well. Tremors are improved. No new issues. Tolerating primidone 25mg  twice a day.      UPDATE (03/26/17, VRP): Since last visit, doing well. Tolerating primidone 25mg  twice a day. No alleviating or aggravating factors.    UPDATE (03/25/2016, MM): Jenna Howe is a 83 year old female with a history of essential tremor. She returns today for follow-up. She is currently taking primidone 25 mg twice a day. She reports that this controls her tremor quite well. She is able to eat and dress herself without any difficulty. She states that stress is a trigger for her tremor. She feels that her tremor has improved. She denies any new neurological symptoms. She returns today for an evaluation.   UPDATE (03/27/15, MM): Jenna Howe is a 83 year old female with a history of essential tremor. She returns today for follow-up. She is currently taking primidone 25 mg twice a day. She reports that her tremor is under good control. She states that if she  is under a lot of stress or in a rush it'll become more prevalent. The tremor primarily affects the left hand. She denies any new neurological symptoms. She returns today for an evaluation   PRIOR HPI (01/23/11, VRP): 83 year old right-handed female with hypertension, history of cervical laminectomy and discectomy, arthritis, bilateral total knee replacements, here for evaluation of tremor since Jan 2012. The patient reports gradual, progressive tremor of bilateral upper extremities (left greater than right),  mainly when she is doing specific tasks such as applying makeup, putting on earrings, using a spoon or drinking from a glass. She denies any tremor at rest. She denies any new smell or taste difficulties, sleep disturbances or walking problems. She denies any swallowing difficulties. She has a brother with Parkinson's disease. No other family history of tremor   REVIEW OF SYSTEMS: Out of a complete 14 system review of symptoms, the patient complains only of the following symptoms, tremor, back pain, leg swelling, and all other reviewed systems are negative.   ALLERGIES: Allergies  Allergen Reactions   Clinoril [Sulindac] Nausea Only   Gabapentin Hives   Tizanidine Hives   Celebrex [Celecoxib] Nausea Only   Penicillins Rash    Has patient had a PCN reaction causing immediate rash, facial/tongue/throat swelling, SOB or lightheadedness with hypotension:Yes Has patient had a PCN reaction causing severe rash involving mucus membranes or skin necrosis: No Has patient had a PCN reaction that required hospitalization No Has patient had a PCN reaction occurring within the last 10 years: No If all of the above answers are "NO", then may proceed with Cephalosporin use.    Sulfa Antibiotics Rash   Sulfamethoxazole-Trimethoprim Rash   Sulfur Rash     HOME MEDICATIONS: Outpatient Medications Prior to Visit  Medication Sig Dispense Refill   amLODipine (NORVASC) 5 MG tablet Take 5 mg by mouth every evening.     bimatoprost (LUMIGAN) 0.03 % ophthalmic solution Place 1 drop into both eyes in the morning and at bedtime.     BIOTIN 5000 PO Take 5,000 mcg by mouth daily.      bumetanide (BUMEX) 1 MG tablet Take 1 mg by mouth daily.     Calcium Citrate-Vitamin D (CITRACAL + D PO) Take 1 tablet by mouth 2 (two) times daily.     Cholecalciferol (VITAMIN D3) 1000 UNITS CAPS Take 1,000 Units by mouth daily.      cyclobenzaprine (FLEXERIL) 10 MG tablet Take 10 mg by mouth 3 (three) times daily.      dorzolamide-timolol (COSOPT) 2-0.5 % ophthalmic solution Place 1 drop into both eyes 2 (two) times daily.     doxazosin (CARDURA) 4 MG tablet Take 4 mg by mouth 2 (two) times daily.     FARXIGA 10 MG TABS tablet Take 10 mg by mouth daily.     fish oil-omega-3 fatty acids 1000 MG capsule Take 1 capsule by mouth 2 (two) times daily.     HYDROcodone-acetaminophen (NORCO/VICODIN) 5-325 MG tablet Take 1 tablet by mouth every 8 (eight) hours as needed for moderate pain (pain score 4-6).     loratadine (CLARITIN) 10 MG tablet Take 10 mg by mouth daily.     Multiple Vitamin (MULTIVITAMIN) tablet Take 1 tablet by mouth daily.     simvastatin (ZOCOR) 20 MG tablet Take 20 mg by mouth daily at 6 PM.     valsartan (DIOVAN) 320 MG tablet Take 320 mg by mouth daily.     vitamin C (ASCORBIC ACID) 500 MG tablet Take 500 mg  by mouth daily.     primidone (MYSOLINE) 50 MG tablet Take 0.5 tablets (25 mg total) by mouth 2 (two) times daily. 90 tablet 0   No facility-administered medications prior to visit.     PAST MEDICAL HISTORY: Past Medical History:  Diagnosis Date   Anemia    Arthritis    arthritis in knees and hands . Right wrist and thumb splinted due to pain.   Blood transfusion    10/12 after knee surgery    Blood transfusion without reported diagnosis    Essential tremor    Glaucoma    H/O seasonal allergies    sinus congestion   Hx of adenomatous polyp of colon 08/18/2014   Hypertension    Tremor of both hands    mild , tx. Primidone     PAST SURGICAL HISTORY: Past Surgical History:  Procedure Laterality Date   ABDOMINAL HYSTERECTOMY     Vaginal '88   CERVICAL LAMINECTOMY  1995   cervical fusion"some limited ROM"   CHOLECYSTECTOMY     1999   COLONOSCOPY     JOINT REPLACEMENT     right knee 10/12 , left knee 2006    KNEE ARTHROTOMY Right 07/17/2016   Procedure: KNEE ARTHROTOMY with scar excision;  Surgeon: Ollen Gross, MD;  Location: [facility];  Service: Orthopedics;  Laterality:  Right;  spinal+block   KNEE CLOSED REDUCTION  07/10/2011   Procedure: CLOSED MANIPULATION KNEE;  Surgeon: Loanne Drilling;  Location: [facility];  Service: Orthopedics;  Laterality: Right;   OTHER SURGICAL HISTORY     right knee replacement 8/11      FAMILY HISTORY: Family History  Problem Relation Age of Onset   Stroke Mother    Parkinsonism Mother    Diabetes Mother    Colon cancer Neg Hx    Esophageal cancer Neg Hx    Rectal cancer Neg Hx    Stomach cancer Neg Hx      SOCIAL HISTORY: Social History   Socioeconomic History   Marital status: Married    Spouse name: Jenna Howe   Number of children: 1   Years of education: MA   Highest education level: Not on file  Occupational History   Occupation: retired  Tobacco Use   Smoking status: Never   Smokeless tobacco: Former    Types: Chew    Quit date: 05/08/2014  Vaping Use   Vaping status: Never Used  Substance and Sexual Activity   Alcohol use: No    Alcohol/week: 0.0 standard drinks of alcohol   Drug use: No   Sexual activity: Not Currently  Other Topics Concern   Not on file  Social History Narrative   Patient lives at home with her spouse.   Caffeine Use: 1 cup of coffee   Patient is right handed.    Social Drivers of Corporate investment banker Strain: Not on file  Food Insecurity: Not on file  Transportation Needs: Not on file  Physical Activity: Not on file  Stress: Not on file  Social Connections: Not on file  Intimate Partner Violence: Not on file      PHYSICAL EXAM  Vitals:   10/06/23 1433 10/06/23 1440  BP: (!) 183/82 (!) 160/82  Pulse: 85   Weight: 146 lb (66.2 kg)   Height: 5\' 4"  (1.626 m)       Body mass index is 25.06 kg/m.   Generalized: Well developed, in no acute distress  Cardiology: normal rate and rhythm, no murmur  auscultated  Respiratory: clear to auscultation bilaterally    Neurological examination  Mentation: Alert oriented to time, place, history taking. Follows all  commands speech and language fluent Cranial nerve II-XII: Pupils were equal round reactive to light. Extraocular movements were full, visual field were full on confrontational test. Facial sensation and strength were normal. Head turning and shoulder shrug  were normal and symmetric. Motor: The motor testing reveals 5 over 5 strength of all 4 extremities. Good symmetric motor tone is noted throughout.  Gait and station: Gait is arthritic, stooped posture. Uses single prong cane for stability.   DIAGNOSTIC DATA (LABS, IMAGING, TESTING) - I reviewed patient records, labs, notes, testing and imaging myself where available.  Lab Results  Component Value Date   WBC 7.6 02/20/2023   HGB 11.1 (L) 02/20/2023   HCT 35.5 (L) 02/20/2023   MCV 71.7 (L) 02/20/2023   PLT 263 02/20/2023      Component Value Date/Time   NA 141 07/11/2016 1426   K 3.7 07/11/2016 1426   CL 101 07/11/2016 1426   CO2 32 07/11/2016 1426   GLUCOSE 116 (H) 07/11/2016 1426   BUN 15 07/11/2016 1426   CREATININE 0.98 07/17/2016 1923   CALCIUM 8.8 (L) 07/11/2016 1426   PROT 7.8 04/26/2011 1220   ALBUMIN 4.0 04/26/2011 1220   AST 23 04/26/2011 1220   ALT 15 04/26/2011 1220   ALKPHOS 75 04/26/2011 1220   BILITOT 0.2 (L) 04/26/2011 1220   GFRNONAA 55 (L) 07/17/2016 1923   GFRAA >60 07/17/2016 1923   No results found for: "CHOL", "HDL", "LDLCALC", "LDLDIRECT", "TRIG", "CHOLHDL" No results found for: "HGBA1C" No results found for: "VITAMINB12" No results found for: "TSH"      No data to display               No data to display           ASSESSMENT AND PLAN  83 y.o. year old female  has a past medical history of Anemia, Arthritis, Blood transfusion, Blood transfusion without reported diagnosis, Essential tremor, Glaucoma, H/O seasonal allergies, adenomatous polyp of colon (08/18/2014), Hypertension, and Tremor of both hands. here with   Essential tremor - Plan: Primidone level, primidone (MYSOLINE) 50 MG  tablet  Heel pain, bilateral  Other fatigue  Chronic bilateral low back pain, unspecified whether sciatica present  Jenna Howe is doing well, today. She will continue primidone 25mg  twice daily. She will continue close follow up with Dr Donette Larry for comorbidity management and labs. Healthy lifestyle habits encouraged. She may follow up with PCP for refills and see [place] as needed, however, she prefers to schedule with [place] for 1 year. She is aware of and understands plan.   Orders Placed This Encounter  Procedures   Primidone level     Meds ordered this encounter  Medications   primidone (MYSOLINE) 50 MG tablet    Sig: Take 0.5 tablets (25 mg total) by mouth 2 (two) times daily.    Dispense:  90 tablet    Refill:  0    Patient has a hard time cutting pill in half but 25mg  dose not available. Could pharmacy staff cut tablet for her?    Supervising Provider:   Anson Fret [[id number]]     I spent 30 minutes of face-to-face and non-face-to-face time with patient.  This included previsit chart review, lab review, study review, order entry, electronic health record documentation, patient education.   Shawnie Dapper, MSN, FNP-C 10/06/2023, 3:20 PM  [facility] [address], [address] [place], [place] [address] [phone]

## 2023-10-06 NOTE — Patient Instructions (Signed)
Below is our plan:  We will continue primidone 25mg  twice daily   Please make sure you are staying well hydrated. I recommend 50-60 ounces daily. Well balanced diet and regular exercise encouraged. Consistent sleep schedule with 6-8 hours recommended.   Please continue follow up with care team as directed.   Follow up with me in 1 year   You may receive a survey regarding today's visit. I encourage you to leave honest feed back as I do use this information to improve patient care. Thank you for seeing me today!

## 2023-10-08 ENCOUNTER — Encounter: Payer: Self-pay | Admitting: Family Medicine

## 2023-10-08 LAB — PRIMIDONE, SERUM
Phenobarbital, Serum: 4 ug/mL — ABNORMAL LOW (ref 15–40)
Primidone Lvl: 2.7 ug/mL — ABNORMAL LOW (ref 5.0–12.0)

## 2023-10-16 DIAGNOSIS — L97422 Non-pressure chronic ulcer of left heel and midfoot with fat layer exposed: Secondary | ICD-10-CM | POA: Diagnosis not present

## 2023-10-23 DIAGNOSIS — L97422 Non-pressure chronic ulcer of left heel and midfoot with fat layer exposed: Secondary | ICD-10-CM | POA: Diagnosis not present

## 2023-10-24 DIAGNOSIS — M47816 Spondylosis without myelopathy or radiculopathy, lumbar region: Secondary | ICD-10-CM | POA: Diagnosis not present

## 2023-10-28 DIAGNOSIS — Z79891 Long term (current) use of opiate analgesic: Secondary | ICD-10-CM | POA: Diagnosis not present

## 2023-10-28 DIAGNOSIS — M47816 Spondylosis without myelopathy or radiculopathy, lumbar region: Secondary | ICD-10-CM | POA: Diagnosis not present

## 2023-10-30 DIAGNOSIS — M7989 Other specified soft tissue disorders: Secondary | ICD-10-CM | POA: Diagnosis not present

## 2023-10-30 DIAGNOSIS — I10 Essential (primary) hypertension: Secondary | ICD-10-CM | POA: Diagnosis not present

## 2023-11-23 DIAGNOSIS — M47816 Spondylosis without myelopathy or radiculopathy, lumbar region: Secondary | ICD-10-CM | POA: Diagnosis not present

## 2023-11-25 ENCOUNTER — Ambulatory Visit (INDEPENDENT_AMBULATORY_CARE_PROVIDER_SITE_OTHER)

## 2023-11-25 ENCOUNTER — Encounter: Payer: Self-pay | Admitting: Podiatry

## 2023-11-25 ENCOUNTER — Ambulatory Visit (INDEPENDENT_AMBULATORY_CARE_PROVIDER_SITE_OTHER): Admitting: Podiatry

## 2023-11-25 DIAGNOSIS — L97402 Non-pressure chronic ulcer of unspecified heel and midfoot with fat layer exposed: Secondary | ICD-10-CM

## 2023-11-25 DIAGNOSIS — M47816 Spondylosis without myelopathy or radiculopathy, lumbar region: Secondary | ICD-10-CM | POA: Diagnosis not present

## 2023-11-25 DIAGNOSIS — M778 Other enthesopathies, not elsewhere classified: Secondary | ICD-10-CM

## 2023-11-25 NOTE — Progress Notes (Signed)
 Physical Exam:  Patient alert and oriented x 3.  No complaints of nausea, vomiting, fever, or chills  Vascular: DP pulses bilaterally. PT pulses nonpalpable bilaterally.  Moderate edema. Capillary fill time immediate.  Dermatologic: This ulcer penetrating the subcutaneous tissue ulcer posterior heel left foot foot. Measures 20 mm wide x 11 mm long x 4 deep.  Minimal clear drainage.  No erythema.  No undermining.  3 to 4 mm margin around periphery of ulcer of granulation tissue with eschar in the center.  Neurologic: Pain sensation intact  Musculoskeletal: Tenderness posterior heel left  Radiographs: 3 views left foot: Osteophytic changes plantar and posterior aspect of the heel.  No evidence of any bone density changes or radiolucencies noted.  Generalized osteopenia in the foot.  No evidence of any loss of cortical definition.  No evidence of bone tumors.  Diagnoses: Deep full-thickness ulceration penetrating the subcutaneous tissue ulcer posterior heel left.  Plan: -Ulcer is improving slowly.  In size compared to last visit.  No signs of infection today. - Sharp debridement of full-thickness ulcer into the subcutaneous tissue posterior heel left.  Debrided any devitalized tissue to a good bleeding base.  Applied triple antibiotic ointment and a gauze dressing. - Wound care: Apply Santyl daily after warm water soaks for 15 minutes.  Apply gauze dressing over this. - Minimize weightbearing and be careful to keep pressure off of the heel when sitting or lying down.    Return 2 weeks f/u ulcer

## 2023-12-10 ENCOUNTER — Ambulatory Visit: Admitting: Podiatry

## 2023-12-15 ENCOUNTER — Ambulatory Visit: Admitting: Podiatry

## 2023-12-16 ENCOUNTER — Encounter: Payer: Self-pay | Admitting: Podiatry

## 2023-12-16 ENCOUNTER — Ambulatory Visit (INDEPENDENT_AMBULATORY_CARE_PROVIDER_SITE_OTHER): Admitting: Podiatry

## 2023-12-16 DIAGNOSIS — L97522 Non-pressure chronic ulcer of other part of left foot with fat layer exposed: Secondary | ICD-10-CM

## 2023-12-16 NOTE — Progress Notes (Signed)
 Patient presents follow-up ulcer posterior heel left.  Still having pain with it.  Has been soaking once a day and putting Santyl and dressing on it.  No F/C or N/V.  Has not noticed any purulent drainage or signs of infection.  Physical Exam:  Patient alert and oriented x 3.  No complaints of nausea, vomiting, fever, or chills  Vascular: DP pulses 0/4 bilateral. PT pulses 0/4 lateral.  Moderate edema. Capillary fill time immediate.  Dermatologic: Full-thickness ulcer penetrating the subcutaneous tissue posterior heel left. . Measures 7mm wide x 16 mm long x 5 deep.  Clear drainage.  No no erythema.  Moderate exudate.  No undermining.  Granulation around the periphery of the ulcer with some loose eschar centrally  Neurologic:   Musculoskeletal: Tenderness posterior heel left  Radiographs: None  Diagnoses: 1.  Full-thickness ulcer penetrating into the subcutaneous tissue posterior heel left.  Ulcer continues to improve  Plan: -Sharp debridement full-thickness ulceration and subcutaneous tissue posterior heel left.  Sharply debrided any devitalized tissue down to bleeding base.  Applied antibiotic ointment and dressing today. - Continue wound care: Apply Santyl ointment daily after soaking for 15 minutes Epsom salt water.  Apply light dressing.  Wearing surgical shoe    Return to f/u ulcer

## 2023-12-16 NOTE — Patient Instructions (Signed)
 He is ready to proceed with dressing

## 2023-12-18 DIAGNOSIS — H25811 Combined forms of age-related cataract, right eye: Secondary | ICD-10-CM | POA: Diagnosis not present

## 2023-12-18 DIAGNOSIS — H2522 Age-related cataract, morgagnian type, left eye: Secondary | ICD-10-CM | POA: Diagnosis not present

## 2023-12-18 DIAGNOSIS — H401132 Primary open-angle glaucoma, bilateral, moderate stage: Secondary | ICD-10-CM | POA: Diagnosis not present

## 2023-12-24 DIAGNOSIS — M47816 Spondylosis without myelopathy or radiculopathy, lumbar region: Secondary | ICD-10-CM | POA: Diagnosis not present

## 2023-12-29 ENCOUNTER — Ambulatory Visit (INDEPENDENT_AMBULATORY_CARE_PROVIDER_SITE_OTHER): Admitting: Podiatry

## 2023-12-29 DIAGNOSIS — L97402 Non-pressure chronic ulcer of unspecified heel and midfoot with fat layer exposed: Secondary | ICD-10-CM | POA: Diagnosis not present

## 2023-12-29 NOTE — Progress Notes (Signed)
 Patient presents follow-up ulceration posterior heel left.  Has not noticed any increase in pain or drainage.  They have been doing wound care as instructed.  No fever or chills or nausea or vomiting.  Physical Exam:  Patient alert and oriented x 3.  No complaints of nausea, vomiting, fever, or chills  Vascular: DP pulses 0/4 bilateral. PT pulses 0/4 lateral.  Mild edema left foot. Capillary fill time immediate.  Dermatologic: Deep full-thickness ulceration posterior aspect heel left penetrating into the subcutaneous tissue  . Measures 11 mm wide x 7 mm long x 4 deep.  Clear drainage.  No erythema.  Moderate exudate.  No undermining.  Increased granulation tissue at base of ulcer.  Pain at ulcer site.  Neurologic:   Musculoskeletal:   Diagnoses: 1.  Full-thickness ulcer penetrating subcutaneous tissue posterior heel left.  Plan: -Ulcer is improved.  Is about the same size however the ulcer appears much happier and healthier with decreased fibrous tissue and increased granulation tissue.  Seems to be responding to the Santyl ointment well -Sharp debridement full-thickness ulceration with subcutaneous tissue posterior aspect heel left.  Debrided any devitalized tissue.  Good bleeding upon debridement.  Applied antibiotic ointment and light dressing.   Return 2 weeks f/u ulcer and radiographs 3 views left foot

## 2023-12-30 DIAGNOSIS — M47816 Spondylosis without myelopathy or radiculopathy, lumbar region: Secondary | ICD-10-CM | POA: Diagnosis not present

## 2024-01-12 ENCOUNTER — Other Ambulatory Visit: Payer: Self-pay

## 2024-01-12 ENCOUNTER — Ambulatory Visit (INDEPENDENT_AMBULATORY_CARE_PROVIDER_SITE_OTHER): Admitting: Podiatry

## 2024-01-12 ENCOUNTER — Ambulatory Visit (INDEPENDENT_AMBULATORY_CARE_PROVIDER_SITE_OTHER)

## 2024-01-12 DIAGNOSIS — G250 Essential tremor: Secondary | ICD-10-CM

## 2024-01-12 DIAGNOSIS — L97402 Non-pressure chronic ulcer of unspecified heel and midfoot with fat layer exposed: Secondary | ICD-10-CM

## 2024-01-12 DIAGNOSIS — L97522 Non-pressure chronic ulcer of other part of left foot with fat layer exposed: Secondary | ICD-10-CM | POA: Diagnosis not present

## 2024-01-12 DIAGNOSIS — M7732 Calcaneal spur, left foot: Secondary | ICD-10-CM

## 2024-01-12 MED ORDER — PRIMIDONE 50 MG PO TABS: 25.0000 mg | ORAL_TABLET | Freq: Two times a day (BID) | ORAL | 0 refills | Status: DC

## 2024-01-12 NOTE — Progress Notes (Signed)
 Today follow-up ulceration posterior left.  Still having pain.  Husband is with her says seems to look a little bit better.  Still doing wound care as instructed per patient.  No F/C or N/V.  Physical Exam:  Patient alert and oriented x 3.  No complaints of nausea, vomiting, fever, or chills  Vascular: DP pulses 0/4 bilateral. PT pulses 0/4 lateral. Capillary fill time immediate bilaterally.  Dermatologic: Full-thickness ulcer penetrating the subcutaneous tissue posterior aspect left heel. Measures 11mm wide x 6 mm long x 4 deep.  Clear drainage.  No erythema.  Moderate exudate.  Mild undermining.  Full base of granulation tissue at ulcer base.  No signs of infection.  Tenderness at ulcer  Neurologic:   Musculoskeletal: Tenderness posterior heel left  Radiographs: 3 views foot left: Osteophytic changes plantar and posterior aspect calcaneus.  Cortical margins are intact with no evidence of osteomyelitis.  No subcutaneous emphysema noted.  Some generalized osteoporosis foot left  Diagnoses: 1.  Full-thickness ulcer heel posterior left.  Some improvement.. 2.  Calcaneal spur left  Plan: -Sharp debridement full-thickness ulceration posterior aspect heel left.  Sharply debrided and devitalized tissue down to subcutaneous tissue and good bleeding base.  Applied antibiotic ointment and a light dressing. - Wound care: Nickens continue soaking twice daily warm Epsom salt water applying Bactroban ointment and a light dressing. -Offload is much as possible   Return 2 weeks f/u ulcer

## 2024-01-16 ENCOUNTER — Other Ambulatory Visit: Payer: Self-pay | Admitting: Internal Medicine

## 2024-01-16 DIAGNOSIS — Z1231 Encounter for screening mammogram for malignant neoplasm of breast: Secondary | ICD-10-CM

## 2024-01-19 ENCOUNTER — Ambulatory Visit
Admission: RE | Admit: 2024-01-19 | Discharge: 2024-01-19 | Disposition: A | Discharge status: Home / Self Care | Source: Ambulatory Visit | Attending: Internal Medicine | Admitting: Internal Medicine

## 2024-01-19 ENCOUNTER — Other Ambulatory Visit: Payer: Self-pay | Admitting: Internal Medicine

## 2024-01-19 DIAGNOSIS — M549 Dorsalgia, unspecified: Secondary | ICD-10-CM | POA: Diagnosis not present

## 2024-01-19 DIAGNOSIS — I10 Essential (primary) hypertension: Secondary | ICD-10-CM | POA: Diagnosis not present

## 2024-01-19 DIAGNOSIS — M1611 Unilateral primary osteoarthritis, right hip: Secondary | ICD-10-CM | POA: Diagnosis not present

## 2024-01-19 DIAGNOSIS — H269 Unspecified cataract: Secondary | ICD-10-CM | POA: Diagnosis not present

## 2024-01-19 DIAGNOSIS — G8929 Other chronic pain: Secondary | ICD-10-CM | POA: Diagnosis not present

## 2024-01-19 DIAGNOSIS — M25551 Pain in right hip: Secondary | ICD-10-CM

## 2024-01-19 DIAGNOSIS — R269 Unspecified abnormalities of gait and mobility: Secondary | ICD-10-CM | POA: Diagnosis not present

## 2024-01-22 DIAGNOSIS — H268 Other specified cataract: Secondary | ICD-10-CM | POA: Diagnosis not present

## 2024-01-22 DIAGNOSIS — Z961 Presence of intraocular lens: Secondary | ICD-10-CM | POA: Diagnosis not present

## 2024-01-22 DIAGNOSIS — H25812 Combined forms of age-related cataract, left eye: Secondary | ICD-10-CM | POA: Diagnosis not present

## 2024-01-22 DIAGNOSIS — H2522 Age-related cataract, morgagnian type, left eye: Secondary | ICD-10-CM | POA: Diagnosis not present

## 2024-01-23 DIAGNOSIS — M47816 Spondylosis without myelopathy or radiculopathy, lumbar region: Secondary | ICD-10-CM | POA: Diagnosis not present

## 2024-01-27 ENCOUNTER — Encounter: Payer: Self-pay | Admitting: Podiatry

## 2024-01-27 ENCOUNTER — Ambulatory Visit (INDEPENDENT_AMBULATORY_CARE_PROVIDER_SITE_OTHER): Admitting: Podiatry

## 2024-01-27 VITALS — BP 132/84 | HR 64 | Temp 97.3°F | Resp 18 | Ht 64.0 in | Wt 146.0 lb

## 2024-01-27 DIAGNOSIS — L97402 Non-pressure chronic ulcer of unspecified heel and midfoot with fat layer exposed: Secondary | ICD-10-CM

## 2024-01-27 NOTE — Progress Notes (Signed)
 Patient presents follow-up ulcer has been doing wound care as instructed.  Having less pain than before.  No F/C or N/V  Physical Exam:  Patient alert and oriented x 3.  No complaints of nausea, vomiting, fever, or chills  Vascular: DP pulses 0/4 bilateral. PT pulses 0/4 lateral.  Moderate edema lower extremity bilaterally. Capillary fill time immediate.  Dermatologic: Full-thickness ulcer penetrating subcutaneous tissue posterior aspect heel left with a good base of granulation tissue and no signs of infection.  Wound appears much more vascular today.. Measures 9 mm wide x 4 mm long x 3 deep.  Less tenderness.  Neurologic:   Musculoskeletal:     Diagnoses: 1.  Full-thickness ulcer Wagner grade 2 penetrating the subcutaneous tissue posterior aspect heel left-improved.  Plan: -Discussed with the ulcer is definitely improving.  Much less dysvascular than before wound has a nice base of granulation tissue and wounds wound bled readily upon debridement.  -Sharp debridement full-thickness ulceration and subcutaneous tissue or posterior aspect heel left.  Sharply debrided any devitalized tissue with good bleeding base.  Applied antibiotic ointment and a light dressing.  Return 2 weeks f/u ulcer heel left

## 2024-01-28 DIAGNOSIS — M47816 Spondylosis without myelopathy or radiculopathy, lumbar region: Secondary | ICD-10-CM | POA: Diagnosis not present

## 2024-02-05 ENCOUNTER — Ambulatory Visit
Admission: RE | Admit: 2024-02-05 | Discharge: 2024-02-05 | Disposition: A | Discharge status: Home / Self Care | Source: Ambulatory Visit | Attending: Internal Medicine | Admitting: Internal Medicine

## 2024-02-05 DIAGNOSIS — Z1231 Encounter for screening mammogram for malignant neoplasm of breast: Secondary | ICD-10-CM | POA: Diagnosis not present

## 2024-02-10 ENCOUNTER — Ambulatory Visit: Admitting: Podiatry

## 2024-02-17 ENCOUNTER — Encounter: Payer: Self-pay | Admitting: Podiatry

## 2024-02-17 ENCOUNTER — Ambulatory Visit (INDEPENDENT_AMBULATORY_CARE_PROVIDER_SITE_OTHER): Admitting: Podiatry

## 2024-02-17 DIAGNOSIS — L97422 Non-pressure chronic ulcer of left heel and midfoot with fat layer exposed: Secondary | ICD-10-CM | POA: Diagnosis not present

## 2024-02-17 NOTE — Progress Notes (Signed)
 Of ulceration heel left.  Doing much better not nearly as tender as before.  Says she is getting less drainage that is clear.  Has not noticed any purulence or signs of infection.  No fever or chills or nausea or vomiting.  Physical Exam:  Patient alert and oriented x 3.  No complaints of nausea, vomiting, fever, or chills  Vascular: DP pulses 0/4 bilateral. PT pulses 0/4 lateral.  Moderate edema bilaterally. Capillary fill time immediate..  Dermatologic: Full-thickness ulcer plantar posterior aspect heel left.  Mild to moderate clear drainage.  No signs of infection.  Some granulation tissue is present.  Minimal tenderness today.. Measures 8 mm wide x 4 mm long x 2.5 deep.   Neurologic:   Musculoskeletal:    Diagnoses: 1.  Full-thickness Wagner grade 2 ulceration posterior plantar heel left.-Some improvement  Plan: -Ulcer continues to slowly improve.  Tenderness is much decreased compared to previous visits. -Continue soaking twice daily warm Epsom salt water, apply Bactroban ointment, and a light dressing. - Sharp debridement full-thickness ulceration into subcutaneous tissue posterior aspect heel left.  sharply debrided and devitalized tissue to a good bleeding base.  Applied antibiotic ointment and a light dressing.   Return 2 weeks f/u ulcer left

## 2024-02-23 DIAGNOSIS — M47816 Spondylosis without myelopathy or radiculopathy, lumbar region: Secondary | ICD-10-CM | POA: Diagnosis not present

## 2024-02-26 DIAGNOSIS — M25551 Pain in right hip: Secondary | ICD-10-CM | POA: Diagnosis not present

## 2024-02-26 DIAGNOSIS — R269 Unspecified abnormalities of gait and mobility: Secondary | ICD-10-CM | POA: Diagnosis not present

## 2024-02-26 DIAGNOSIS — R0781 Pleurodynia: Secondary | ICD-10-CM | POA: Diagnosis not present

## 2024-02-26 DIAGNOSIS — S2231XD Fracture of one rib, right side, subsequent encounter for fracture with routine healing: Secondary | ICD-10-CM | POA: Diagnosis not present

## 2024-03-02 ENCOUNTER — Ambulatory Visit (INDEPENDENT_AMBULATORY_CARE_PROVIDER_SITE_OTHER): Admitting: Podiatry

## 2024-03-02 ENCOUNTER — Encounter: Payer: Self-pay | Admitting: Podiatry

## 2024-03-02 VITALS — Ht 64.0 in | Wt 146.0 lb

## 2024-03-02 DIAGNOSIS — L97422 Non-pressure chronic ulcer of left heel and midfoot with fat layer exposed: Secondary | ICD-10-CM | POA: Diagnosis not present

## 2024-03-02 DIAGNOSIS — L97412 Non-pressure chronic ulcer of right heel and midfoot with fat layer exposed: Secondary | ICD-10-CM

## 2024-03-02 NOTE — Progress Notes (Signed)
 Patient presents with complaint of painful ulcer plantar posterior heel left.  Pain has gotten much less than it was before.  Still getting a little bit of drainage.  She has been doing the wound care as instructed  Physical Exam:  Patient alert and oriented x 3.  No complaints of nausea, vomiting, fever, or chills  Vascular: DP pulses 0/4 bilateral. PT pulses 0/4 lateral. mODERATE edema. Capillary fill time immediate left.  Dermatologic: Full-thickness ulceration penetrating through the dermis into the subcutaneous tissue posterior plantar medial left.  Mild clear drainage.  No signs of infection.  Predebridement ulcer measures 2mm wide x 1 mm long x 2 deep.  Less tender than previous visit  Neurologic:   Musculoskeletal:   Diagnoses: 1.  Full-thickness Wagner grade 2 ulcer plantar posterior aspect heel left-improved.  Plan: -Continue wound care soaking twice daily warm Epsom salt water for 15 minutes, apply Bactroban ointment, and light dressing. -Sharp debridement full-thickness ulceration into subcutaneous tissue posterior plantar aspect heel left.  Sharply debrided any devitalized tissue with a 312 blade and tissue nipper.  Debrided and devitalized tissue.  Minimal bleeding.  Postdebridement ulcer measures 4 mm x 2 mm x 3 mm deep applied antibiotic dressing.   Return 2 weeks follow-up ulcer f/u ulcer heel left

## 2024-03-04 DIAGNOSIS — M47816 Spondylosis without myelopathy or radiculopathy, lumbar region: Secondary | ICD-10-CM | POA: Diagnosis not present

## 2024-03-04 DIAGNOSIS — S2231XS Fracture of one rib, right side, sequela: Secondary | ICD-10-CM | POA: Diagnosis not present

## 2024-03-05 DIAGNOSIS — H35352 Cystoid macular degeneration, left eye: Secondary | ICD-10-CM | POA: Diagnosis not present

## 2024-03-16 ENCOUNTER — Ambulatory Visit (INDEPENDENT_AMBULATORY_CARE_PROVIDER_SITE_OTHER): Admitting: Podiatry

## 2024-03-16 ENCOUNTER — Encounter: Payer: Self-pay | Admitting: Podiatry

## 2024-03-16 DIAGNOSIS — M7662 Achilles tendinitis, left leg: Secondary | ICD-10-CM

## 2024-03-16 NOTE — Progress Notes (Signed)
 Patient doing much better much less pain at the heel.  Says he has some pain along the Achilles tendon more proximal to where the ulcer was.   Physical exam:  General appearance: Pleasant, and in no acute distress. AOx3.  Vascular: Pedal pulses: DP 0/4 bilaterally, PT 0/4 bilaterally. Moderate edema lower legs bilaterally. Capillary fill time immediate bilaterally.  Neurological: Grossly intact bilaterally  Dermatologic:   Ulcer healed with no signs of infection and decreased tenderness.  Skin normal temperature bilaterally.  Skin normal color, tone, and texture bilaterally.   Musculoskeletal: Some soreness in the Achilles tendon left about 3 cm from the insertion point.  Defects in Achilles tendon left noted.   Diagnosis: 1.  Achilles tendinitis left  Plan: -Establish office visit for evaluation and management level 3. -She can discontinue wound care on the ulcer is healed.  Recommended good supportive shoes and thick socks to protect the area.  Also recommended to keep the skin well moisturized with moisturizing agent and something greasy like Vaseline or Aquaphor. -Wear good supportive shoes to take stress off the tendon.  I think a lot of this will go away since the pain in the back of the heel at the ulcer site is gone and she will be splinting anymore.   Return as needed

## 2024-03-24 ENCOUNTER — Other Ambulatory Visit: Payer: Self-pay | Admitting: *Deleted

## 2024-03-24 DIAGNOSIS — G250 Essential tremor: Secondary | ICD-10-CM

## 2024-03-24 MED ORDER — PRIMIDONE 50 MG PO TABS: 25.0000 mg | ORAL_TABLET | Freq: Two times a day (BID) | ORAL | 0 refills | Status: AC

## 2024-03-24 NOTE — Telephone Encounter (Signed)
 Last seen on 10/06/23 No 1 year follow up scheduled yet.

## 2024-03-25 DIAGNOSIS — H25811 Combined forms of age-related cataract, right eye: Secondary | ICD-10-CM | POA: Diagnosis not present

## 2024-03-25 DIAGNOSIS — H2511 Age-related nuclear cataract, right eye: Secondary | ICD-10-CM | POA: Diagnosis not present

## 2024-03-31 DIAGNOSIS — Z79891 Long term (current) use of opiate analgesic: Secondary | ICD-10-CM | POA: Diagnosis not present

## 2024-03-31 DIAGNOSIS — M47816 Spondylosis without myelopathy or radiculopathy, lumbar region: Secondary | ICD-10-CM | POA: Diagnosis not present

## 2024-03-31 DIAGNOSIS — S2231XS Fracture of one rib, right side, sequela: Secondary | ICD-10-CM | POA: Diagnosis not present

## 2024-04-02 DIAGNOSIS — H35352 Cystoid macular degeneration, left eye: Secondary | ICD-10-CM | POA: Diagnosis not present

## 2024-04-14 DIAGNOSIS — H409 Unspecified glaucoma: Secondary | ICD-10-CM | POA: Diagnosis not present

## 2024-04-14 DIAGNOSIS — I10 Essential (primary) hypertension: Secondary | ICD-10-CM | POA: Diagnosis not present

## 2024-04-14 DIAGNOSIS — E1121 Type 2 diabetes mellitus with diabetic nephropathy: Secondary | ICD-10-CM | POA: Diagnosis not present

## 2024-04-14 DIAGNOSIS — G8929 Other chronic pain: Secondary | ICD-10-CM | POA: Diagnosis not present

## 2024-04-14 DIAGNOSIS — Z23 Encounter for immunization: Secondary | ICD-10-CM | POA: Diagnosis not present

## 2024-04-14 DIAGNOSIS — E782 Mixed hyperlipidemia: Secondary | ICD-10-CM | POA: Diagnosis not present

## 2024-04-14 DIAGNOSIS — Z0000 Encounter for general adult medical examination without abnormal findings: Secondary | ICD-10-CM | POA: Diagnosis not present

## 2024-04-14 DIAGNOSIS — I739 Peripheral vascular disease, unspecified: Secondary | ICD-10-CM | POA: Diagnosis not present

## 2024-04-14 DIAGNOSIS — N1831 Chronic kidney disease, stage 3a: Secondary | ICD-10-CM | POA: Diagnosis not present

## 2024-04-14 DIAGNOSIS — E1140 Type 2 diabetes mellitus with diabetic neuropathy, unspecified: Secondary | ICD-10-CM | POA: Diagnosis not present

## 2024-04-14 DIAGNOSIS — G250 Essential tremor: Secondary | ICD-10-CM | POA: Diagnosis not present

## 2024-04-24 DIAGNOSIS — M47816 Spondylosis without myelopathy or radiculopathy, lumbar region: Secondary | ICD-10-CM | POA: Diagnosis not present

## 2024-04-27 DIAGNOSIS — M47816 Spondylosis without myelopathy or radiculopathy, lumbar region: Secondary | ICD-10-CM | POA: Diagnosis not present

## 2024-05-04 DIAGNOSIS — H35353 Cystoid macular degeneration, bilateral: Secondary | ICD-10-CM | POA: Diagnosis not present
